# Patient Record
Sex: Female | Born: 1960
Health system: Southern US, Community
[De-identification: ages and names within clinical notes are randomized; demographics above are authoritative.]

## PROBLEM LIST (undated history)

## (undated) DIAGNOSIS — E785 Hyperlipidemia, unspecified: Secondary | ICD-10-CM

## (undated) DIAGNOSIS — J80 Acute respiratory distress syndrome: Secondary | ICD-10-CM

## (undated) HISTORY — PX: ABDOMINAL HYSTERECTOMY: SUR658

## (undated) HISTORY — DX: Hyperlipidemia, unspecified: E78.5

## (undated) HISTORY — DX: Acute respiratory distress syndrome: J80

## (undated) HISTORY — PX: CHOLECYSTECTOMY: SHX55

## (undated) HISTORY — PX: TONSILLECTOMY: SUR1361

---

## 2010-01-07 ENCOUNTER — Ambulatory Visit (HOSPITAL_COMMUNITY): Admission: RE | Admit: 2010-01-07 | Discharge: 2010-01-07 | Payer: Self-pay | Admitting: Internal Medicine

## 2015-09-29 DIAGNOSIS — M545 Low back pain: Secondary | ICD-10-CM | POA: Diagnosis not present

## 2015-09-29 DIAGNOSIS — J069 Acute upper respiratory infection, unspecified: Secondary | ICD-10-CM | POA: Diagnosis not present

## 2015-10-29 DIAGNOSIS — Z720 Tobacco use: Secondary | ICD-10-CM | POA: Diagnosis not present

## 2015-10-29 DIAGNOSIS — M545 Low back pain: Secondary | ICD-10-CM | POA: Diagnosis not present

## 2015-10-29 DIAGNOSIS — R350 Frequency of micturition: Secondary | ICD-10-CM | POA: Diagnosis not present

## 2015-10-29 DIAGNOSIS — N390 Urinary tract infection, site not specified: Secondary | ICD-10-CM | POA: Diagnosis not present

## 2015-10-29 DIAGNOSIS — F329 Major depressive disorder, single episode, unspecified: Secondary | ICD-10-CM | POA: Diagnosis not present

## 2015-11-05 DIAGNOSIS — F329 Major depressive disorder, single episode, unspecified: Secondary | ICD-10-CM | POA: Diagnosis not present

## 2015-11-07 DIAGNOSIS — Z713 Dietary counseling and surveillance: Secondary | ICD-10-CM | POA: Diagnosis not present

## 2015-11-07 DIAGNOSIS — R319 Hematuria, unspecified: Secondary | ICD-10-CM | POA: Diagnosis not present

## 2015-11-07 DIAGNOSIS — F419 Anxiety disorder, unspecified: Secondary | ICD-10-CM | POA: Diagnosis not present

## 2015-11-07 DIAGNOSIS — R350 Frequency of micturition: Secondary | ICD-10-CM | POA: Diagnosis not present

## 2015-11-07 DIAGNOSIS — Z6821 Body mass index (BMI) 21.0-21.9, adult: Secondary | ICD-10-CM | POA: Diagnosis not present

## 2015-11-07 DIAGNOSIS — R311 Benign essential microscopic hematuria: Secondary | ICD-10-CM | POA: Diagnosis not present

## 2015-11-24 DIAGNOSIS — R319 Hematuria, unspecified: Secondary | ICD-10-CM | POA: Diagnosis not present

## 2015-11-24 DIAGNOSIS — N200 Calculus of kidney: Secondary | ICD-10-CM | POA: Diagnosis not present

## 2015-11-28 DIAGNOSIS — M545 Low back pain: Secondary | ICD-10-CM | POA: Diagnosis not present

## 2015-11-28 DIAGNOSIS — R350 Frequency of micturition: Secondary | ICD-10-CM | POA: Diagnosis not present

## 2015-12-26 DIAGNOSIS — F17210 Nicotine dependence, cigarettes, uncomplicated: Secondary | ICD-10-CM | POA: Diagnosis not present

## 2015-12-26 DIAGNOSIS — M545 Low back pain: Secondary | ICD-10-CM | POA: Diagnosis not present

## 2015-12-26 DIAGNOSIS — Z720 Tobacco use: Secondary | ICD-10-CM | POA: Diagnosis not present

## 2016-01-23 DIAGNOSIS — M545 Low back pain: Secondary | ICD-10-CM | POA: Diagnosis not present

## 2016-01-23 DIAGNOSIS — F419 Anxiety disorder, unspecified: Secondary | ICD-10-CM | POA: Diagnosis not present

## 2016-01-28 DIAGNOSIS — E7800 Pure hypercholesterolemia, unspecified: Secondary | ICD-10-CM | POA: Diagnosis not present

## 2016-01-28 DIAGNOSIS — F329 Major depressive disorder, single episode, unspecified: Secondary | ICD-10-CM | POA: Diagnosis not present

## 2016-02-23 DIAGNOSIS — M545 Low back pain: Secondary | ICD-10-CM | POA: Diagnosis not present

## 2016-03-24 DIAGNOSIS — F419 Anxiety disorder, unspecified: Secondary | ICD-10-CM | POA: Diagnosis not present

## 2016-03-24 DIAGNOSIS — N390 Urinary tract infection, site not specified: Secondary | ICD-10-CM | POA: Diagnosis not present

## 2016-03-24 DIAGNOSIS — Z299 Encounter for prophylactic measures, unspecified: Secondary | ICD-10-CM | POA: Diagnosis not present

## 2016-03-24 DIAGNOSIS — M545 Low back pain: Secondary | ICD-10-CM | POA: Diagnosis not present

## 2016-03-29 DIAGNOSIS — Z1231 Encounter for screening mammogram for malignant neoplasm of breast: Secondary | ICD-10-CM | POA: Diagnosis not present

## 2016-03-31 DIAGNOSIS — Z299 Encounter for prophylactic measures, unspecified: Secondary | ICD-10-CM | POA: Diagnosis not present

## 2016-03-31 DIAGNOSIS — L0291 Cutaneous abscess, unspecified: Secondary | ICD-10-CM | POA: Diagnosis not present

## 2016-04-01 DIAGNOSIS — Z79899 Other long term (current) drug therapy: Secondary | ICD-10-CM | POA: Diagnosis not present

## 2016-04-01 DIAGNOSIS — F329 Major depressive disorder, single episode, unspecified: Secondary | ICD-10-CM | POA: Diagnosis not present

## 2016-04-01 DIAGNOSIS — F17200 Nicotine dependence, unspecified, uncomplicated: Secondary | ICD-10-CM | POA: Diagnosis not present

## 2016-04-01 DIAGNOSIS — L02511 Cutaneous abscess of right hand: Secondary | ICD-10-CM | POA: Diagnosis not present

## 2016-04-01 DIAGNOSIS — F419 Anxiety disorder, unspecified: Secondary | ICD-10-CM | POA: Diagnosis not present

## 2016-04-01 DIAGNOSIS — M7989 Other specified soft tissue disorders: Secondary | ICD-10-CM | POA: Diagnosis not present

## 2016-04-05 DIAGNOSIS — Z299 Encounter for prophylactic measures, unspecified: Secondary | ICD-10-CM | POA: Diagnosis not present

## 2016-04-05 DIAGNOSIS — L0291 Cutaneous abscess, unspecified: Secondary | ICD-10-CM | POA: Diagnosis not present

## 2016-04-23 DIAGNOSIS — M545 Low back pain: Secondary | ICD-10-CM | POA: Diagnosis not present

## 2016-04-23 DIAGNOSIS — H6980 Other specified disorders of Eustachian tube, unspecified ear: Secondary | ICD-10-CM | POA: Diagnosis not present

## 2016-04-23 DIAGNOSIS — F329 Major depressive disorder, single episode, unspecified: Secondary | ICD-10-CM | POA: Diagnosis not present

## 2016-05-24 DIAGNOSIS — H6591 Unspecified nonsuppurative otitis media, right ear: Secondary | ICD-10-CM | POA: Diagnosis not present

## 2016-05-24 DIAGNOSIS — M545 Low back pain: Secondary | ICD-10-CM | POA: Diagnosis not present

## 2016-05-24 DIAGNOSIS — Z299 Encounter for prophylactic measures, unspecified: Secondary | ICD-10-CM | POA: Diagnosis not present

## 2016-06-09 DIAGNOSIS — M545 Low back pain: Secondary | ICD-10-CM | POA: Diagnosis not present

## 2016-06-09 DIAGNOSIS — Z79899 Other long term (current) drug therapy: Secondary | ICD-10-CM | POA: Diagnosis not present

## 2016-06-09 DIAGNOSIS — M461 Sacroiliitis, not elsewhere classified: Secondary | ICD-10-CM | POA: Diagnosis not present

## 2016-06-09 DIAGNOSIS — F419 Anxiety disorder, unspecified: Secondary | ICD-10-CM | POA: Diagnosis not present

## 2016-06-09 DIAGNOSIS — M818 Other osteoporosis without current pathological fracture: Secondary | ICD-10-CM | POA: Diagnosis not present

## 2016-06-09 DIAGNOSIS — F17218 Nicotine dependence, cigarettes, with other nicotine-induced disorders: Secondary | ICD-10-CM | POA: Diagnosis not present

## 2016-06-09 DIAGNOSIS — G3184 Mild cognitive impairment, so stated: Secondary | ICD-10-CM | POA: Diagnosis not present

## 2016-06-09 DIAGNOSIS — Z79891 Long term (current) use of opiate analgesic: Secondary | ICD-10-CM | POA: Diagnosis not present

## 2016-06-09 DIAGNOSIS — F329 Major depressive disorder, single episode, unspecified: Secondary | ICD-10-CM | POA: Diagnosis not present

## 2016-06-09 DIAGNOSIS — F332 Major depressive disorder, recurrent severe without psychotic features: Secondary | ICD-10-CM | POA: Diagnosis not present

## 2016-06-09 DIAGNOSIS — M79621 Pain in right upper arm: Secondary | ICD-10-CM | POA: Diagnosis not present

## 2016-06-23 DIAGNOSIS — M461 Sacroiliitis, not elsewhere classified: Secondary | ICD-10-CM | POA: Diagnosis not present

## 2016-06-23 DIAGNOSIS — F332 Major depressive disorder, recurrent severe without psychotic features: Secondary | ICD-10-CM | POA: Diagnosis not present

## 2016-06-23 DIAGNOSIS — M545 Low back pain: Secondary | ICD-10-CM | POA: Diagnosis not present

## 2016-06-23 DIAGNOSIS — F419 Anxiety disorder, unspecified: Secondary | ICD-10-CM | POA: Diagnosis not present

## 2016-06-23 DIAGNOSIS — F17218 Nicotine dependence, cigarettes, with other nicotine-induced disorders: Secondary | ICD-10-CM | POA: Diagnosis not present

## 2016-06-23 DIAGNOSIS — Z79891 Long term (current) use of opiate analgesic: Secondary | ICD-10-CM | POA: Diagnosis not present

## 2016-06-23 DIAGNOSIS — M818 Other osteoporosis without current pathological fracture: Secondary | ICD-10-CM | POA: Diagnosis not present

## 2016-06-23 DIAGNOSIS — G3184 Mild cognitive impairment, so stated: Secondary | ICD-10-CM | POA: Diagnosis not present

## 2016-06-30 DIAGNOSIS — E7800 Pure hypercholesterolemia, unspecified: Secondary | ICD-10-CM | POA: Diagnosis not present

## 2016-06-30 DIAGNOSIS — Z1389 Encounter for screening for other disorder: Secondary | ICD-10-CM | POA: Diagnosis not present

## 2016-06-30 DIAGNOSIS — H6980 Other specified disorders of Eustachian tube, unspecified ear: Secondary | ICD-10-CM | POA: Diagnosis not present

## 2016-06-30 DIAGNOSIS — Z79899 Other long term (current) drug therapy: Secondary | ICD-10-CM | POA: Diagnosis not present

## 2016-06-30 DIAGNOSIS — Z299 Encounter for prophylactic measures, unspecified: Secondary | ICD-10-CM | POA: Diagnosis not present

## 2016-06-30 DIAGNOSIS — Z1211 Encounter for screening for malignant neoplasm of colon: Secondary | ICD-10-CM | POA: Diagnosis not present

## 2016-06-30 DIAGNOSIS — Z7189 Other specified counseling: Secondary | ICD-10-CM | POA: Diagnosis not present

## 2016-06-30 DIAGNOSIS — Z0000 Encounter for general adult medical examination without abnormal findings: Secondary | ICD-10-CM | POA: Diagnosis not present

## 2016-07-01 DIAGNOSIS — M545 Low back pain: Secondary | ICD-10-CM | POA: Diagnosis not present

## 2016-07-01 DIAGNOSIS — Z79899 Other long term (current) drug therapy: Secondary | ICD-10-CM | POA: Diagnosis not present

## 2016-07-01 DIAGNOSIS — F17218 Nicotine dependence, cigarettes, with other nicotine-induced disorders: Secondary | ICD-10-CM | POA: Diagnosis not present

## 2016-07-01 DIAGNOSIS — M461 Sacroiliitis, not elsewhere classified: Secondary | ICD-10-CM | POA: Diagnosis not present

## 2016-07-01 DIAGNOSIS — F419 Anxiety disorder, unspecified: Secondary | ICD-10-CM | POA: Diagnosis not present

## 2016-07-01 DIAGNOSIS — Z79891 Long term (current) use of opiate analgesic: Secondary | ICD-10-CM | POA: Diagnosis not present

## 2016-07-01 DIAGNOSIS — G3184 Mild cognitive impairment, so stated: Secondary | ICD-10-CM | POA: Diagnosis not present

## 2016-07-01 DIAGNOSIS — R5383 Other fatigue: Secondary | ICD-10-CM | POA: Diagnosis not present

## 2016-07-01 DIAGNOSIS — M818 Other osteoporosis without current pathological fracture: Secondary | ICD-10-CM | POA: Diagnosis not present

## 2016-07-01 DIAGNOSIS — F332 Major depressive disorder, recurrent severe without psychotic features: Secondary | ICD-10-CM | POA: Diagnosis not present

## 2016-07-01 DIAGNOSIS — Z23 Encounter for immunization: Secondary | ICD-10-CM | POA: Diagnosis not present

## 2016-07-01 DIAGNOSIS — E7800 Pure hypercholesterolemia, unspecified: Secondary | ICD-10-CM | POA: Diagnosis not present

## 2016-07-19 DIAGNOSIS — F419 Anxiety disorder, unspecified: Secondary | ICD-10-CM | POA: Diagnosis not present

## 2016-07-19 DIAGNOSIS — G3184 Mild cognitive impairment, so stated: Secondary | ICD-10-CM | POA: Diagnosis not present

## 2016-07-19 DIAGNOSIS — F17218 Nicotine dependence, cigarettes, with other nicotine-induced disorders: Secondary | ICD-10-CM | POA: Diagnosis not present

## 2016-07-19 DIAGNOSIS — Z79891 Long term (current) use of opiate analgesic: Secondary | ICD-10-CM | POA: Diagnosis not present

## 2016-07-19 DIAGNOSIS — M545 Low back pain: Secondary | ICD-10-CM | POA: Diagnosis not present

## 2016-07-19 DIAGNOSIS — F332 Major depressive disorder, recurrent severe without psychotic features: Secondary | ICD-10-CM | POA: Diagnosis not present

## 2016-07-19 DIAGNOSIS — M461 Sacroiliitis, not elsewhere classified: Secondary | ICD-10-CM | POA: Diagnosis not present

## 2016-07-19 DIAGNOSIS — M818 Other osteoporosis without current pathological fracture: Secondary | ICD-10-CM | POA: Diagnosis not present

## 2016-07-26 DIAGNOSIS — K7689 Other specified diseases of liver: Secondary | ICD-10-CM | POA: Diagnosis not present

## 2016-08-17 DIAGNOSIS — M461 Sacroiliitis, not elsewhere classified: Secondary | ICD-10-CM | POA: Diagnosis not present

## 2016-08-17 DIAGNOSIS — Z79891 Long term (current) use of opiate analgesic: Secondary | ICD-10-CM | POA: Diagnosis not present

## 2016-08-17 DIAGNOSIS — G3184 Mild cognitive impairment, so stated: Secondary | ICD-10-CM | POA: Diagnosis not present

## 2016-08-17 DIAGNOSIS — F419 Anxiety disorder, unspecified: Secondary | ICD-10-CM | POA: Diagnosis not present

## 2016-08-17 DIAGNOSIS — F17218 Nicotine dependence, cigarettes, with other nicotine-induced disorders: Secondary | ICD-10-CM | POA: Diagnosis not present

## 2016-08-17 DIAGNOSIS — F332 Major depressive disorder, recurrent severe without psychotic features: Secondary | ICD-10-CM | POA: Diagnosis not present

## 2016-08-17 DIAGNOSIS — M545 Low back pain: Secondary | ICD-10-CM | POA: Diagnosis not present

## 2016-08-17 DIAGNOSIS — M818 Other osteoporosis without current pathological fracture: Secondary | ICD-10-CM | POA: Diagnosis not present

## 2016-08-26 DIAGNOSIS — F329 Major depressive disorder, single episode, unspecified: Secondary | ICD-10-CM | POA: Diagnosis not present

## 2016-08-26 DIAGNOSIS — E7800 Pure hypercholesterolemia, unspecified: Secondary | ICD-10-CM | POA: Diagnosis not present

## 2016-09-16 DIAGNOSIS — M818 Other osteoporosis without current pathological fracture: Secondary | ICD-10-CM | POA: Diagnosis not present

## 2016-09-16 DIAGNOSIS — M545 Low back pain: Secondary | ICD-10-CM | POA: Diagnosis not present

## 2016-09-16 DIAGNOSIS — M461 Sacroiliitis, not elsewhere classified: Secondary | ICD-10-CM | POA: Diagnosis not present

## 2016-09-16 DIAGNOSIS — F419 Anxiety disorder, unspecified: Secondary | ICD-10-CM | POA: Diagnosis not present

## 2016-09-16 DIAGNOSIS — F17218 Nicotine dependence, cigarettes, with other nicotine-induced disorders: Secondary | ICD-10-CM | POA: Diagnosis not present

## 2016-09-16 DIAGNOSIS — G3184 Mild cognitive impairment, so stated: Secondary | ICD-10-CM | POA: Diagnosis not present

## 2016-09-16 DIAGNOSIS — F332 Major depressive disorder, recurrent severe without psychotic features: Secondary | ICD-10-CM | POA: Diagnosis not present

## 2016-09-16 DIAGNOSIS — Z79891 Long term (current) use of opiate analgesic: Secondary | ICD-10-CM | POA: Diagnosis not present

## 2016-09-27 DIAGNOSIS — Z299 Encounter for prophylactic measures, unspecified: Secondary | ICD-10-CM | POA: Diagnosis not present

## 2016-09-27 DIAGNOSIS — J069 Acute upper respiratory infection, unspecified: Secondary | ICD-10-CM | POA: Diagnosis not present

## 2016-09-27 DIAGNOSIS — F17210 Nicotine dependence, cigarettes, uncomplicated: Secondary | ICD-10-CM | POA: Diagnosis not present

## 2016-09-27 DIAGNOSIS — R509 Fever, unspecified: Secondary | ICD-10-CM | POA: Diagnosis not present

## 2016-10-13 DIAGNOSIS — M461 Sacroiliitis, not elsewhere classified: Secondary | ICD-10-CM | POA: Diagnosis not present

## 2016-10-13 DIAGNOSIS — M818 Other osteoporosis without current pathological fracture: Secondary | ICD-10-CM | POA: Diagnosis not present

## 2016-10-13 DIAGNOSIS — F419 Anxiety disorder, unspecified: Secondary | ICD-10-CM | POA: Diagnosis not present

## 2016-10-13 DIAGNOSIS — M545 Low back pain: Secondary | ICD-10-CM | POA: Diagnosis not present

## 2016-10-13 DIAGNOSIS — F17218 Nicotine dependence, cigarettes, with other nicotine-induced disorders: Secondary | ICD-10-CM | POA: Diagnosis not present

## 2016-10-13 DIAGNOSIS — Z79891 Long term (current) use of opiate analgesic: Secondary | ICD-10-CM | POA: Diagnosis not present

## 2016-10-13 DIAGNOSIS — F332 Major depressive disorder, recurrent severe without psychotic features: Secondary | ICD-10-CM | POA: Diagnosis not present

## 2016-10-13 DIAGNOSIS — G3184 Mild cognitive impairment, so stated: Secondary | ICD-10-CM | POA: Diagnosis not present

## 2016-10-15 DIAGNOSIS — F329 Major depressive disorder, single episode, unspecified: Secondary | ICD-10-CM | POA: Diagnosis not present

## 2016-10-15 DIAGNOSIS — E7800 Pure hypercholesterolemia, unspecified: Secondary | ICD-10-CM | POA: Diagnosis not present

## 2016-11-01 DIAGNOSIS — Z713 Dietary counseling and surveillance: Secondary | ICD-10-CM | POA: Diagnosis not present

## 2016-11-01 DIAGNOSIS — F17210 Nicotine dependence, cigarettes, uncomplicated: Secondary | ICD-10-CM | POA: Diagnosis not present

## 2016-11-01 DIAGNOSIS — Z299 Encounter for prophylactic measures, unspecified: Secondary | ICD-10-CM | POA: Diagnosis not present

## 2016-11-01 DIAGNOSIS — J40 Bronchitis, not specified as acute or chronic: Secondary | ICD-10-CM | POA: Diagnosis not present

## 2016-11-01 DIAGNOSIS — R05 Cough: Secondary | ICD-10-CM | POA: Diagnosis not present

## 2016-11-01 DIAGNOSIS — J209 Acute bronchitis, unspecified: Secondary | ICD-10-CM | POA: Diagnosis not present

## 2016-11-01 DIAGNOSIS — Z6822 Body mass index (BMI) 22.0-22.9, adult: Secondary | ICD-10-CM | POA: Diagnosis not present

## 2016-11-01 DIAGNOSIS — E7800 Pure hypercholesterolemia, unspecified: Secondary | ICD-10-CM | POA: Diagnosis not present

## 2016-11-01 DIAGNOSIS — M810 Age-related osteoporosis without current pathological fracture: Secondary | ICD-10-CM | POA: Diagnosis not present

## 2016-12-15 DIAGNOSIS — G3184 Mild cognitive impairment, so stated: Secondary | ICD-10-CM | POA: Diagnosis not present

## 2016-12-15 DIAGNOSIS — F419 Anxiety disorder, unspecified: Secondary | ICD-10-CM | POA: Diagnosis not present

## 2016-12-15 DIAGNOSIS — M545 Low back pain: Secondary | ICD-10-CM | POA: Diagnosis not present

## 2016-12-15 DIAGNOSIS — M818 Other osteoporosis without current pathological fracture: Secondary | ICD-10-CM | POA: Diagnosis not present

## 2016-12-15 DIAGNOSIS — F332 Major depressive disorder, recurrent severe without psychotic features: Secondary | ICD-10-CM | POA: Diagnosis not present

## 2016-12-15 DIAGNOSIS — Z79891 Long term (current) use of opiate analgesic: Secondary | ICD-10-CM | POA: Diagnosis not present

## 2016-12-15 DIAGNOSIS — M461 Sacroiliitis, not elsewhere classified: Secondary | ICD-10-CM | POA: Diagnosis not present

## 2016-12-15 DIAGNOSIS — F17218 Nicotine dependence, cigarettes, with other nicotine-induced disorders: Secondary | ICD-10-CM | POA: Diagnosis not present

## 2017-01-12 DIAGNOSIS — M545 Low back pain: Secondary | ICD-10-CM | POA: Diagnosis not present

## 2017-01-12 DIAGNOSIS — Z79891 Long term (current) use of opiate analgesic: Secondary | ICD-10-CM | POA: Diagnosis not present

## 2017-01-12 DIAGNOSIS — M818 Other osteoporosis without current pathological fracture: Secondary | ICD-10-CM | POA: Diagnosis not present

## 2017-01-12 DIAGNOSIS — F419 Anxiety disorder, unspecified: Secondary | ICD-10-CM | POA: Diagnosis not present

## 2017-01-12 DIAGNOSIS — M461 Sacroiliitis, not elsewhere classified: Secondary | ICD-10-CM | POA: Diagnosis not present

## 2017-01-12 DIAGNOSIS — F17218 Nicotine dependence, cigarettes, with other nicotine-induced disorders: Secondary | ICD-10-CM | POA: Diagnosis not present

## 2017-01-12 DIAGNOSIS — G3184 Mild cognitive impairment, so stated: Secondary | ICD-10-CM | POA: Diagnosis not present

## 2017-01-12 DIAGNOSIS — F332 Major depressive disorder, recurrent severe without psychotic features: Secondary | ICD-10-CM | POA: Diagnosis not present

## 2017-01-14 DIAGNOSIS — Z299 Encounter for prophylactic measures, unspecified: Secondary | ICD-10-CM | POA: Diagnosis not present

## 2017-01-14 DIAGNOSIS — F329 Major depressive disorder, single episode, unspecified: Secondary | ICD-10-CM | POA: Diagnosis not present

## 2017-01-14 DIAGNOSIS — Z6822 Body mass index (BMI) 22.0-22.9, adult: Secondary | ICD-10-CM | POA: Diagnosis not present

## 2017-01-14 DIAGNOSIS — J069 Acute upper respiratory infection, unspecified: Secondary | ICD-10-CM | POA: Diagnosis not present

## 2017-01-14 DIAGNOSIS — F17210 Nicotine dependence, cigarettes, uncomplicated: Secondary | ICD-10-CM | POA: Diagnosis not present

## 2017-01-14 DIAGNOSIS — F419 Anxiety disorder, unspecified: Secondary | ICD-10-CM | POA: Diagnosis not present

## 2017-01-21 DIAGNOSIS — F17218 Nicotine dependence, cigarettes, with other nicotine-induced disorders: Secondary | ICD-10-CM | POA: Diagnosis not present

## 2017-01-21 DIAGNOSIS — M545 Low back pain: Secondary | ICD-10-CM | POA: Diagnosis not present

## 2017-01-21 DIAGNOSIS — M461 Sacroiliitis, not elsewhere classified: Secondary | ICD-10-CM | POA: Diagnosis not present

## 2017-01-21 DIAGNOSIS — Z79891 Long term (current) use of opiate analgesic: Secondary | ICD-10-CM | POA: Diagnosis not present

## 2017-01-21 DIAGNOSIS — G3184 Mild cognitive impairment, so stated: Secondary | ICD-10-CM | POA: Diagnosis not present

## 2017-01-21 DIAGNOSIS — F419 Anxiety disorder, unspecified: Secondary | ICD-10-CM | POA: Diagnosis not present

## 2017-01-21 DIAGNOSIS — M818 Other osteoporosis without current pathological fracture: Secondary | ICD-10-CM | POA: Diagnosis not present

## 2017-01-21 DIAGNOSIS — F332 Major depressive disorder, recurrent severe without psychotic features: Secondary | ICD-10-CM | POA: Diagnosis not present

## 2017-01-26 ENCOUNTER — Other Ambulatory Visit (HOSPITAL_COMMUNITY): Payer: Self-pay | Admitting: Neurology

## 2017-01-26 ENCOUNTER — Ambulatory Visit (HOSPITAL_COMMUNITY)
Admission: RE | Admit: 2017-01-26 | Discharge: 2017-01-26 | Disposition: A | Discharge status: Home / Self Care | Payer: Medicare Other | Source: Ambulatory Visit | Attending: Neurology | Admitting: Neurology

## 2017-01-26 DIAGNOSIS — W19XXXA Unspecified fall, initial encounter: Secondary | ICD-10-CM

## 2017-01-26 DIAGNOSIS — M47897 Other spondylosis, lumbosacral region: Secondary | ICD-10-CM | POA: Diagnosis not present

## 2017-01-26 DIAGNOSIS — M545 Low back pain: Secondary | ICD-10-CM | POA: Diagnosis not present

## 2017-02-11 DIAGNOSIS — F329 Major depressive disorder, single episode, unspecified: Secondary | ICD-10-CM | POA: Diagnosis not present

## 2017-02-11 DIAGNOSIS — E7800 Pure hypercholesterolemia, unspecified: Secondary | ICD-10-CM | POA: Diagnosis not present

## 2017-03-10 DIAGNOSIS — F419 Anxiety disorder, unspecified: Secondary | ICD-10-CM | POA: Diagnosis not present

## 2017-03-10 DIAGNOSIS — F332 Major depressive disorder, recurrent severe without psychotic features: Secondary | ICD-10-CM | POA: Diagnosis not present

## 2017-03-10 DIAGNOSIS — F17218 Nicotine dependence, cigarettes, with other nicotine-induced disorders: Secondary | ICD-10-CM | POA: Diagnosis not present

## 2017-03-10 DIAGNOSIS — M545 Low back pain: Secondary | ICD-10-CM | POA: Diagnosis not present

## 2017-03-10 DIAGNOSIS — G3184 Mild cognitive impairment, so stated: Secondary | ICD-10-CM | POA: Diagnosis not present

## 2017-03-10 DIAGNOSIS — M461 Sacroiliitis, not elsewhere classified: Secondary | ICD-10-CM | POA: Diagnosis not present

## 2017-03-10 DIAGNOSIS — Z79891 Long term (current) use of opiate analgesic: Secondary | ICD-10-CM | POA: Diagnosis not present

## 2017-03-10 DIAGNOSIS — M818 Other osteoporosis without current pathological fracture: Secondary | ICD-10-CM | POA: Diagnosis not present

## 2017-03-30 DIAGNOSIS — F329 Major depressive disorder, single episode, unspecified: Secondary | ICD-10-CM | POA: Diagnosis not present

## 2017-03-30 DIAGNOSIS — E7800 Pure hypercholesterolemia, unspecified: Secondary | ICD-10-CM | POA: Diagnosis not present

## 2017-05-09 DIAGNOSIS — Z713 Dietary counseling and surveillance: Secondary | ICD-10-CM | POA: Diagnosis not present

## 2017-05-09 DIAGNOSIS — Z299 Encounter for prophylactic measures, unspecified: Secondary | ICD-10-CM | POA: Diagnosis not present

## 2017-05-09 DIAGNOSIS — H6691 Otitis media, unspecified, right ear: Secondary | ICD-10-CM | POA: Diagnosis not present

## 2017-05-09 DIAGNOSIS — Z6823 Body mass index (BMI) 23.0-23.9, adult: Secondary | ICD-10-CM | POA: Diagnosis not present

## 2017-05-10 DIAGNOSIS — M461 Sacroiliitis, not elsewhere classified: Secondary | ICD-10-CM | POA: Diagnosis not present

## 2017-05-10 DIAGNOSIS — Z79891 Long term (current) use of opiate analgesic: Secondary | ICD-10-CM | POA: Diagnosis not present

## 2017-05-10 DIAGNOSIS — G3184 Mild cognitive impairment, so stated: Secondary | ICD-10-CM | POA: Diagnosis not present

## 2017-05-10 DIAGNOSIS — M545 Low back pain: Secondary | ICD-10-CM | POA: Diagnosis not present

## 2017-06-16 DIAGNOSIS — M461 Sacroiliitis, not elsewhere classified: Secondary | ICD-10-CM | POA: Diagnosis not present

## 2017-06-16 DIAGNOSIS — M545 Low back pain: Secondary | ICD-10-CM | POA: Diagnosis not present

## 2017-07-13 DIAGNOSIS — Z299 Encounter for prophylactic measures, unspecified: Secondary | ICD-10-CM | POA: Diagnosis not present

## 2017-07-13 DIAGNOSIS — Z7189 Other specified counseling: Secondary | ICD-10-CM | POA: Diagnosis not present

## 2017-07-13 DIAGNOSIS — Z1331 Encounter for screening for depression: Secondary | ICD-10-CM | POA: Diagnosis not present

## 2017-07-13 DIAGNOSIS — Z0000 Encounter for general adult medical examination without abnormal findings: Secondary | ICD-10-CM | POA: Diagnosis not present

## 2017-07-13 DIAGNOSIS — Z1339 Encounter for screening examination for other mental health and behavioral disorders: Secondary | ICD-10-CM | POA: Diagnosis not present

## 2017-07-13 DIAGNOSIS — Z1211 Encounter for screening for malignant neoplasm of colon: Secondary | ICD-10-CM | POA: Diagnosis not present

## 2017-07-13 DIAGNOSIS — Z6824 Body mass index (BMI) 24.0-24.9, adult: Secondary | ICD-10-CM | POA: Diagnosis not present

## 2017-07-19 DIAGNOSIS — F329 Major depressive disorder, single episode, unspecified: Secondary | ICD-10-CM | POA: Diagnosis not present

## 2017-07-19 DIAGNOSIS — E7800 Pure hypercholesterolemia, unspecified: Secondary | ICD-10-CM | POA: Diagnosis not present

## 2017-07-25 DIAGNOSIS — Z79899 Other long term (current) drug therapy: Secondary | ICD-10-CM | POA: Diagnosis not present

## 2017-07-25 DIAGNOSIS — R5383 Other fatigue: Secondary | ICD-10-CM | POA: Diagnosis not present

## 2017-07-25 DIAGNOSIS — E7800 Pure hypercholesterolemia, unspecified: Secondary | ICD-10-CM | POA: Diagnosis not present

## 2017-07-25 DIAGNOSIS — F419 Anxiety disorder, unspecified: Secondary | ICD-10-CM | POA: Diagnosis not present

## 2017-08-09 DIAGNOSIS — G894 Chronic pain syndrome: Secondary | ICD-10-CM | POA: Diagnosis not present

## 2017-08-09 DIAGNOSIS — M545 Low back pain: Secondary | ICD-10-CM | POA: Diagnosis not present

## 2017-08-09 DIAGNOSIS — M461 Sacroiliitis, not elsewhere classified: Secondary | ICD-10-CM | POA: Diagnosis not present

## 2017-08-09 DIAGNOSIS — Z79891 Long term (current) use of opiate analgesic: Secondary | ICD-10-CM | POA: Diagnosis not present
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
## 2017-09-27 ENCOUNTER — Ambulatory Visit (INDEPENDENT_AMBULATORY_CARE_PROVIDER_SITE_OTHER): Payer: Medicare Other | Admitting: Psychiatry

## 2017-09-27 ENCOUNTER — Encounter (HOSPITAL_COMMUNITY): Payer: Self-pay | Admitting: Psychiatry

## 2017-09-27 ENCOUNTER — Encounter (INDEPENDENT_AMBULATORY_CARE_PROVIDER_SITE_OTHER): Payer: Self-pay

## 2017-09-27 VITALS — BP 106/69 | HR 95 | Ht 63.0 in | Wt 139.0 lb

## 2017-09-27 DIAGNOSIS — Z79899 Other long term (current) drug therapy: Secondary | ICD-10-CM

## 2017-09-27 DIAGNOSIS — F17200 Nicotine dependence, unspecified, uncomplicated: Secondary | ICD-10-CM

## 2017-09-27 DIAGNOSIS — E785 Hyperlipidemia, unspecified: Secondary | ICD-10-CM

## 2017-09-27 DIAGNOSIS — F419 Anxiety disorder, unspecified: Secondary | ICD-10-CM

## 2017-09-27 DIAGNOSIS — F321 Major depressive disorder, single episode, moderate: Secondary | ICD-10-CM | POA: Insufficient documentation

## 2017-09-27 DIAGNOSIS — M549 Dorsalgia, unspecified: Secondary | ICD-10-CM

## 2017-09-27 MED ORDER — SERTRALINE HCL 50 MG PO TABS: ORAL_TABLET | ORAL | 0 refills | Status: DC

## 2017-09-27 NOTE — Patient Instructions (Signed)
1. Decrease paxil 20 mg daily for one week, then discontinue 2. Start sertraline 25 mg daily for one week, then 50 mg daily  3. Decrease Xanax 0.25 mg daily as needed for anxiety  4. Return to clinic in one month for 30 mins 5. Referral to therapy

## 2017-10-05 DIAGNOSIS — M545 Low back pain: Secondary | ICD-10-CM | POA: Diagnosis not present

## 2017-10-05 DIAGNOSIS — F329 Major depressive disorder, single episode, unspecified: Secondary | ICD-10-CM | POA: Diagnosis not present

## 2017-10-05 DIAGNOSIS — M461 Sacroiliitis, not elsewhere classified: Secondary | ICD-10-CM | POA: Diagnosis not present

## 2017-10-05 DIAGNOSIS — E7800 Pure hypercholesterolemia, unspecified: Secondary | ICD-10-CM | POA: Diagnosis not present

## 2017-10-05 DIAGNOSIS — G894 Chronic pain syndrome: Secondary | ICD-10-CM | POA: Diagnosis not present

## 2017-10-05 DIAGNOSIS — Z79891 Long term (current) use of opiate analgesic: Secondary | ICD-10-CM | POA: Diagnosis not present

## 2017-10-25 NOTE — Progress Notes (Signed)
BH MD/PA/NP OP Progress Note  10/31/2017 4:17 PM Paula AldermanMelissa Bailey  MRN:  [id number]  Chief Complaint:  Chief Complaint    Depression; Follow-up     HPI:  Patient presents for follow up appointment for depression. She states that she notices she does not have crying spells anymore. She has started to walk outside more than before. She started to do cleaning. She still not able to breathe herself due to low motivation. She denies any side effects from sertraline. She has occasional insomnia. She has more energy and motivation. She eats well. She feels less depressed. She denies SI. She feels anxious and tense at times. She denies panic attacks. She takes Xanax 0.25 mg twice a day for anxiety. Although she states that her daughter told her that she is yelling at her daughter, she attribute it to her hearing loss and denies irritability.    Her sister presents to the interview.  She agrees to what Paula KaufmannMelissa says. Patient is doing better and is not as somnolent as before. She has started to do cleaning the house and appears to be doing better.   Wt Readings from Last 3 Encounters:  10/31/17 142 lb (64.4 kg)  09/27/17 139 lb (63 kg)    Per PMP,  Xanax filled on 10/30/2017   Visit Diagnosis:    ICD-10-CM   1. Current moderate episode of major depressive disorder without prior episode (HCC) F32.1     Past Psychiatric History:  I have reviewed the patient's psychiatry history in detail and updated the patient record. Outpatient: denies Psychiatry admission: denies Previous suicide attempt:  Past trials of medication: Paxil, duloxetine (drowsiness), Wellbutrin, Xanax History of violence:   Past Medical History:  Past Medical History:  Diagnosis Date  . Dyslipidemia    No past surgical history on file.  Family Psychiatric History: I have reviewed the patient's family history in detail and updated the patient record.  Family History: No family history on file.  Social History:  Social  History   Socioeconomic History  . Marital status: Divorced    Spouse name: None  . Number of children: None  . Years of education: None  . Highest education level: None  Social Needs  . Financial resource strain: None  . Food insecurity - worry: None  . Food insecurity - inability: None  . Transportation needs - medical: None  . Transportation needs - non-medical: None  Occupational History  . None  Tobacco Use  . Smoking status: Current Every Day Smoker  . Smokeless tobacco: Never Used  Substance and Sexual Activity  . Alcohol use: No    Frequency: Never  . Drug use: None  . Sexual activity: None  Other Topics Concern  . None  Social History Narrative  . None    Allergies: No Known Allergies  Metabolic Disorder Labs: No results found for: HGBA1C, MPG No results found for: PROLACTIN No results found for: CHOL, TRIG, HDL, CHOLHDL, VLDL, LDLCALC No results found for: TSH  Therapeutic Level Labs: No results found for: LITHIUM No results found for: VALPROATE No components found for:  CBMZ  Current Medications: Current Outpatient Medications  Medication Sig Dispense Refill  . alendronate (FOSAMAX) 70 MG tablet Take 70 mg by mouth once a week.  6  . ALPRAZolam (XANAX) 0.25 MG tablet Take 0.25 mg by mouth every 12 (twelve) hours.  0  . fluticasone (FLONASE) 50 MCG/ACT nasal spray Place 2 sprays into both nostrils daily.  4  . gabapentin (NEURONTIN)  300 MG capsule TAKE ONE CAPSULE THREE TIMES DAILY  2  . oxyCODONE-acetaminophen (PERCOCET) 7.5-325 MG tablet Take 1 tablet by mouth every 8 (eight) hours as needed.  0  . sertraline (ZOLOFT) 100 MG tablet Take 1 tablet (100 mg total) by mouth daily. 30 tablet 0  . VENTOLIN HFA 108 (90 Base) MCG/ACT inhaler INHALE 1-2 PUFFS FOUR TIMES DAILY AS NEEDED  12   No current facility-administered medications for this visit.      Musculoskeletal: Strength & Muscle Tone: within normal limits Gait & Station: normal Patient leans:  N/A  Psychiatric Specialty Exam: Review of Systems  Psychiatric/Behavioral: Positive for depression. Negative for hallucinations, memory loss, substance abuse and suicidal ideas. The patient is nervous/anxious and has insomnia.   All other systems reviewed and are negative.   Blood pressure 113/74, pulse 78, height 5\' 3"  (1.6 m), weight 142 lb (64.4 kg), SpO2 97 %.Body mass index is 25.15 kg/m.  General Appearance: Fairly Groomed  Eye Contact:  Good  Speech:  Clear and Coherent  Volume:  Normal  Mood:  "better"  Affect:  slightly down but brighter  Thought Process:  Coherent and Goal Directed  Orientation:  Full (Time, Place, and Person)  Thought Content: Logical   Suicidal Thoughts:  No  Homicidal Thoughts:  No  Memory:  Immediate;   Good Recent;   Good Remote;   Good  Judgement:  Good  Insight:  Fair  Psychomotor Activity:  Normal  Concentration:  Concentration: Good and Attention Span: Good  Recall:  Good  Fund of Knowledge: Good  Language: Good  Akathisia:  No  Handed:  Right  AIMS (if indicated): N/A  Assets:  Communication Skills Desire for Improvement  ADL's:  Intact  Cognition: WNL  Sleep:  Fair   Screenings:   Assessment and Plan:  Rosiland Sen is a 57 y.o. year old female with a history of depression, r/o intellectual disability, dyslipidemia , who presents for follow up appointment for Current moderate episode of major depressive disorder without prior episode (HCC)   # MDD, moderate, single episode without psychotic features There has been significant improvement in neurovegetative symptoms and rumination since switching from Paxil to sertraline. Will up titrating sertraline to target residual symptoms of depression. Discussed potential side effect of nausea and vomiting. Discussed behavioral activation. She is encouraged to continue to see a therapist.   # r/o intellectual disability Patient had been in special classes since child. ADL independent  except that she has no motivation to bath. Will obtain record from disability evaluation.   Plan 1. Increase sertraline 100 mg at night  2  Return to clinic in one month for 15 mins 3. Bring disability evaluation at the next appointment  (She has been on Xanax 0.25 mg BID as needed for anxiety) - TSH pending  The patient demonstrates the following risk factors for suicide: Chronic risk factors for suicide include: psychiatric disorder of depression. Acute risk factors for suicide include: unemployment. Protective factors for this patient include: positive social support, coping skills and hope for the future. Considering these factors, the overall suicide risk at this point appears to be low. Patient is appropriate for outpatient follow up.  The duration of this appointment visit was 30 minutes of face-to-face time with the patient.  Greater than 50% of this time was spent in counseling, explanation of  diagnosis, planning of further management, and coordination of care.  [name], MD 10/31/2017, 4:17 PM

## 2017-10-26 ENCOUNTER — Ambulatory Visit (INDEPENDENT_AMBULATORY_CARE_PROVIDER_SITE_OTHER): Payer: Medicare Other | Admitting: Licensed Clinical Social Worker

## 2017-10-26 DIAGNOSIS — F321 Major depressive disorder, single episode, moderate: Secondary | ICD-10-CM

## 2017-10-27 ENCOUNTER — Encounter (HOSPITAL_COMMUNITY): Payer: Self-pay | Admitting: Licensed Clinical Social Worker

## 2017-10-27 NOTE — Progress Notes (Signed)
Comprehensive Clinical Assessment (CCA) Note  10/27/2017 Paula Bailey [id number]  Visit Diagnosis:      ICD-10-CM   1. Current moderate episode of major depressive disorder without prior episode (HCC) F32.1       CCA Part One  Part One has been completed on paper by the patient.  (See scanned document in Chart Review)  CCA Part Two A  Intake/Chief Complaint:  CCA Intake With Chief Complaint CCA Part Two Date: 10/26/17 CCA Part Two Time: 1516 Chief Complaint/Presenting Problem: Depression and anxiety(Patient is a 57 year old Caucasian female that presents oriented x5 (person, place, situation, time and object), alert, casually dressed, appropriately groomed, average height, average weight, depressed and cooperative) Patients Currently Reported Symptoms/Problems: Mood: doesn't want to do things, crying, isolates, lack of hygiene, low energy, sometimes gtting up during the night, weight gain 10-12 lbs, occasional episodes of tearfulness, some irritability,  Anxiety:  feels like people are looking at her, feels nervous and worried,, used to be a hoardeer  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 2018,  symptoms occur daily, symptoms are moderate   Mental Health Symptoms Depression:  Depression: Change in energy/activity, Irritability, Sleep (too much or little), Tearfulness, Weight gain/loss  Mania:  Mania: N/A  Anxiety:   Anxiety: Worrying, Tension, Restlessness, Fatigue  Psychosis:  Psychosis: N/A  Trauma:  Trauma: N/A  Obsessions:  Obsessions: N/A  Compulsions:   Compulsions: N/A  Inattention:  Inattention: N/A  Hyperactivity/Impulsivity:  Hyperactivity/Impulsivity: N/A  Oppositional/Defiant Behaviors:  Oppositional/Defiant Behaviors: N/A  Borderline Personality:  Emotional Irregularity: N/A  Other Mood/Personality Symptoms:  Other Mood/Personality Symtpoms: None    Mental Status Exam Appearance and self-care  Stature:  Stature: Average  Weight:  Weight: Average weight  Clothing:  Clothing: Casual  Grooming:  Grooming: Normal  Cosmetic use:  Cosmetic Use: Age appropriate  Posture/gait:  Posture/Gait: Normal  Motor activity:  Motor Activity: Not Remarkable  Sensorium  Attention:  Attention: Normal  Concentration:  Concentration: Normal  Orientation:  Orientation: X5  Recall/memory:  Recall/Memory: Normal  Affect and Mood  Affect:  Affect: Depressed  Mood:  Mood: Depressed  Relating  Eye contact:  Eye Contact: Normal  Facial expression:  Facial Expression: Depressed  Attitude toward examiner:  Attitude Toward Examiner: Cooperative  Thought and Language  Speech flow: Speech Flow: Normal  Thought content:  Thought Content: Appropriate to mood and circumstances  Preoccupation:  Preoccupations: (None)  Hallucinations:  Hallucinations: (None)  Organization:   Logical   Company secretary of Knowledge:  Fund of Knowledge: Average  Intelligence:  Intelligence: Average  Abstraction:  Abstraction: Normal  Judgement:  Judgement: Normal  Reality Testing:  Reality Testing: Adequate  Insight:  Insight: Good  Decision Making:  Decision Making: Normal  Social Functioning  Social Maturity:  Social Maturity: Isolates  Social Judgement:  Social Judgement: Normal  Stress  Stressors:  Stressors: Transitions  Coping Ability:  Coping Ability: Building surveyor Deficits:   Presenter, broadcasting, energy, movtivation  Supports:   Family    Family and Psychosocial History: Family history Marital status: Divorced Divorced, when?: 2015 What types of issues  is patient dealing with in the relationship?:  None Additional relationship information: 2 marriages  Are you sexually active?: No What is your sexual orientation?: Heterosexual Has your sexual activity been affected by drugs, alcohol, medication, or emotional stress?: Emotional stress  Does patient have children?: Yes How many children?: 2 How is patient's relationship with their children?: Ok relationship   Childhood History:  Childhood History By whom was/is the patient raised?: Mother/father and step-parent Additional childhood history information: Parents passed away when she was a young adult. Good child hood Description of patient's relationship with caregiver when they were a child: Mother: Good relationship, Father: good relationship Patient's description of current relationship with people who raised him/her: Mother: Deceased, Father: decased  How were you disciplined when you got in trouble as a child/adolescent?: Grounded  Does patient have siblings?: Yes Number of Siblings: 2 Description of patient's current relationship with siblings: Good relationship with siblings  Did patient suffer any verbal/emotional/physical/sexual abuse as a child?: No Did patient suffer from severe childhood neglect?: No Has patient ever been sexually abused/assaulted/raped as an adolescent or adult?: No Was the patient ever a victim of a crime or a disaster?: No Witnessed domestic violence?: No Has patient been effected by domestic violence as an adult?: No  CCA Part Two B  Employment/Work Situation: Employment / Work Psychologist, occupationalituation Employment situation: On disability Why is patient on disability: Back, intellectual disability How long has patient been on disability: 1925 yeas  Patient's job has been impacted by current illness: No What is the longest time patient has a held a job?: 5 years Where was the patient employed at that time?: [employer]  Has patient ever been in the [employer] and Companymilitary?: No Has  patient ever served in combat?: No Did You Receive Any Psychiatric Treatment/Services While in Equities traderthe Military?: No Are There Guns or Other Weapons in Your Home?: No  Education: Engineer, civil (consulting)ducation School Currently Attending: N/A: Adult  Last Grade Completed: 12 Name of High School: [employer]  Did Garment/textile technologistYou Graduate From [employer]?: Yes Did Theme park managerYou Attend College?: No Did Designer, television/film setYou Attend Graduate School?: No Did You Have Any Special Interests In School?: Home [employer]  Did You Have An Individualized Education Program (IIEP): Yes(Reading, Math, ETC) Did You Have Any Difficulty At School?: No  Religion: Religion/Spirituality Are You A Religious Person?: No How Might This Affect Treatment?: No impact  Leisure/Recreation: Leisure / Recreation Leisure and Hobbies: Read books and magazines   Exercise/Diet: Exercise/Diet Do You Exercise?: No Have You Gained or Lost A Significant Amount of Weight in the Past Six Months?: Yes-Gained Number of Pounds Gained: 15 Do You Follow a Special Diet?: No Do You Have Any Trouble Sleeping?: No(Takes naps during the day)  CCA Part Two C  Alcohol/Drug Use: Alcohol / Drug Use Pain Medications: See patient record Prescriptions: See patient record Over the Counter: See patient record  History of alcohol / drug use?: No history of alcohol / drug abuse                      CCA Part Three  ASAM's:  Six Dimensions of Multidimensional Assessment  Dimension 1:  Acute Intoxication and/or Withdrawal Potential:  Dimension 1:  Comments: None  Dimension 2:  Biomedical Conditions and Complications:  Dimension 2:  Comments: None  Dimension 3:  Emotional, Behavioral, or Cognitive Conditions and Complications:  Dimension 3:  Comments: None  Dimension 4:  Readiness to Change:  Dimension 4:  Comments: None  Dimension 5:  Relapse, Continued use, or Continued Problem  Potential:  Dimension 5:  Comments: Noen  Dimension 6:  Recovery/Living Environment:  Dimension 6:   Recovery/Living Environment Comments: None   Substance use Disorder (SUD)    Social Function:  Social Functioning Social Maturity: Isolates Social Judgement: Normal  Stress:  Stress Stressors: Transitions Coping Ability: Overwhelmed Patient Takes Medications The Way The Doctor Instructed?: Yes Priority Risk: Low Acuity  Risk Assessment- Self-Harm Potential: Risk Assessment For Self-Harm Potential Thoughts of Self-Harm: No current thoughts Method: No plan Availability of Means: No access/NA  Risk Assessment -Dangerous to Others Potential: Risk Assessment For Dangerous to Others Potential Method: No Plan Availability of Means: No access or NA Intent: Vague intent or NA Notification Required: No need or identified person  DSM5 Diagnoses: Patient Active Problem List   Diagnosis Date Noted  . Current moderate episode of major depressive disorder without prior episode (HCC) 09/27/2017    Patient Centered Plan: Patient is on the following Treatment Plan(s):  Depression  Recommendations for Services/Supports/Treatments: Recommendations for Services/Supports/Treatments Recommendations For Services/Supports/Treatments: Individual Therapy, Medication Management  Treatment Plan Summary: OP Treatment Plan Summary: [place] will reduce symptoms of depression as evidenced by "taking baths, get happy, get back to normal" for 5 out of 7 days for 60 days.    Patient is a 57 year old Caucasian female that presents oriented x5 (person, place, situation, time and object), alert, casually dressed, appropriately groomed, average height, average weight, depressed and cooperative for an assessment on a referral from Dr. Vanetta Shawl to address mood. Patient has minimal history of medical history. Patient has a history of mental health treatment including outpatient therapy and medication management. Patient denies symptoms of mania. She denies suicidal and homicidal ideations. Patient denies psychosis  including auditory and visual hallucinations. Patient denies substance abuse. Patient denies a history of elopement. Patient is at low risk for lethality at this time. Patient would benefit from outpatient therapy with a CBT approach 1-4 times a month to address mood. Patient would benefit from medication management to manage mood.   Referrals to Alternative Service(s): Referred to Alternative Service(s):   Place:   Date:   Time:    Referred to Alternative Service(s):   Place:   Date:   Time:    Referred to Alternative Service(s):   Place:   Date:   Time:    Referred to Alternative Service(s):   Place:   Date:   Time:     Bynum Bellows, LCSW

## 2017-10-31 ENCOUNTER — Ambulatory Visit (INDEPENDENT_AMBULATORY_CARE_PROVIDER_SITE_OTHER): Payer: Medicare Other | Admitting: Psychiatry

## 2017-10-31 ENCOUNTER — Encounter (HOSPITAL_COMMUNITY): Payer: Self-pay | Admitting: Psychiatry

## 2017-10-31 VITALS — BP 113/74 | HR 78 | Ht 63.0 in | Wt 142.0 lb

## 2017-10-31 DIAGNOSIS — F419 Anxiety disorder, unspecified: Secondary | ICD-10-CM

## 2017-10-31 DIAGNOSIS — F17210 Nicotine dependence, cigarettes, uncomplicated: Secondary | ICD-10-CM

## 2017-10-31 DIAGNOSIS — G4700 Insomnia, unspecified: Secondary | ICD-10-CM

## 2017-10-31 DIAGNOSIS — R450 Nervousness: Secondary | ICD-10-CM

## 2017-10-31 DIAGNOSIS — F321 Major depressive disorder, single episode, moderate: Secondary | ICD-10-CM

## 2017-10-31 MED ORDER — SERTRALINE HCL 100 MG PO TABS: 100.0000 mg | ORAL_TABLET | Freq: Every day | ORAL | 0 refills | Status: DC

## 2017-10-31 NOTE — Patient Instructions (Signed)
1. Increase sertraline 100 mg at night  2  Return to clinic in one month for 15 mins 3. Bring disability evaluation at the next appointment

## 2017-11-25 ENCOUNTER — Ambulatory Visit (HOSPITAL_COMMUNITY): Payer: Self-pay | Admitting: Licensed Clinical Social Worker

## 2017-11-29 ENCOUNTER — Other Ambulatory Visit (HOSPITAL_COMMUNITY): Payer: Self-pay | Admitting: Psychiatry

## 2017-11-29 MED ORDER — SERTRALINE HCL 100 MG PO TABS: 100.0000 mg | ORAL_TABLET | Freq: Every day | ORAL | 0 refills | Status: DC

## 2017-11-29 NOTE — Progress Notes (Unsigned)
BH MD/PA/NP OP Progress Note  11/29/2017 11:54 AM Paula Bailey  MRN:  [id number]  Chief Complaint:  HPI: *** Visit Diagnosis: No diagnosis found.  Past Psychiatric History:  I have reviewed the patient's psychiatry history in detail and updated the patient record. Outpatient:denies Psychiatry admission:denies Previous suicide attempt:  Past trials of medication:Paxil, duloxetine (drowsiness), Wellbutrin, Xanax History of violence:   Past Medical History:  Past Medical History:  Diagnosis Date  . Dyslipidemia    No past surgical history on file.  Family Psychiatric History: I have reviewed the patient's family history in detail and updated the patient record.  Family History: No family history on file.  Social History:  Social History   Socioeconomic History  . Marital status: Divorced    Spouse name: Not on file  . Number of children: Not on file  . Years of education: Not on file  . Highest education level: Not on file  Occupational History  . Not on file  Social Needs  . Financial resource strain: Not on file  . Food insecurity:    Worry: Not on file    Inability: Not on file  . Transportation needs:    Medical: Not on file    Non-medical: Not on file  Tobacco Use  . Smoking status: Current Every Day Smoker  . Smokeless tobacco: Never Used  Substance and Sexual Activity  . Alcohol use: No    Frequency: Never  . Drug use: Not on file  . Sexual activity: Not on file  Lifestyle  . Physical activity:    Days per week: Not on file    Minutes per session: Not on file  . Stress: Not on file  Relationships  . Social connections:    Talks on phone: Not on file    Gets together: Not on file    Attends religious service: Not on file    Active member of club or organization: Not on file    Attends meetings of clubs or organizations: Not on file    Relationship status: Not on file  Other Topics Concern  . Not on file  Social History Narrative  . Not on  file    Allergies: No Known Allergies  Metabolic Disorder Labs: No results found for: HGBA1C, MPG No results found for: PROLACTIN No results found for: CHOL, TRIG, HDL, CHOLHDL, VLDL, LDLCALC No results found for: TSH  Therapeutic Level Labs: No results found for: LITHIUM No results found for: VALPROATE No components found for:  CBMZ  Current Medications: Current Outpatient Medications  Medication Sig Dispense Refill  . alendronate (FOSAMAX) 70 MG tablet Take 70 mg by mouth once a week.  6  . ALPRAZolam (XANAX) 0.25 MG tablet Take 0.25 mg by mouth every 12 (twelve) hours.  0  . fluticasone (FLONASE) 50 MCG/ACT nasal spray Place 2 sprays into both nostrils daily.  4  . gabapentin (NEURONTIN) 300 MG capsule TAKE ONE CAPSULE THREE TIMES DAILY  2  . oxyCODONE-acetaminophen (PERCOCET) 7.5-325 MG tablet Take 1 tablet by mouth every 8 (eight) hours as needed.  0  . sertraline (ZOLOFT) 100 MG tablet Take 1 tablet (100 mg total) by mouth daily. 30 tablet 0  . VENTOLIN HFA 108 (90 Base) MCG/ACT inhaler INHALE 1-2 PUFFS FOUR TIMES DAILY AS NEEDED  12   No current facility-administered medications for this visit.      Musculoskeletal: Strength & Muscle Tone: within normal limits Gait & Station: normal Patient leans: N/A  Psychiatric Specialty Exam:  ROS  There were no vitals taken for this visit.There is no height or weight on file to calculate BMI.  General Appearance: Fairly Groomed  Eye Contact:  Good  Speech:  Clear and Coherent  Volume:  Normal  Mood:  {BHH MOOD:22306}  Affect:  {Affect (PAA):22687}  Thought Process:  Coherent and Goal Directed  Orientation:  Full (Time, Place, and Person)  Thought Content: Logical   Suicidal Thoughts:  {ST/HT (PAA):22692}  Homicidal Thoughts:  {ST/HT (PAA):22692}  Memory:  Immediate;   Good Recent;   Good Remote;   Good  Judgement:  {Judgement (PAA):22694}  Insight:  {Insight (PAA):22695}  Psychomotor Activity:  Normal  Concentration:   Concentration: Good and Attention Span: Good  Recall:  Good  Fund of Knowledge: Good  Language: Good  Akathisia:  No  Handed:  Right  AIMS (if indicated): not done  Assets:  Communication Skills Desire for Improvement  ADL's:  Intact  Cognition: WNL  Sleep:  {BHH GOOD/FAIR/POOR:22877}   Screenings:   Assessment and Plan:  Paula Bailey is a 57 y.o. year old female with a history of depression, r/o intellectual disability, dylipidemia , who presents for follow up appointment for No diagnosis found.  # MDD, moderate, single episode without psychotic features  There has been significant improvement in neurovegetative symptoms and rumination since switching from Paxil to sertraline. Will up titrating sertraline to target residual symptoms of depression. Discussed potential side effect of nausea and vomiting. Discussed behavioral activation. She is encouraged to continue to see a therapist.   # r/o intellectual disability Patient had been in special classes since child. ADL independent except that she has no motivation to bath. Will obtain record from disability evaluation.   Plan 1. Increase sertraline 100 mg at night  2  Return to clinic in one month for 15 mins 3. Bring disability evaluation at the next appointment  (She has been on Xanax 0.25 mg BID as needed for anxiety) - TSH pending  The patient demonstrates the following risk factors for suicide: Chronic risk factors for suicide include:psychiatric disorder ofdepression. Acute risk factorsfor suicide include: unemployment. Protective factorsfor this patient include: positive social support, coping skills and hope for the future. Considering these factors, the overall suicide risk at this point appears to below. Patientisappropriate for outpatient follow up.    Neysa Hottereina Shai Mckenzie, MD 11/29/2017, 11:54 AM

## 2017-11-30 DIAGNOSIS — G894 Chronic pain syndrome: Secondary | ICD-10-CM | POA: Diagnosis not present

## 2017-11-30 DIAGNOSIS — M461 Sacroiliitis, not elsewhere classified: Secondary | ICD-10-CM | POA: Diagnosis not present

## 2017-11-30 DIAGNOSIS — Z79891 Long term (current) use of opiate analgesic: Secondary | ICD-10-CM | POA: Diagnosis not present

## 2017-11-30 DIAGNOSIS — F411 Generalized anxiety disorder: Secondary | ICD-10-CM | POA: Diagnosis not present

## 2017-11-30 DIAGNOSIS — M545 Low back pain: Secondary | ICD-10-CM | POA: Diagnosis not present

## 2017-11-30 DIAGNOSIS — F419 Anxiety disorder, unspecified: Secondary | ICD-10-CM | POA: Diagnosis not present

## 2017-12-01 ENCOUNTER — Ambulatory Visit (HOSPITAL_COMMUNITY): Payer: Medicare Other | Admitting: Psychiatry

## 2017-12-01 NOTE — Progress Notes (Signed)
BH MD/PA/NP OP Progress Note  12/02/2017 12:22 PM Paula Bailey  MRN:  [id number]  Chief Complaint:  Chief Complaint    Depression; Follow-up     HPI:  Patient presents for follow-up appointment for depression.  She states that she has been less depressed and anxious.  She wonders if she can feel back to herself.  She still feels tired at times and has difficulty in getting dressed and taking a bath.  She has been trying to take a walk.  She states that she tore up the disability evaluation in the past as it said she has mental retardation. She does not think she has mental retardation, stating that she can drive by herself. She wonders if she will be back to be like a child as she gets old. She was in special class, and graduated from high school. She has not worked for many years.   She denies insomnia. She has fair concentration. She denies SI. She denies panic attacks. She has good appetite. She denies SI. She feels less anxious and tense. She denies irritability or panic attacks.    Her family presents to the interview.  Paula Bailey has still not been able to maintain hygiene for herself, although she appears to be doing better.    Visit Diagnosis:    ICD-10-CM   1. Current moderate episode of major depressive disorder without prior episode (HCC) F32.1     Past Psychiatric History:  I have reviewed the patient's psychiatry history in detail and updated the patient record. Outpatient:denies Psychiatry admission:denies Previous suicide attempt:  Past trials of medication:Paxil, duloxetine (drowsiness), Wellbutrin, Xanax History of violence:   Past Medical History:  Past Medical History:  Diagnosis Date  . Dyslipidemia    No past surgical history on file.  Family Psychiatric History: I have reviewed the patient's family history in detail and updated the patient record.  Family History: No family history on file.  Social History:  Social History   Socioeconomic History   . Marital status: Divorced    Spouse name: Not on file  . Number of children: Not on file  . Years of education: Not on file  . Highest education level: Not on file  Occupational History  . Not on file  Social Needs  . Financial resource strain: Not on file  . Food insecurity:    Worry: Not on file    Inability: Not on file  . Transportation needs:    Medical: Not on file    Non-medical: Not on file  Tobacco Use  . Smoking status: Current Every Day Smoker  . Smokeless tobacco: Never Used  Substance and Sexual Activity  . Alcohol use: No    Frequency: Never  . Drug use: Not on file  . Sexual activity: Not on file  Lifestyle  . Physical activity:    Days per week: Not on file    Minutes per session: Not on file  . Stress: Not on file  Relationships  . Social connections:    Talks on phone: Not on file    Gets together: Not on file    Attends religious service: Not on file    Active member of club or organization: Not on file    Attends meetings of clubs or organizations: Not on file    Relationship status: Not on file  Other Topics Concern  . Not on file  Social History Narrative  . Not on file    Allergies: No Known Allergies  Metabolic Disorder Labs: No results found for: HGBA1C, MPG No results found for: PROLACTIN No results found for: CHOL, TRIG, HDL, CHOLHDL, VLDL, LDLCALC No results found for: TSH  Therapeutic Level Labs: No results found for: LITHIUM No results found for: VALPROATE No components found for:  CBMZ  Current Medications: Current Outpatient Medications  Medication Sig Dispense Refill  . alendronate (FOSAMAX) 70 MG tablet Take 70 mg by mouth once a week.  6  . ALPRAZolam (XANAX) 0.25 MG tablet Take 0.25 mg by mouth every 12 (twelve) hours.  0  . fluticasone (FLONASE) 50 MCG/ACT nasal spray Place 2 sprays into both nostrils daily.  4  . gabapentin (NEURONTIN) 300 MG capsule TAKE ONE CAPSULE THREE TIMES DAILY  2  . oxyCODONE-acetaminophen  (PERCOCET) 7.5-325 MG tablet Take 1 tablet by mouth every 8 (eight) hours as needed.  0  . sertraline (ZOLOFT) 100 MG tablet Take 1 tablet (100 mg total) by mouth daily. 30 tablet 0  . VENTOLIN HFA 108 (90 Base) MCG/ACT inhaler INHALE 1-2 PUFFS FOUR TIMES DAILY AS NEEDED  12   No current facility-administered medications for this visit.      Musculoskeletal: Strength & Muscle Tone: within normal limits Gait & Station: normal Patient leans: N/A  Psychiatric Specialty Exam: Review of Systems  Psychiatric/Behavioral: Positive for depression. Negative for hallucinations, memory loss, substance abuse and suicidal ideas. The patient is nervous/anxious. The patient does not have insomnia.   All other systems reviewed and are negative.   Blood pressure 100/64, pulse 84, height 5\' 3"  (1.6 m), weight 138 lb (62.6 kg), SpO2 96 %.Body mass index is 24.45 kg/m.  General Appearance: Fairly Groomed  Eye Contact:  Good  Speech:  Clear and Coherent  Volume:  Normal  Mood:  "better  Affect:  Appropriate, Congruent and less down  Thought Process:  Coherent and Goal Directed  Orientation:  Full (Time, Place, and Person)  Thought Content: Logical   Suicidal Thoughts:  No  Homicidal Thoughts:  No  Memory:  Immediate;   Good  Judgement:  Good  Insight:  Present  Psychomotor Activity:  Normal  Concentration:  Concentration: Good and Attention Span: Good  Recall:  Good  Fund of Knowledge: Good  Language: Good  Akathisia:  No  Handed:  Right  AIMS (if indicated): not done  Assets:  Communication Skills Desire for Improvement  ADL's:  Intact  Cognition: WNL  Sleep:  Good   Screenings:   Assessment and Plan:  Paula Bailey is a 57 y.o. year old female with a history of depression,   r/o intellectual disability, dyslipidemia, who presents for follow up appointment for Current moderate episode of major depressive disorder without prior episode (HCC)  # MDD, moderate,single episode without  psychotic features There has been overall improvement in neurovegetative symptoms since up titration of sertraline.  Although she may benefit from father up titration, she prefers to stay on the current dose.  We will continue sertraline to target depression.  Discussed behavioral activation.   # r/o intellectual disability Patient has been in special classes since child. ADL independent and she drives for short distance. Her family to bring record of disability evaluation.    Plan I have reviewed and updated plans as below 1. Continue sertraline 100 mg at night  2  Return to clinic in one month for 15 mins 3. Bring disability evaluation at the next appointment  (She has been on Xanax 0.25 mg BID as needed for anxiety) - TSH  pending  The patient demonstrates the following risk factors for suicide: Chronic risk factors for suicide include:psychiatric disorder ofdepression. Acute risk factorsfor suicide include: unemployment. Protective factorsfor this patient include: positive social support, coping skills and hope for the future. Considering these factors, the overall suicide risk at this point appears to below. Patientisappropriate for outpatient follow up.    [name], MD 12/02/2017, 12:22 PM

## 2017-12-02 ENCOUNTER — Encounter (HOSPITAL_COMMUNITY): Payer: Self-pay | Admitting: Psychiatry

## 2017-12-02 ENCOUNTER — Ambulatory Visit (INDEPENDENT_AMBULATORY_CARE_PROVIDER_SITE_OTHER): Payer: Medicare Other | Admitting: Psychiatry

## 2017-12-02 VITALS — BP 100/64 | HR 84 | Ht 63.0 in | Wt 138.0 lb

## 2017-12-02 DIAGNOSIS — F17210 Nicotine dependence, cigarettes, uncomplicated: Secondary | ICD-10-CM

## 2017-12-02 DIAGNOSIS — F419 Anxiety disorder, unspecified: Secondary | ICD-10-CM

## 2017-12-02 DIAGNOSIS — R450 Nervousness: Secondary | ICD-10-CM

## 2017-12-02 DIAGNOSIS — F321 Major depressive disorder, single episode, moderate: Secondary | ICD-10-CM

## 2017-12-02 NOTE — Patient Instructions (Signed)
1. Continue sertraline 100 mg at night  2  Return to clinic in one month for 15 mins 3. Bring disability evaluation at the next appointment

## 2017-12-19 ENCOUNTER — Other Ambulatory Visit (HOSPITAL_COMMUNITY): Payer: Self-pay | Admitting: Psychiatry

## 2017-12-19 MED ORDER — SERTRALINE HCL 100 MG PO TABS: 100.0000 mg | ORAL_TABLET | Freq: Every day | ORAL | 0 refills | Status: DC

## 2017-12-21 ENCOUNTER — Ambulatory Visit (INDEPENDENT_AMBULATORY_CARE_PROVIDER_SITE_OTHER): Payer: Medicare Other | Admitting: Licensed Clinical Social Worker

## 2017-12-21 ENCOUNTER — Encounter (HOSPITAL_COMMUNITY): Payer: Self-pay | Admitting: Licensed Clinical Social Worker

## 2017-12-21 DIAGNOSIS — F321 Major depressive disorder, single episode, moderate: Secondary | ICD-10-CM

## 2017-12-21 NOTE — Progress Notes (Signed)
   THERAPIST PROGRESS NOTE  Session Time: 1:00 pm-1:50 pm  Participation Level: Active  Behavioral Response: CasualAlertDepressed  Type of Therapy: Family Therapy  Treatment Goals addressed: Coping  Interventions: CBT and Solution Focused  Summary: Barnie AldermanMelissa Plumb is a 57 y.o. female who presents oriented x5 (person, place, situation, time and object), alert, casually dressed, appropriately groomed, average height, average weight, depressed and cooperative to address mood. Patient has minimal history of medical history. Patient has a history of mental health treatment including outpatient therapy and medication management. Patient denies symptoms of mania. She denies suicidal and homicidal ideations. Patient denies psychosis including auditory and visual hallucinations. Patient denies substance abuse. Patient denies a history of elopement. Patient is at low risk for lethality at this time.   Physically: Patient reports low energy and low strength. She has possibly kidney stones. Patient notes that she is waking up in the middle of the night for water.  Spiritually/values: No issues identified.  Relationship: Patient reported that her relationships are going well.  Emotional/Mental/Behavior: Patient feels overwhelmed because she is not where she used to be. Her sister sat in on the session as support for her. She notice that the patient is getting angry more often and becoming tearful due to frustration with herself. After discussion, patient identified that talking a walk would be the first step she could do to make a change. It is something she enjoyed in the past. Patient also noted that if she walks then she will need a shower which is something that she wants to work on. Patient agreed to work on these steps and be patient with herself.   Patient engaged in session She responded well to interventions. Patient continues to meet criteria for Current moderate episode or major depressive disorder  without prior episode. Patient will continue in outpatient therapy due to being the least restrictive service to meet her needs. Patient made no progress on her goals at this time.   Suicidal/Homicidal: Negativewithout intent/plan  Therapist Response: Therapist reviewed patient's recent thoughts and behaviors. Therapist utilized CBT to address mood. Therapist processed patient's feelings to identify triggers for mood. Therapist assisted patient to identify small steps to take to get back where she used to be including walking three times a week, and showering after her walk.   Plan: Return again in 4 weeks.  Diagnosis: Axis I: Current moderate episode of major depressive disorder without prior episode    Axis II: No diagnosis    Bynum BellowsJoshua Keeghan Bialy, LCSW 12/21/2017

## 2018-01-05 NOTE — Progress Notes (Signed)
BH MD/PA/NP OP Progress Note  01/09/2018 3:35 PM Kayleen Alig  MRN:  [id number]  Chief Complaint:  Chief Complaint    Depression; Follow-up     HPI:  Patient presents for follow-up appointment for depression, accompanied by her sister.  She states that she has been feeling down and has crying spells today.  She feels struggled that she feels exhausted and cannot take a bath. She agrees to try little step as possible; first trying to wash her legs. She has been trying to take a walk. She has fair sleep. She has good appetite. She has fair concentration. She denies SI. She feels anxious, tense at times. She denies panic attacks.   Visit Diagnosis:    ICD-10-CM   1. Current moderate episode of major depressive disorder without prior episode (HCC) F32.1     Past Psychiatric History:  I have reviewed the patient's psychiatry history in detail and updated the patient record. Outpatient:denies Psychiatry admission:denies Previous suicide attempt:  Past trials of medication:Paxil, duloxetine (drowsiness), Wellbutrin, Xanax History of violence:   Past Medical History:  Past Medical History:  Diagnosis Date  . Dyslipidemia    No past surgical history on file.  Family Psychiatric History: I have reviewed the patient's family history in detail and updated the patient record.  Family History: No family history on file.  Social History:  Social History   Socioeconomic History  . Marital status: Divorced    Spouse name: Not on file  . Number of children: Not on file  . Years of education: Not on file  . Highest education level: Not on file  Occupational History  . Not on file  Social Needs  . Financial resource strain: Not on file  . Food insecurity:    Worry: Not on file    Inability: Not on file  . Transportation needs:    Medical: Not on file    Non-medical: Not on file  Tobacco Use  . Smoking status: Current Every Day Smoker  . Smokeless tobacco: Never Used   Substance and Sexual Activity  . Alcohol use: No    Frequency: Never  . Drug use: Not on file  . Sexual activity: Not on file  Lifestyle  . Physical activity:    Days per week: Not on file    Minutes per session: Not on file  . Stress: Not on file  Relationships  . Social connections:    Talks on phone: Not on file    Gets together: Not on file    Attends religious service: Not on file    Active member of club or organization: Not on file    Attends meetings of clubs or organizations: Not on file    Relationship status: Not on file  Other Topics Concern  . Not on file  Social History Narrative  . Not on file    Allergies: No Known Allergies  Metabolic Disorder Labs: No results found for: HGBA1C, MPG No results found for: PROLACTIN No results found for: CHOL, TRIG, HDL, CHOLHDL, VLDL, LDLCALC No results found for: TSH  Therapeutic Level Labs: No results found for: LITHIUM No results found for: VALPROATE No components found for:  CBMZ  Current Medications: Current Outpatient Medications  Medication Sig Dispense Refill  . alendronate (FOSAMAX) 70 MG tablet Take 70 mg by mouth once a week.  6  . ALPRAZolam (XANAX) 0.25 MG tablet Take 0.25 mg by mouth every 12 (twelve) hours.  0  . fluticasone (FLONASE) 50 MCG/ACT  nasal spray Place 2 sprays into both nostrils daily.  4  . gabapentin (NEURONTIN) 300 MG capsule TAKE ONE CAPSULE THREE TIMES DAILY  2  . oxyCODONE-acetaminophen (PERCOCET) 7.5-325 MG tablet Take 1 tablet by mouth every 8 (eight) hours as needed.  0  . sertraline (ZOLOFT) 100 MG tablet Take 1.5 tablets (150 mg total) by mouth daily. 45 tablet 0  . VENTOLIN HFA 108 (90 Base) MCG/ACT inhaler INHALE 1-2 PUFFS FOUR TIMES DAILY AS NEEDED  12   No current facility-administered medications for this visit.      Musculoskeletal: Strength & Muscle Tone: within normal limits Gait & Station: normal Patient leans: N/A  Psychiatric Specialty Exam: Review of Systems   Psychiatric/Behavioral: Positive for depression. Negative for hallucinations, memory loss, substance abuse and suicidal ideas. The patient is nervous/anxious. The patient does not have insomnia.   All other systems reviewed and are negative.   Blood pressure 111/72, pulse 74, height  (1.6 m), weight 142 lb (64.4 kg), SpO2 97 %.Body mass index is 25.15 kg/m.  General Appearance: Fairly Groomed  Eye Contact:  Good  Speech:  Clear and Coherent  Volume:  Normal  Mood:  Depressed  Affect:  Appropriate, Congruent, Tearful and down  Thought Process:  Coherent  Orientation:  Full (Time, Place, and Person)  Thought Content: Logical   Suicidal Thoughts:  No  Homicidal Thoughts:  No  Memory:  Immediate;   Good  Judgement:  Good  Insight:  Fair  Psychomotor Activity:  Normal  Concentration:  Concentration: Good and Attention Span: Good  Recall:  Good  Fund of Knowledge: Good  Language: Good  Akathisia:  No  Handed:  Right  AIMS (if indicated): not done  Assets:  Communication Skills Desire for Improvement  ADL's:  Intact  Cognition: WNL  Sleep:  Fair   Screenings:   Assessment and Plan:  Kelicia Youtz is a 57 y.o. year old female with a history of depression, r/o intellectual disability,  dyslipidemia , who presents for follow up appointment for Current moderate episode of major depressive disorder without prior episode (HCC)  # MDD, moderate, single episode without psychotic features Patient endorses fatigue since the last appointment.  Will uptitrate sertraline to target residual neurovegetative symptoms.  She is advised to contact the clinic if any worsening in fatigue secondary to medication.  Discussed behavioral activation.   # r/o intellectual disability Patient has been in special class since child. ADL independent. Her sister will bring record of disability evaluation.   Plan 1. Increase sertraline 150 mg at night  2. Return to clinic in one month for 15 mins 3.  Bring disability evaluation at the next appointment  (She has been onXanax 0.25 mgBIDas needed for anxiety) - TSH pending  The patient demonstrates the following risk factors for suicide: Chronic risk factors for suicide include:psychiatric disorder ofdepression. Acute risk factorsfor suicide include: unemployment. Protective factorsfor this patient include: positive social support, coping skills and hope for the future. Considering these factors, the overall suicide risk at this point appears to below. Patientisappropriate for outpatient follow up.    [name], MD 01/09/2018, 3:35 PM

## 2018-01-09 ENCOUNTER — Ambulatory Visit (INDEPENDENT_AMBULATORY_CARE_PROVIDER_SITE_OTHER): Payer: Medicare Other | Admitting: Psychiatry

## 2018-01-09 ENCOUNTER — Encounter (HOSPITAL_COMMUNITY): Payer: Self-pay | Admitting: Psychiatry

## 2018-01-09 VITALS — BP 111/72 | HR 74 | Ht 63.0 in | Wt 142.0 lb

## 2018-01-09 DIAGNOSIS — Z560 Unemployment, unspecified: Secondary | ICD-10-CM | POA: Diagnosis not present

## 2018-01-09 DIAGNOSIS — F321 Major depressive disorder, single episode, moderate: Secondary | ICD-10-CM

## 2018-01-09 DIAGNOSIS — R5383 Other fatigue: Secondary | ICD-10-CM | POA: Diagnosis not present

## 2018-01-09 DIAGNOSIS — F419 Anxiety disorder, unspecified: Secondary | ICD-10-CM

## 2018-01-09 DIAGNOSIS — F17200 Nicotine dependence, unspecified, uncomplicated: Secondary | ICD-10-CM

## 2018-01-09 DIAGNOSIS — R450 Nervousness: Secondary | ICD-10-CM | POA: Diagnosis not present

## 2018-01-09 MED ORDER — SERTRALINE HCL 100 MG PO TABS: 150.0000 mg | ORAL_TABLET | Freq: Every day | ORAL | 0 refills | Status: DC

## 2018-01-09 NOTE — Patient Instructions (Signed)
1. Increase sertraline 150 mg at night  2. Return to clinic in one month for 15 mins

## 2018-01-20 DIAGNOSIS — R1031 Right lower quadrant pain: Secondary | ICD-10-CM | POA: Diagnosis not present

## 2018-01-20 DIAGNOSIS — R109 Unspecified abdominal pain: Secondary | ICD-10-CM | POA: Diagnosis not present

## 2018-01-20 DIAGNOSIS — Z6824 Body mass index (BMI) 24.0-24.9, adult: Secondary | ICD-10-CM | POA: Diagnosis not present

## 2018-01-20 DIAGNOSIS — F17210 Nicotine dependence, cigarettes, uncomplicated: Secondary | ICD-10-CM | POA: Diagnosis not present

## 2018-01-20 DIAGNOSIS — Z299 Encounter for prophylactic measures, unspecified: Secondary | ICD-10-CM | POA: Diagnosis not present

## 2018-01-20 DIAGNOSIS — N200 Calculus of kidney: Secondary | ICD-10-CM | POA: Diagnosis not present

## 2018-01-24 DIAGNOSIS — J069 Acute upper respiratory infection, unspecified: Secondary | ICD-10-CM | POA: Diagnosis not present

## 2018-01-24 DIAGNOSIS — Z6824 Body mass index (BMI) 24.0-24.9, adult: Secondary | ICD-10-CM | POA: Diagnosis not present

## 2018-01-24 DIAGNOSIS — Z713 Dietary counseling and surveillance: Secondary | ICD-10-CM | POA: Diagnosis not present

## 2018-01-24 DIAGNOSIS — Z299 Encounter for prophylactic measures, unspecified: Secondary | ICD-10-CM | POA: Diagnosis not present

## 2018-01-24 DIAGNOSIS — F17210 Nicotine dependence, cigarettes, uncomplicated: Secondary | ICD-10-CM | POA: Diagnosis not present

## 2018-01-25 DIAGNOSIS — F419 Anxiety disorder, unspecified: Secondary | ICD-10-CM | POA: Diagnosis not present

## 2018-01-25 DIAGNOSIS — M461 Sacroiliitis, not elsewhere classified: Secondary | ICD-10-CM | POA: Diagnosis not present

## 2018-01-25 DIAGNOSIS — Z79891 Long term (current) use of opiate analgesic: Secondary | ICD-10-CM | POA: Diagnosis not present

## 2018-01-25 DIAGNOSIS — M545 Low back pain: Secondary | ICD-10-CM | POA: Diagnosis not present

## 2018-01-27 DIAGNOSIS — E7800 Pure hypercholesterolemia, unspecified: Secondary | ICD-10-CM | POA: Diagnosis not present

## 2018-01-27 DIAGNOSIS — F329 Major depressive disorder, single episode, unspecified: Secondary | ICD-10-CM | POA: Diagnosis not present

## 2018-02-02 NOTE — Progress Notes (Signed)
BH MD/PA/NP OP Progress Note  02/07/2018 4:50 PM Paula AldermanMelissa Bailey  MRN:  [id number]  Chief Complaint:  Chief Complaint    Depression; Follow-up     HPI:  Patient presents for follow-up appointment for depression.  Patient state that she wonders if it is better to lower sertraline dose. She wonders if "medication is going a bit faster," then states that "I'll continue the same as it may not be in the system." Although she initially reports mild fatigue, she states that it has been getting better. She bathed twice and has started to take a walk. She went to family gathering the other day and she had a good time. She feels anxious at times. She takes xanax occasionally for anxiety; once a week or less. She almost had a panic attack on the way here. She denies insomnia. She has fair concentration. She denies SI.   Wt Readings from Last 3 Encounters:  02/07/18 139 lb (63 kg)  01/09/18 142 lb (64.4 kg)  12/02/17 138 lb (62.6 kg)    Visit Diagnosis:    ICD-10-CM   1. Current moderate episode of major depressive disorder without prior episode (HCC) F32.1     Past Psychiatric History: Please see initial evaluation for full details. I have reviewed the history. No updates at this time.     Past Medical History:  Past Medical History:  Diagnosis Date  . Dyslipidemia    No past surgical history on file.  Family Psychiatric History: Please see initial evaluation for full details. I have reviewed the history. No updates at this time.     Family History: No family history on file.  Social History:  Social History   Socioeconomic History  . Marital status: Divorced    Spouse name: Not on file  . Number of children: Not on file  . Years of education: Not on file  . Highest education level: Not on file  Occupational History  . Not on file  Social Needs  . Financial resource strain: Not on file  . Food insecurity:    Worry: Not on file    Inability: Not on file  . Transportation  needs:    Medical: Not on file    Non-medical: Not on file  Tobacco Use  . Smoking status: Current Every Day Smoker  . Smokeless tobacco: Never Used  Substance and Sexual Activity  . Alcohol use: No    Frequency: Never  . Drug use: Not on file  . Sexual activity: Not on file  Lifestyle  . Physical activity:    Days per week: Not on file    Minutes per session: Not on file  . Stress: Not on file  Relationships  . Social connections:    Talks on phone: Not on file    Gets together: Not on file    Attends religious service: Not on file    Active member of club or organization: Not on file    Attends meetings of clubs or organizations: Not on file    Relationship status: Not on file  Other Topics Concern  . Not on file  Social History Narrative  . Not on file    Allergies: No Known Allergies  Metabolic Disorder Labs: No results found for: HGBA1C, MPG No results found for: PROLACTIN No results found for: CHOL, TRIG, HDL, CHOLHDL, VLDL, LDLCALC No results found for: TSH  Therapeutic Level Labs: No results found for: LITHIUM No results found for: VALPROATE No components found for:  CBMZ  Current Medications: Current Outpatient Medications  Medication Sig Dispense Refill  . alendronate (FOSAMAX) 70 MG tablet Take 70 mg by mouth once a week.  6  . ALPRAZolam (XANAX) 0.25 MG tablet Take 0.25 mg by mouth every 12 (twelve) hours.  0  . fluticasone (FLONASE) 50 MCG/ACT nasal spray Place 2 sprays into both nostrils daily.  4  . gabapentin (NEURONTIN) 300 MG capsule TAKE ONE CAPSULE THREE TIMES DAILY  2  . oxyCODONE-acetaminophen (PERCOCET) 7.5-325 MG tablet Take 1 tablet by mouth every 8 (eight) hours as needed.  0  . sertraline (ZOLOFT) 100 MG tablet Take 1.5 tablets (150 mg total) by mouth daily. 135 tablet 0  . VENTOLIN HFA 108 (90 Base) MCG/ACT inhaler INHALE 1-2 PUFFS FOUR TIMES DAILY AS NEEDED  12   No current facility-administered medications for this visit.       Musculoskeletal: Strength & Muscle Tone: within normal limits Gait & Station: normal Patient leans: N/A  Psychiatric Specialty Exam: Review of Systems  Psychiatric/Behavioral: Positive for depression. Negative for hallucinations, memory loss, substance abuse and suicidal ideas. The patient is nervous/anxious. The patient does not have insomnia.   All other systems reviewed and are negative.   Blood pressure 112/68, pulse 80, height 5\' 3"  (1.6 m), weight 139 lb (63 kg), SpO2 97 %.Body mass index is 24.62 kg/m.  General Appearance: Fairly Groomed  Eye Contact:  Good  Speech:  Clear and Coherent  Volume:  Normal  Mood:  "better"  Affect:  Appropriate, Congruent and more ractive  Thought Process:  Coherent  Orientation:  Full (Time, Place, and Person)  Thought Content: Logical   Suicidal Thoughts:  No  Homicidal Thoughts:  No  Memory:  Immediate;   Good  Judgement:  Fair  Insight:  Present  Psychomotor Activity:  Normal  Concentration:  Concentration: Good and Attention Span: Good  Recall:  Good  Fund of Knowledge: Good  Language: Good  Akathisia:  No  Handed:  Right  AIMS (if indicated): not done  Assets:  Communication Skills Desire for Improvement  ADL's:  Intact  Cognition: WNL  Sleep:  Good    Screenings:   Assessment and Plan:  Paula Bailey is a 57 y.o. year old female with a history of depression, r/o intellectual disability, dyslipidemia , who presents for follow up appointment for Current moderate episode of major depressive disorder without prior episode (HCC)  # MDD, moderate, single episode without psychotic features Patient reports overall improvement in neurovegetative symptoms after up titration of sertraline.  Will continue current dose to target depression.  Discussed behavioral activation.   # r/o intellectual disability Patient was in special classas a child. ADL independent. Her sister will bring the record of disability evaluation.    Plan 1. Continue sertraline 150 mg at night  2. Return to clinic in two months for 15 mins  3. Bring disability evaluation at the next appointment  4. Ask your primary care doctor about thyroid test (She has been onXanax 0.25 mgBIDas needed for anxiety)  The patient demonstrates the following risk factors for suicide: Chronic risk factors for suicide include:psychiatric disorder ofdepression. Acute risk factorsfor suicide include: unemployment. Protective factorsfor this patient include: positive social support, coping skills and hope for the future. Considering these factors, the overall suicide risk at this point appears to below. Patientisappropriate for outpatient follow up.  [name], MD 02/07/2018, 4:50 PM

## 2018-02-03 ENCOUNTER — Ambulatory Visit (INDEPENDENT_AMBULATORY_CARE_PROVIDER_SITE_OTHER): Payer: Medicare Other | Admitting: Licensed Clinical Social Worker

## 2018-02-03 ENCOUNTER — Encounter (HOSPITAL_COMMUNITY): Payer: Self-pay | Admitting: Licensed Clinical Social Worker

## 2018-02-03 DIAGNOSIS — F321 Major depressive disorder, single episode, moderate: Secondary | ICD-10-CM | POA: Diagnosis not present
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
## 2018-02-07 ENCOUNTER — Encounter (HOSPITAL_COMMUNITY): Payer: Self-pay | Admitting: Psychiatry

## 2018-02-07 ENCOUNTER — Ambulatory Visit (INDEPENDENT_AMBULATORY_CARE_PROVIDER_SITE_OTHER): Payer: Medicare Other | Admitting: Psychiatry

## 2018-02-07 VITALS — BP 112/68 | HR 80 | Ht 63.0 in | Wt 139.0 lb

## 2018-02-07 DIAGNOSIS — Z79899 Other long term (current) drug therapy: Secondary | ICD-10-CM | POA: Diagnosis not present

## 2018-02-07 DIAGNOSIS — F321 Major depressive disorder, single episode, moderate: Secondary | ICD-10-CM | POA: Diagnosis not present

## 2018-02-07 DIAGNOSIS — F17210 Nicotine dependence, cigarettes, uncomplicated: Secondary | ICD-10-CM

## 2018-02-07 DIAGNOSIS — R450 Nervousness: Secondary | ICD-10-CM

## 2018-02-07 MED ORDER — SERTRALINE HCL 100 MG PO TABS: 150.0000 mg | ORAL_TABLET | Freq: Every day | ORAL | 0 refills | Status: DC

## 2018-02-07 NOTE — Patient Instructions (Signed)
1. Continue sertraline 150 mg at night  2. Return to clinic in two months for 15 mins  3. Bring disability evaluation at the next appointment  4. Ask your primary care doctor about thyroid test

## 2018-02-23 DIAGNOSIS — E7800 Pure hypercholesterolemia, unspecified: Secondary | ICD-10-CM | POA: Diagnosis not present

## 2018-02-23 DIAGNOSIS — F329 Major depressive disorder, single episode, unspecified: Secondary | ICD-10-CM | POA: Diagnosis not present

## 2018-03-14 ENCOUNTER — Ambulatory Visit (HOSPITAL_COMMUNITY): Payer: Medicare Other | Admitting: Licensed Clinical Social Worker

## 2018-03-15 DIAGNOSIS — Z6824 Body mass index (BMI) 24.0-24.9, adult: Secondary | ICD-10-CM | POA: Diagnosis not present

## 2018-03-15 DIAGNOSIS — Z299 Encounter for prophylactic measures, unspecified: Secondary | ICD-10-CM | POA: Diagnosis not present

## 2018-03-15 DIAGNOSIS — J069 Acute upper respiratory infection, unspecified: Secondary | ICD-10-CM | POA: Diagnosis not present

## 2018-03-15 DIAGNOSIS — Z713 Dietary counseling and surveillance: Secondary | ICD-10-CM | POA: Diagnosis not present

## 2018-03-15 DIAGNOSIS — F17210 Nicotine dependence, cigarettes, uncomplicated: Secondary | ICD-10-CM | POA: Diagnosis not present

## 2018-03-22 DIAGNOSIS — M545 Low back pain: Secondary | ICD-10-CM | POA: Diagnosis not present

## 2018-03-22 DIAGNOSIS — Z79891 Long term (current) use of opiate analgesic: Secondary | ICD-10-CM | POA: Diagnosis not present

## 2018-03-22 DIAGNOSIS — M461 Sacroiliitis, not elsewhere classified: Secondary | ICD-10-CM | POA: Diagnosis not present

## 2018-03-22 DIAGNOSIS — F419 Anxiety disorder, unspecified: Secondary | ICD-10-CM | POA: Diagnosis not present

## 2018-04-03 NOTE — Progress Notes (Deleted)
BH MD/PA/NP OP Progress Note  04/03/2018 1:45 PM Paula Bailey  MRN:  [id number]  Chief Complaint:  HPI: *** Visit Diagnosis: No diagnosis found.  Past Psychiatric History: Please see initial evaluation for full details. I have reviewed the history. No updates at this time.     Past Medical History:  Past Medical History:  Diagnosis Date  . Dyslipidemia    No past surgical history on file.  Family Psychiatric History: Please see initial evaluation for full details. I have reviewed the history. No updates at this time.     Family History: No family history on file.  Social History:  Social History   Socioeconomic History  . Marital status: Divorced    Spouse name: Not on file  . Number of children: Not on file  . Years of education: Not on file  . Highest education level: Not on file  Occupational History  . Not on file  Social Needs  . Financial resource strain: Not on file  . Food insecurity:    Worry: Not on file    Inability: Not on file  . Transportation needs:    Medical: Not on file    Non-medical: Not on file  Tobacco Use  . Smoking status: Current Every Day Smoker  . Smokeless tobacco: Never Used  Substance and Sexual Activity  . Alcohol use: No    Frequency: Never  . Drug use: Not on file  . Sexual activity: Not on file  Lifestyle  . Physical activity:    Days per week: Not on file    Minutes per session: Not on file  . Stress: Not on file  Relationships  . Social connections:    Talks on phone: Not on file    Gets together: Not on file    Attends religious service: Not on file    Active member of club or organization: Not on file    Attends meetings of clubs or organizations: Not on file    Relationship status: Not on file  Other Topics Concern  . Not on file  Social History Narrative  . Not on file    Allergies: No Known Allergies  Metabolic Disorder Labs: No results found for: HGBA1C, MPG No results found for: PROLACTIN No results  found for: CHOL, TRIG, HDL, CHOLHDL, VLDL, LDLCALC No results found for: TSH  Therapeutic Level Labs: No results found for: LITHIUM No results found for: VALPROATE No components found for:  CBMZ  Current Medications: Current Outpatient Medications  Medication Sig Dispense Refill  . alendronate (FOSAMAX) 70 MG tablet Take 70 mg by mouth once a week.  6  . ALPRAZolam (XANAX) 0.25 MG tablet Take 0.25 mg by mouth every 12 (twelve) hours.  0  . fluticasone (FLONASE) 50 MCG/ACT nasal spray Place 2 sprays into both nostrils daily.  4  . gabapentin (NEURONTIN) 300 MG capsule TAKE ONE CAPSULE THREE TIMES DAILY  2  . oxyCODONE-acetaminophen (PERCOCET) 7.5-325 MG tablet Take 1 tablet by mouth every 8 (eight) hours as needed.  0  . sertraline (ZOLOFT) 100 MG tablet Take 1.5 tablets (150 mg total) by mouth daily. 135 tablet 0  . VENTOLIN HFA 108 (90 Base) MCG/ACT inhaler INHALE 1-2 PUFFS FOUR TIMES DAILY AS NEEDED  12   No current facility-administered medications for this visit.      Musculoskeletal: Strength & Muscle Tone: within normal limits Gait & Station: normal Patient leans: N/A  Psychiatric Specialty Exam: ROS  There were no vitals taken for  this visit.There is no height or weight on file to calculate BMI.  General Appearance: Fairly Groomed  Eye Contact:  Good  Speech:  Clear and Coherent  Volume:  Normal  Mood:  {BHH MOOD:22306}  Affect:  {Affect (PAA):22687}  Thought Process:  Coherent  Orientation:  Full (Time, Place, and Person)  Thought Content: Logical   Suicidal Thoughts:  {ST/HT (PAA):22692}  Homicidal Thoughts:  {ST/HT (PAA):22692}  Memory:  Immediate;   Good  Judgement:  {Judgement (PAA):22694}  Insight:  {Insight (PAA):22695}  Psychomotor Activity:  Normal  Concentration:  Concentration: Good and Attention Span: Good  Recall:  Good  Fund of Knowledge: Good  Language: Good  Akathisia:  No  Handed:  Right  AIMS (if indicated): not done  Assets:   Communication Skills Desire for Improvement  ADL's:  Intact  Cognition: WNL  Sleep:  {BHH GOOD/FAIR/POOR:22877}   Screenings:   Assessment and Plan:  Paula Bailey is a 57 y.o. year old female with a history of depression,  r/o intellectual disability, dyslipidemia , who presents for follow up appointment for No diagnosis found.  # MDD, moderate, single episode without psychotic features  Patient reports overall improvement in neurovegetative symptoms after up titration of sertraline.  Will continue current dose to target depression.  Discussed behavioral activation.   # r/o intellectual disability Patient was in special classas a child. ADL independent. Her sister will bring the record of disability evaluation.   Plan 1. Continue sertraline 150 mg at night  2. Return to clinic in two months for 15 mins  3. Bring disability evaluation at the next appointment  4. Ask your primary care doctor about thyroid test (She has been onXanax 0.25 mgBIDas needed for anxiety)  The patient demonstrates the following risk factors for suicide: Chronic risk factors for suicide include:psychiatric disorder ofdepression. Acute risk factorsfor suicide include: unemployment. Protective factorsfor this patient include: positive social support, coping skills and hope for the future. Considering these factors, the overall suicide risk at this point appears to below. Patientisappropriate for outpatient follow up.    [name], MD 04/03/2018, 1:45 PM

## 2018-04-17 ENCOUNTER — Ambulatory Visit (HOSPITAL_COMMUNITY): Payer: Medicare Other | Admitting: Psychiatry

## 2018-05-03 DIAGNOSIS — Z23 Encounter for immunization: Secondary | ICD-10-CM | POA: Diagnosis not present

## 2018-05-08 ENCOUNTER — Telehealth (HOSPITAL_COMMUNITY): Payer: Self-pay | Admitting: Psychiatry

## 2018-05-08 MED ORDER — SERTRALINE HCL 100 MG PO TABS: 150.0000 mg | ORAL_TABLET | Freq: Every day | ORAL | 0 refills | Status: DC

## 2018-05-08 NOTE — Telephone Encounter (Signed)
SPoke with patient & informed per provider : Ordered sertraline refill per request. Please contact the patient to make follow up appointment. I will not be able to do any more refill without evaluation.    Patient stated she had been sick with pneumonia & needed to make appointments with the provider & therapist transferred to [facility] to schedule

## 2018-05-08 NOTE — Telephone Encounter (Signed)
Ordered sertraline refill per request. Please contact the patient to make follow up appointment. I will not be able to do any more refill without evaluation. 

## 2018-05-09 NOTE — Progress Notes (Signed)
BH MD/PA/NP OP Progress Note  05/18/2018 9:35 AM Paula Bailey  MRN:  [id number]  Chief Complaint:  Chief Complaint    Follow-up; Depression     HPI:  Patient presents for follow-up appointment for depression.  She presents to the appointment by herself.  She states that she believes her medications are working well, stating that she does not feel depressed as she used to in the morning.  She has been having pneumonia, and apologized for no-shows.  She agrees to contact the clinic for cancellation.  She feels less anxious.  She had only one panic attack.  She has been more motivated and bathing every other day.  She reports improvement in the insomnia.  She denies anhedonia. She reports good appetite.  She denies SI.    Wt Readings from Last 3 Encounters:  05/18/18 142 lb (64.4 kg)  02/07/18 139 lb (63 kg)  01/09/18 142 lb (64.4 kg)    Visit Diagnosis:    ICD-10-CM   1. Current moderate episode of major depressive disorder without prior episode (HCC) F32.1     Past Psychiatric History: Please see initial evaluation for full details. I have reviewed the history. No updates at this time.     Past Medical History:  Past Medical History:  Diagnosis Date  . Dyslipidemia    No past surgical history on file.  Family Psychiatric History: Please see initial evaluation for full details. I have reviewed the history. No updates at this time.     Family History: No family history on file.  Social History:  Social History   Socioeconomic History  . Marital status: Divorced    Spouse name: Not on file  . Number of children: Not on file  . Years of education: Not on file  . Highest education level: Not on file  Occupational History  . Not on file  Social Needs  . Financial resource strain: Not on file  . Food insecurity:    Worry: Not on file    Inability: Not on file  . Transportation needs:    Medical: Not on file    Non-medical: Not on file  Tobacco Use  . Smoking  status: Current Every Day Smoker    Packs/day: 0.50    Years: 10.00    Pack years: 5.00  . Smokeless tobacco: Never Used  Substance and Sexual Activity  . Alcohol use: No    Frequency: Never  . Drug use: Not Currently  . Sexual activity: Not Currently  Lifestyle  . Physical activity:    Days per week: Not on file    Minutes per session: Not on file  . Stress: Not on file  Relationships  . Social connections:    Talks on phone: Not on file    Gets together: Not on file    Attends religious service: Not on file    Active member of club or organization: Not on file    Attends meetings of clubs or organizations: Not on file    Relationship status: Not on file  Other Topics Concern  . Not on file  Social History Narrative  . Not on file    Allergies: No Known Allergies  Metabolic Disorder Labs: No results found for: HGBA1C, MPG No results found for: PROLACTIN No results found for: CHOL, TRIG, HDL, CHOLHDL, VLDL, LDLCALC No results found for: TSH  Therapeutic Level Labs: No results found for: LITHIUM No results found for: VALPROATE No components found for:  CBMZ  Current  Medications: Current Outpatient Medications  Medication Sig Dispense Refill  . alendronate (FOSAMAX) 70 MG tablet Take 70 mg by mouth once a week.  6  . ALPRAZolam (XANAX) 0.25 MG tablet Take 0.25 mg by mouth every 12 (twelve) hours.  0  . fluticasone (FLONASE) 50 MCG/ACT nasal spray Place 2 sprays into both nostrils daily.  4  . gabapentin (NEURONTIN) 300 MG capsule TAKE ONE CAPSULE THREE TIMES DAILY  2  . oxyCODONE-acetaminophen (PERCOCET) 7.5-325 MG tablet Take 1 tablet by mouth every 8 (eight) hours as needed.  0  . sertraline (ZOLOFT) 100 MG tablet Take 1.5 tablets (150 mg total) by mouth daily. 135 tablet 0  . VENTOLIN HFA 108 (90 Base) MCG/ACT inhaler INHALE 1-2 PUFFS FOUR TIMES DAILY AS NEEDED  12   No current facility-administered medications for this visit.      Musculoskeletal: Strength  & Muscle Tone: within normal limits Gait & Station: normal Patient leans: N/A  Psychiatric Specialty Exam: Review of Systems  Psychiatric/Behavioral: Positive for depression. Negative for hallucinations, memory loss, substance abuse and suicidal ideas. The patient is nervous/anxious. The patient does not have insomnia.   All other systems reviewed and are negative.   Blood pressure 111/73, pulse 76, height 5' 2.25" (1.581 m), weight 142 lb (64.4 kg).Body mass index is 25.76 kg/m.  General Appearance: Fairly Groomed  Eye Contact:  Good  Speech:  Clear and Coherent  Volume:  Normal  Mood:  "good"  Affect:  Appropriate, Congruent and euthymic smiles, more relaxed  Thought Process:  Coherent  Orientation:  Full (Time, Place, and Person)  Thought Content: Logical   Suicidal Thoughts:  No  Homicidal Thoughts:  No  Memory:  Immediate;   Good  Judgement:  Fair  Insight:  Present  Psychomotor Activity:  Normal  Concentration:  Concentration: Good and Attention Span: Good  Recall:  Good  Fund of Knowledge: Good  Language: Good  Akathisia:  No  Handed:  Right  AIMS (if indicated): not done  Assets:  Communication Skills Desire for Improvement  ADL's:  Intact  Cognition: WNL  Sleep:  Good   Screenings:   Assessment and Plan:  Paula Bailey is a 57 y.o. year old female with a history of depression, r/o intellectual disability, dyslipidemia , who presents for follow up appointment for Current moderate episode of major depressive disorder without prior episode (HCC)  # MDD, moderate, single episode without psychotic features Patient reports improvement in neurovegetative symptoms and anxiety since the last appointment.  Will continue current dose to target depression .  Discussed behavioral activation .   #  r/o intellectual disability Patient was in special classes a child.  ADL independent.  Her sister will bring the record of disability evaluation.   Plan 1. Continue  sertraline 150 mg at night  2. Return to clinic in three months for 15 mins  3. Bring disability evaluation at the next appointment  4. Ask your primary care doctor about thyroid test (She has been onXanax 0.25 mgBIDas needed for anxiety)  The patient demonstrates the following risk factors for suicide: Chronic risk factors for suicide include:psychiatric disorder ofdepression. Acute risk factorsfor suicide include: unemployment. Protective factorsfor this patient include: positive social support, coping skills and hope for the future. Considering these factors, the overall suicide risk at this point appears to below. Patientisappropriate for outpatient follow up.  [name], MD 05/18/2018, 9:35 AM

## 2018-05-18 ENCOUNTER — Ambulatory Visit (INDEPENDENT_AMBULATORY_CARE_PROVIDER_SITE_OTHER): Payer: Medicare Other | Admitting: Psychiatry

## 2018-05-18 ENCOUNTER — Encounter (HOSPITAL_COMMUNITY): Payer: Self-pay | Admitting: Psychiatry

## 2018-05-18 VITALS — BP 111/73 | HR 76 | Ht 62.25 in | Wt 142.0 lb

## 2018-05-18 DIAGNOSIS — F419 Anxiety disorder, unspecified: Secondary | ICD-10-CM

## 2018-05-18 DIAGNOSIS — F321 Major depressive disorder, single episode, moderate: Secondary | ICD-10-CM | POA: Diagnosis not present

## 2018-05-18 DIAGNOSIS — F17210 Nicotine dependence, cigarettes, uncomplicated: Secondary | ICD-10-CM | POA: Diagnosis not present

## 2018-05-18 MED ORDER — SERTRALINE HCL 100 MG PO TABS: 150.0000 mg | ORAL_TABLET | Freq: Every day | ORAL | 0 refills | Status: DC

## 2018-05-18 NOTE — Patient Instructions (Addendum)
1. Continue sertraline 150 mg at night  2. Return to clinic in three months for 15 mins  3. Bring disability evaluation at the next appointment  4. Ask your primary care doctor about thyroid test

## 2018-05-31 DIAGNOSIS — Z79891 Long term (current) use of opiate analgesic: Secondary | ICD-10-CM | POA: Diagnosis not present

## 2018-05-31 DIAGNOSIS — M545 Low back pain: Secondary | ICD-10-CM | POA: Diagnosis not present

## 2018-05-31 DIAGNOSIS — M461 Sacroiliitis, not elsewhere classified: Secondary | ICD-10-CM | POA: Diagnosis not present

## 2018-05-31 DIAGNOSIS — F419 Anxiety disorder, unspecified: Secondary | ICD-10-CM | POA: Diagnosis not present

## 2018-05-31 DIAGNOSIS — G894 Chronic pain syndrome: Secondary | ICD-10-CM | POA: Diagnosis not present

## 2018-06-09 DIAGNOSIS — E7800 Pure hypercholesterolemia, unspecified: Secondary | ICD-10-CM | POA: Diagnosis not present

## 2018-06-09 DIAGNOSIS — Z6825 Body mass index (BMI) 25.0-25.9, adult: Secondary | ICD-10-CM | POA: Diagnosis not present

## 2018-06-09 DIAGNOSIS — J45909 Unspecified asthma, uncomplicated: Secondary | ICD-10-CM | POA: Diagnosis not present

## 2018-06-09 DIAGNOSIS — Z299 Encounter for prophylactic measures, unspecified: Secondary | ICD-10-CM | POA: Diagnosis not present

## 2018-06-09 DIAGNOSIS — J069 Acute upper respiratory infection, unspecified: Secondary | ICD-10-CM | POA: Diagnosis not present

## 2018-06-09 DIAGNOSIS — F17210 Nicotine dependence, cigarettes, uncomplicated: Secondary | ICD-10-CM | POA: Diagnosis not present

## 2018-06-20 DIAGNOSIS — Z299 Encounter for prophylactic measures, unspecified: Secondary | ICD-10-CM | POA: Diagnosis not present

## 2018-06-20 DIAGNOSIS — Z6824 Body mass index (BMI) 24.0-24.9, adult: Secondary | ICD-10-CM | POA: Diagnosis not present

## 2018-06-20 DIAGNOSIS — R252 Cramp and spasm: Secondary | ICD-10-CM | POA: Diagnosis not present

## 2018-06-20 DIAGNOSIS — Z713 Dietary counseling and surveillance: Secondary | ICD-10-CM | POA: Diagnosis not present

## 2018-06-23 ENCOUNTER — Ambulatory Visit (INDEPENDENT_AMBULATORY_CARE_PROVIDER_SITE_OTHER): Payer: Medicare Other | Admitting: Licensed Clinical Social Worker

## 2018-06-23 ENCOUNTER — Encounter (HOSPITAL_COMMUNITY): Payer: Self-pay | Admitting: Licensed Clinical Social Worker

## 2018-06-23 DIAGNOSIS — F321 Major depressive disorder, single episode, moderate: Secondary | ICD-10-CM | POA: Diagnosis not present

## 2018-06-23 NOTE — Progress Notes (Signed)
   THERAPIST PROGRESS NOTE  Session Time: 9:00 am-9:45 am  Participation Level: Active  Behavioral Response: CasualAlertDepressed  Type of Therapy: Individual Therapy  Treatment Goals addressed: Coping  Interventions: CBT and Solution Focused  Summary: Paula Bailey is a 57 y.o. female who presents oriented x5 (person, place, situation, time and object), alert, casually dressed, appropriately groomed, average height, average weight, depressed and cooperative to address mood. Patient has minimal history of medical history. Patient has a history of mental health treatment including outpatient therapy and medication management. Patient denies symptoms of mania. She denies suicidal and homicidal ideations. Patient denies psychosis including auditory and visual hallucinations. Patient denies substance abuse. Patient denies a history of elopement. Patient is at low risk for lethality at this time.   Physically: Patient is doing much better physically. She was sick with pneumonia for 3 weeks and is still tired from that. She is bathing every other day and getting up and staying active.   Spiritually/values: No issues identified.  Relationship: Patient is getting along with her daughter and sister. She noted that sometimes her sister gets on her nerves because she questions how patient does things.  Emotional/Mental/Behavior: Patient reported feeling so much better. She came to the session by herself instead of being accompanied by her sister. She is happier, more active and cleaning up. After discussion, patient identified that her next step is taking care of herself such as putting on make up and going to get her nails done.   Patient engaged in session She responded well to interventions. Patient continues to meet criteria for Current moderate episode or major depressive disorder without prior episode. Patient will continue in outpatient therapy due to being the least restrictive service to meet her  needs. Patient made minimal progress on her goals at this time.   Suicidal/Homicidal: Negativewithout intent/plan  Therapist Response: Therapist reviewed patient's recent thoughts and behaviors. Therapist utilized CBT to address mood. Therapist processed patient's feelings to identify triggers for mood. Therapist had patient identify what has gone well for her. Therapist assisted patient in identifying her next step for managing her depression.   Plan: Return again in 4 weeks.  Diagnosis: Axis I: Current moderate episode of major depressive disorder without prior episode    Axis II: No diagnosis    Bynum Bellows, LCSW 06/23/2018

## 2018-06-26 DIAGNOSIS — R252 Cramp and spasm: Secondary | ICD-10-CM | POA: Diagnosis not present

## 2018-06-26 DIAGNOSIS — M79662 Pain in left lower leg: Secondary | ICD-10-CM | POA: Diagnosis not present

## 2018-07-06 DIAGNOSIS — M545 Low back pain: Secondary | ICD-10-CM | POA: Diagnosis not present

## 2018-07-06 DIAGNOSIS — M461 Sacroiliitis, not elsewhere classified: Secondary | ICD-10-CM | POA: Diagnosis not present

## 2018-07-24 DIAGNOSIS — F419 Anxiety disorder, unspecified: Secondary | ICD-10-CM | POA: Diagnosis not present

## 2018-07-24 DIAGNOSIS — M545 Low back pain: Secondary | ICD-10-CM | POA: Diagnosis not present

## 2018-07-24 DIAGNOSIS — M461 Sacroiliitis, not elsewhere classified: Secondary | ICD-10-CM | POA: Diagnosis not present

## 2018-07-24 DIAGNOSIS — Z79891 Long term (current) use of opiate analgesic: Secondary | ICD-10-CM | POA: Diagnosis not present

## 2018-08-04 DIAGNOSIS — Z6824 Body mass index (BMI) 24.0-24.9, adult: Secondary | ICD-10-CM | POA: Diagnosis not present

## 2018-08-04 DIAGNOSIS — L0291 Cutaneous abscess, unspecified: Secondary | ICD-10-CM | POA: Diagnosis not present

## 2018-08-04 DIAGNOSIS — J45909 Unspecified asthma, uncomplicated: Secondary | ICD-10-CM | POA: Diagnosis not present

## 2018-08-04 DIAGNOSIS — Z299 Encounter for prophylactic measures, unspecified: Secondary | ICD-10-CM | POA: Diagnosis not present

## 2018-08-04 DIAGNOSIS — J329 Chronic sinusitis, unspecified: Secondary | ICD-10-CM | POA: Diagnosis not present

## 2018-08-04 DIAGNOSIS — E7800 Pure hypercholesterolemia, unspecified: Secondary | ICD-10-CM | POA: Diagnosis not present

## 2018-08-10 DIAGNOSIS — F329 Major depressive disorder, single episode, unspecified: Secondary | ICD-10-CM | POA: Diagnosis not present

## 2018-08-10 DIAGNOSIS — E7800 Pure hypercholesterolemia, unspecified: Secondary | ICD-10-CM | POA: Diagnosis not present

## 2018-08-11 NOTE — Progress Notes (Deleted)
BH MD/PA/NP OP Progress Note  08/11/2018 9:47 AM Tessy Pawelski  MRN:  [id number]  Chief Complaint:  HPI: *** Visit Diagnosis: No diagnosis found.  Past Psychiatric History: Please see initial evaluation for full details. I have reviewed the history. No updates at this time.     Past Medical History:  Past Medical History:  Diagnosis Date  . Dyslipidemia    No past surgical history on file.  Family Psychiatric History: Please see initial evaluation for full details. I have reviewed the history. No updates at this time.     Family History: No family history on file.  Social History:  Social History   Socioeconomic History  . Marital status: Divorced    Spouse name: Not on file  . Number of children: Not on file  . Years of education: Not on file  . Highest education level: Not on file  Occupational History  . Not on file  Social Needs  . Financial resource strain: Not on file  . Food insecurity:    Worry: Not on file    Inability: Not on file  . Transportation needs:    Medical: Not on file    Non-medical: Not on file  Tobacco Use  . Smoking status: Current Every Day Smoker    Packs/day: 0.50    Years: 10.00    Pack years: 5.00  . Smokeless tobacco: Never Used  Substance and Sexual Activity  . Alcohol use: No    Frequency: Never  . Drug use: Not Currently  . Sexual activity: Not Currently  Lifestyle  . Physical activity:    Days per week: Not on file    Minutes per session: Not on file  . Stress: Not on file  Relationships  . Social connections:    Talks on phone: Not on file    Gets together: Not on file    Attends religious service: Not on file    Active member of club or organization: Not on file    Attends meetings of clubs or organizations: Not on file    Relationship status: Not on file  Other Topics Concern  . Not on file  Social History Narrative  . Not on file    Allergies: No Known Allergies  Metabolic Disorder Labs: No results  found for: HGBA1C, MPG No results found for: PROLACTIN No results found for: CHOL, TRIG, HDL, CHOLHDL, VLDL, LDLCALC No results found for: TSH  Therapeutic Level Labs: No results found for: LITHIUM No results found for: VALPROATE No components found for:  CBMZ  Current Medications: Current Outpatient Medications  Medication Sig Dispense Refill  . alendronate (FOSAMAX) 70 MG tablet Take 70 mg by mouth once a week.  6  . ALPRAZolam (XANAX) 0.25 MG tablet Take 0.25 mg by mouth every 12 (twelve) hours.  0  . fluticasone (FLONASE) 50 MCG/ACT nasal spray Place 2 sprays into both nostrils daily.  4  . gabapentin (NEURONTIN) 300 MG capsule TAKE ONE CAPSULE THREE TIMES DAILY  2  . oxyCODONE-acetaminophen (PERCOCET) 7.5-325 MG tablet Take 1 tablet by mouth every 8 (eight) hours as needed.  0  . sertraline (ZOLOFT) 100 MG tablet Take 1.5 tablets (150 mg total) by mouth daily. 135 tablet 0  . VENTOLIN HFA 108 (90 Base) MCG/ACT inhaler INHALE 1-2 PUFFS FOUR TIMES DAILY AS NEEDED  12   No current facility-administered medications for this visit.      Musculoskeletal: Strength & Muscle Tone: within normal limits Gait & Station: normal Patient  leans: N/A  Psychiatric Specialty Exam: ROS  There were no vitals taken for this visit.There is no height or weight on file to calculate BMI.  General Appearance: Fairly Groomed  Eye Contact:  Good  Speech:  Clear and Coherent  Volume:  Normal  Mood:  {BHH MOOD:22306}  Affect:  {Affect (PAA):22687}  Thought Process:  Coherent  Orientation:  Full (Time, Place, and Person)  Thought Content: Logical   Suicidal Thoughts:  {ST/HT (PAA):22692}  Homicidal Thoughts:  {ST/HT (PAA):22692}  Memory:  Immediate;   Good  Judgement:  {Judgement (PAA):22694}  Insight:  {Insight (PAA):22695}  Psychomotor Activity:  Normal  Concentration:  Concentration: Good and Attention Span: Good  Recall:  Good  Fund of Knowledge: Good  Language: Good  Akathisia:  No   Handed:  Right  AIMS (if indicated): not done  Assets:  Communication Skills Desire for Improvement  ADL's:  Intact  Cognition: WNL  Sleep:  {BHH GOOD/FAIR/POOR:22877}   Screenings:   Assessment and Plan:  Barnie AldermanMelissa Achille is a 57 y.o. year old female with a history of depression, r/o intellectual disability, dyslipidemia , who presents for follow up appointment for No diagnosis found.  # MDD, moderate, single episode without psychotic features  Patient reports improvement in neurovegetative symptoms and anxiety since the last appointment.  Will continue current dose to target depression .  Discussed behavioral activation .   #  r/o intellectual disability Patient was in special classes a child.  ADL independent.  Her sister will bring the record of disability evaluation.   Plan 1. Continue sertraline 150 mg at night  2. Return to clinic in three months for 15 mins  3.Bring disability evaluation at the next appointment  4. Ask your primary care doctor about thyroid test (She has been onXanax 0.25 mgBIDas needed for anxiety)  The patient demonstrates the following risk factors for suicide: Chronic risk factors for suicide include:psychiatric disorder ofdepression. Acute risk factorsfor suicide include: unemployment. Protective factorsfor this patient include: positive social support, coping skills and hope for the future. Considering these factors, the overall suicide risk at this point appears to below. Patientisappropriate for outpatient follow up.  Neysa Hottereina Shiron Whetsel, MD 08/11/2018, 9:47 AM

## 2018-08-17 ENCOUNTER — Ambulatory Visit (HOSPITAL_COMMUNITY): Payer: Medicare Other | Admitting: Psychiatry

## 2018-09-05 ENCOUNTER — Ambulatory Visit (HOSPITAL_COMMUNITY): Payer: Medicare Other | Admitting: Licensed Clinical Social Worker

## 2018-09-20 DIAGNOSIS — M545 Low back pain: Secondary | ICD-10-CM | POA: Diagnosis not present

## 2018-09-20 DIAGNOSIS — F419 Anxiety disorder, unspecified: Secondary | ICD-10-CM | POA: Diagnosis not present

## 2018-09-20 DIAGNOSIS — Z79891 Long term (current) use of opiate analgesic: Secondary | ICD-10-CM | POA: Diagnosis not present

## 2018-09-20 DIAGNOSIS — G894 Chronic pain syndrome: Secondary | ICD-10-CM | POA: Diagnosis not present

## 2018-09-20 DIAGNOSIS — M461 Sacroiliitis, not elsewhere classified: Secondary | ICD-10-CM | POA: Diagnosis not present

## 2018-10-30 DIAGNOSIS — F17210 Nicotine dependence, cigarettes, uncomplicated: Secondary | ICD-10-CM | POA: Diagnosis not present

## 2018-10-30 DIAGNOSIS — Z6824 Body mass index (BMI) 24.0-24.9, adult: Secondary | ICD-10-CM | POA: Diagnosis not present

## 2018-10-30 DIAGNOSIS — J45909 Unspecified asthma, uncomplicated: Secondary | ICD-10-CM | POA: Diagnosis not present

## 2018-10-30 DIAGNOSIS — L0291 Cutaneous abscess, unspecified: Secondary | ICD-10-CM | POA: Diagnosis not present

## 2018-10-30 DIAGNOSIS — J069 Acute upper respiratory infection, unspecified: Secondary | ICD-10-CM | POA: Diagnosis not present

## 2018-10-30 DIAGNOSIS — E7800 Pure hypercholesterolemia, unspecified: Secondary | ICD-10-CM | POA: Diagnosis not present

## 2018-10-30 DIAGNOSIS — Z299 Encounter for prophylactic measures, unspecified: Secondary | ICD-10-CM | POA: Diagnosis not present

## 2018-11-09 DIAGNOSIS — M5416 Radiculopathy, lumbar region: Secondary | ICD-10-CM | POA: Diagnosis not present

## 2018-11-09 DIAGNOSIS — G603 Idiopathic progressive neuropathy: Secondary | ICD-10-CM | POA: Diagnosis not present

## 2018-11-13 DIAGNOSIS — J40 Bronchitis, not specified as acute or chronic: Secondary | ICD-10-CM | POA: Diagnosis not present

## 2018-11-13 DIAGNOSIS — F329 Major depressive disorder, single episode, unspecified: Secondary | ICD-10-CM | POA: Diagnosis not present

## 2018-11-13 DIAGNOSIS — Z720 Tobacco use: Secondary | ICD-10-CM | POA: Diagnosis not present

## 2018-11-13 DIAGNOSIS — J441 Chronic obstructive pulmonary disease with (acute) exacerbation: Secondary | ICD-10-CM | POA: Diagnosis not present

## 2018-11-13 DIAGNOSIS — F17200 Nicotine dependence, unspecified, uncomplicated: Secondary | ICD-10-CM | POA: Diagnosis not present

## 2018-11-13 DIAGNOSIS — Z79899 Other long term (current) drug therapy: Secondary | ICD-10-CM | POA: Diagnosis not present

## 2018-11-13 DIAGNOSIS — R531 Weakness: Secondary | ICD-10-CM | POA: Diagnosis not present

## 2018-11-13 DIAGNOSIS — F419 Anxiety disorder, unspecified: Secondary | ICD-10-CM | POA: Diagnosis not present

## 2018-11-20 DIAGNOSIS — F419 Anxiety disorder, unspecified: Secondary | ICD-10-CM | POA: Diagnosis not present

## 2018-11-20 DIAGNOSIS — M461 Sacroiliitis, not elsewhere classified: Secondary | ICD-10-CM | POA: Diagnosis not present

## 2018-11-20 DIAGNOSIS — Z79891 Long term (current) use of opiate analgesic: Secondary | ICD-10-CM | POA: Diagnosis not present

## 2018-11-20 DIAGNOSIS — M545 Low back pain: Secondary | ICD-10-CM | POA: Diagnosis not present

## 2018-11-20 DIAGNOSIS — G894 Chronic pain syndrome: Secondary | ICD-10-CM | POA: Diagnosis not present

## 2018-11-23 ENCOUNTER — Telehealth (HOSPITAL_COMMUNITY): Payer: Self-pay | Admitting: Psychiatry

## 2018-11-23 NOTE — Telephone Encounter (Signed)
Received sertraline refill request from pharmacy (dispensed 3/6 for 90 days.) Please contact the patient to make follow up with phone/virtual visit.

## 2018-11-23 NOTE — Telephone Encounter (Signed)
Dr Vanetta Shawl Phone # is not in service. Per recording

## 2018-12-04 ENCOUNTER — Other Ambulatory Visit (HOSPITAL_COMMUNITY): Payer: Self-pay | Admitting: Psychiatry

## 2018-12-04 MED ORDER — SERTRALINE HCL 100 MG PO TABS: 150.0000 mg | ORAL_TABLET | Freq: Every day | ORAL | 0 refills | Status: DC

## 2018-12-06 DIAGNOSIS — Z299 Encounter for prophylactic measures, unspecified: Secondary | ICD-10-CM | POA: Diagnosis not present

## 2018-12-06 DIAGNOSIS — F17210 Nicotine dependence, cigarettes, uncomplicated: Secondary | ICD-10-CM | POA: Diagnosis not present

## 2018-12-06 DIAGNOSIS — J069 Acute upper respiratory infection, unspecified: Secondary | ICD-10-CM | POA: Diagnosis not present

## 2018-12-06 DIAGNOSIS — Z6824 Body mass index (BMI) 24.0-24.9, adult: Secondary | ICD-10-CM | POA: Diagnosis not present

## 2018-12-06 DIAGNOSIS — Z713 Dietary counseling and surveillance: Secondary | ICD-10-CM | POA: Diagnosis not present

## 2018-12-26 NOTE — Progress Notes (Signed)
Virtual Visit via Video Note  I connected with Paula Bailey on 12/28/18 at  4:00 PM EDT by a video enabled telemedicine application and verified that I am speaking with the correct person using two identifiers.   I discussed the limitations of evaluation and management by telemedicine and the availability of in person appointments. The patient expressed understanding and agreed to proceed.   I discussed the assessment and treatment plan with the patient. The patient was provided an opportunity to ask questions and all were answered. The patient agreed with the plan and demonstrated an understanding of the instructions.   The patient was advised to call back or seek an in-person evaluation if the symptoms worsen or if the condition fails to improve as anticipated.  I provided 15 minutes of non-face-to-face time during this encounter.   Paula Hottereina Terrion Gencarelli, MD    [facility] Progress Note  12/28/2018 4:14 PM Paula Bailey  MRN:  [id number]  Chief Complaint:  Chief Complaint    Depression; Follow-up     HPI:  This is a follow-up visit for depression.  She states that she has been feeling depressed again.  She has less motivation to go outside and does not want to do anything.  She states that the part of the reason she missed her appointment was due to depression.  She wonders why medication stopped working while she used to be doing well.  She had mild panic attacks the other day.  She agrees that she did comment about passive SI to her mother, although she adamantly denies any active plan or intent. She wishes to feel better. She agrees to take a walk regularly and gets sunlight during the day.  She has insomnia and takes naps during the day.  Feels fatigue.  She feels anxious at times.   Her sister is at the interview.  Paula Bailey appears to be depressed more than a few weeks. Although she did take a bath once a week, which is improving over the past few weeks, it has been less compared to  before. Paula Bailey stated to her sister that "I would not be here so long." The sister denies any safety concern.   Visit Diagnosis:    ICD-10-CM   1. Moderate episode of recurrent major depressive disorder (HCC) F33.1     Past Psychiatric History: Please see initial evaluation for full details. I have reviewed the history. No updates at this time.     Past Medical History:  Past Medical History:  Diagnosis Date  . Dyslipidemia    No past surgical history on file.  Family Psychiatric History: Please see initial evaluation for full details. I have reviewed the history. No updates at this time.     Family History: No family history on file.  Social History:  Social History   Socioeconomic History  . Marital status: Divorced    Spouse name: Not on file  . Number of children: Not on file  . Years of education: Not on file  . Highest education level: Not on file  Occupational History  . Not on file  Social Needs  . Financial resource strain: Not on file  . Food insecurity:    Worry: Not on file    Inability: Not on file  . Transportation needs:    Medical: Not on file    Non-medical: Not on file  Tobacco Use  . Smoking status: Current Every Day Smoker    Packs/day: 0.50    Years: 10.00  Pack years: 5.00  . Smokeless tobacco: Never Used  Substance and Sexual Activity  . Alcohol use: No    Frequency: Never  . Drug use: Not Currently  . Sexual activity: Not Currently  Lifestyle  . Physical activity:    Days per week: Not on file    Minutes per session: Not on file  . Stress: Not on file  Relationships  . Social connections:    Talks on phone: Not on file    Gets together: Not on file    Attends religious service: Not on file    Active member of club or organization: Not on file    Attends meetings of clubs or organizations: Not on file    Relationship status: Not on file  Other Topics Concern  . Not on file  Social History Narrative  . Not on file     Allergies: No Known Allergies  Metabolic Disorder Labs: No results found for: HGBA1C, MPG No results found for: PROLACTIN No results found for: CHOL, TRIG, HDL, CHOLHDL, VLDL, LDLCALC No results found for: TSH  Therapeutic Level Labs: No results found for: LITHIUM No results found for: VALPROATE No components found for:  CBMZ  Current Medications: Current Outpatient Medications  Medication Sig Dispense Refill  . alendronate (FOSAMAX) 70 MG tablet Take 70 mg by mouth once a week.  6  . ALPRAZolam (XANAX) 0.25 MG tablet Take 0.25 mg by mouth every 12 (twelve) hours.  0  . fluticasone (FLONASE) 50 MCG/ACT nasal spray Place 2 sprays into both nostrils daily.  4  . gabapentin (NEURONTIN) 300 MG capsule TAKE ONE CAPSULE THREE TIMES DAILY  2  . oxyCODONE-acetaminophen (PERCOCET) 7.5-325 MG tablet Take 1 tablet by mouth every 8 (eight) hours as needed.  0  . sertraline (ZOLOFT) 100 MG tablet Take 1.5 tablets (150 mg total) by mouth daily. 135 tablet 0  . VENTOLIN HFA 108 (90 Base) MCG/ACT inhaler INHALE 1-2 PUFFS FOUR TIMES DAILY AS NEEDED  12   No current facility-administered medications for this visit.      Musculoskeletal: Strength & Muscle Tone: N/A Gait & Station: N/A Patient leans: N/A  Psychiatric Specialty Exam: Review of Systems  Psychiatric/Behavioral: Positive for depression. Negative for hallucinations, memory loss, substance abuse and suicidal ideas. The patient is nervous/anxious and has insomnia.   All other systems reviewed and are negative.   There were no vitals taken for this visit.There is no height or weight on file to calculate BMI.  General Appearance: Fairly Groomed  Eye Contact:  Good  Speech:  Clear and Coherent  Volume:  Normal  Mood:  Depressed  Affect:  Appropriate, Congruent and Restricted  Thought Process:  Coherent  Orientation:  Full (Time, Place, and Person)  Thought Content: Logical   Suicidal Thoughts:  No  Homicidal Thoughts:  No   Memory:  Immediate;   Good  Judgement:  Good  Insight:  Present  Psychomotor Activity:  Normal  Concentration:  Concentration: Good and Attention Span: Good  Recall:  Good  Fund of Knowledge: Good  Language: Good  Akathisia:  No  Handed:  Right  AIMS (if indicated): not done  Assets:  Communication Skills Desire for Improvement  ADL's:  Intact  Cognition: WNL  Sleep:  Poor   Screenings:   Assessment and Plan:  Paula Bailey is a 58 y.o. year old female with a history of depression,  r/o intellectual disability, dyslipidemia, who presents for follow up appointment for Moderate episode of recurrent  major depressive disorder (HCC)  # MDD, moderate, recurrent episode without psychotic features Patient has relapse in her depressive symptoms despite being adherent on medication.  Will uptitrate sertraline to target depression.  Will consider adjunctive treatment for depression if she has limited benefit from up titration.  Discussed behavioral activation and sleep hygiene.   #  r/o intellectual disability Patient was in a special class as a child.  ADL independent.  Her sister will bring the records of disability evaluation.  Plan 1. Increase sertraline 200 mg at night  2. Next appointment 6/1 at 2 PM for 20 mins, video 3.Bring disability evaluation at the next appointment  4. Ask your primary care doctor about thyroid test (She has been onXanax 0.25 mgBIDas needed for anxiety)  Past trials of medication:Paxil, duloxetine (drowsiness), bupropion, Xanax  The patient demonstrates the following risk factors for suicide: Chronic risk factors for suicide include:psychiatric disorder ofdepression. Acute risk factorsfor suicide include: unemployment. Protective factorsfor this patient include: positive social support, coping skills and hope for the future. Considering these factors, the overall suicide risk at this point appears to below. Patientisappropriate for outpatient  follow up.   Paula Hotter, MD 12/28/2018, 4:14 PM

## 2018-12-28 ENCOUNTER — Ambulatory Visit (INDEPENDENT_AMBULATORY_CARE_PROVIDER_SITE_OTHER): Payer: Medicaid Other | Admitting: Psychiatry

## 2018-12-28 ENCOUNTER — Encounter (HOSPITAL_COMMUNITY): Payer: Self-pay | Admitting: Psychiatry

## 2018-12-28 ENCOUNTER — Other Ambulatory Visit: Payer: Self-pay

## 2018-12-28 DIAGNOSIS — F331 Major depressive disorder, recurrent, moderate: Secondary | ICD-10-CM

## 2018-12-28 MED ORDER — SERTRALINE HCL 100 MG PO TABS: 200.0000 mg | ORAL_TABLET | Freq: Every day | ORAL | 0 refills | Status: DC

## 2018-12-28 NOTE — Patient Instructions (Addendum)
1. Increase sertraline 200 mg at night  2. Next appointment 6/1 at 2 PM for 20 mins, video 3. Ask your primary care doctor about thyroid test

## 2019-01-12 DIAGNOSIS — Z713 Dietary counseling and surveillance: Secondary | ICD-10-CM | POA: Diagnosis not present

## 2019-01-12 DIAGNOSIS — W57XXXA Bitten or stung by nonvenomous insect and other nonvenomous arthropods, initial encounter: Secondary | ICD-10-CM | POA: Diagnosis not present

## 2019-01-12 DIAGNOSIS — Z299 Encounter for prophylactic measures, unspecified: Secondary | ICD-10-CM | POA: Diagnosis not present

## 2019-01-12 DIAGNOSIS — Z6825 Body mass index (BMI) 25.0-25.9, adult: Secondary | ICD-10-CM | POA: Diagnosis not present

## 2019-01-12 DIAGNOSIS — F419 Anxiety disorder, unspecified: Secondary | ICD-10-CM | POA: Diagnosis not present

## 2019-01-23 NOTE — Progress Notes (Signed)
Virtual Visit via Telephone Note  I connected with Paula AldermanMelissa Yaney on 01/29/19 at  2:00 PM EDT by telephone and verified that I am speaking with the correct person using two identifiers.   I discussed the limitations, risks, security and privacy concerns of performing an evaluation and management service by telephone and the availability of in person appointments. I also discussed with the patient that there may be a patient responsible charge related to this service. The patient expressed understanding and agreed to proceed.      I discussed the assessment and treatment plan with the patient. The patient was provided an opportunity to ask questions and all were answered. The patient agreed with the plan and demonstrated an understanding of the instructions.   The patient was advised to call back or seek an in-person evaluation if the symptoms worsen or if the condition fails to improve as anticipated.  I provided 15 minutes of non-face-to-face time during this encounter.   Neysa Hottereina Sabrinia Prien, MD    [facility]  01/29/2019 2:25 PM Paula AldermanMelissa Alers  MRN:  [id number]  Chief Complaint:  Chief Complaint    Depression; Follow-up     HPI:  This is a follow-up visit for depression.  She states that she continues to have anhedonia and very low energy. She cannot think of any reason which makes her feel this way.  She has been going outside, walking to the past every day so that she can be active.  She has started to take a bath every day.  She may not be wearing nice clothes wishes. She hopes to change her medication as she is not doing well. She sleeps better. She has fair appetite. She has fair concentration. She denies SI. She feels anxious, tense at times. She denies panic attacks.    Visit Diagnosis:    ICD-10-CM   1. Moderate episode of recurrent major depressive disorder (HCC) F33.1     Past Psychiatric History: Please see initial evaluation for full details. I have reviewed  the history. No updates at this time.     Past Medical History:  Past Medical History:  Diagnosis Date  . Dyslipidemia    No past surgical history on file.  Family Psychiatric History: Please see initial evaluation for full details. I have reviewed the history. No updates at this time.     Family History: No family history on file.  Social History:  Social History   Socioeconomic History  . Marital status: Divorced    Spouse name: Not on file  . Number of children: Not on file  . Years of education: Not on file  . Highest education level: Not on file  Occupational History  . Not on file  Social Needs  . Financial resource strain: Not on file  . Food insecurity:    Worry: Not on file    Inability: Not on file  . Transportation needs:    Medical: Not on file    Non-medical: Not on file  Tobacco Use  . Smoking status: Current Every Day Smoker    Packs/day: 0.50    Years: 10.00    Pack years: 5.00  . Smokeless tobacco: Never Used  Substance and Sexual Activity  . Alcohol use: No    Frequency: Never  . Drug use: Not Currently  . Sexual activity: Not Currently  Lifestyle  . Physical activity:    Days per week: Not on file    Minutes per session: Not on file  .  Stress: Not on file  Relationships  . Social connections:    Talks on phone: Not on file    Gets together: Not on file    Attends religious service: Not on file    Active member of club or organization: Not on file    Attends meetings of clubs or organizations: Not on file    Relationship status: Not on file  Other Topics Concern  . Not on file  Social History Narrative  . Not on file    Allergies: No Known Allergies  Metabolic Disorder Labs: No results found for: HGBA1C, MPG No results found for: PROLACTIN No results found for: CHOL, TRIG, HDL, CHOLHDL, VLDL, LDLCALC No results found for: TSH  Therapeutic Level Labs: No results found for: LITHIUM No results found for: VALPROATE No  components found for:  CBMZ  Current Medications: Current Outpatient Medications  Medication Sig Dispense Refill  . alendronate (FOSAMAX) 70 MG tablet Take 70 mg by mouth once a week.  6  . ALPRAZolam (XANAX) 0.25 MG tablet Take 0.25 mg by mouth every 12 (twelve) hours.  0  . fluticasone (FLONASE) 50 MCG/ACT nasal spray Place 2 sprays into both nostrils daily.  4  . gabapentin (NEURONTIN) 300 MG capsule TAKE ONE CAPSULE THREE TIMES DAILY  2  . oxyCODONE-acetaminophen (PERCOCET) 7.5-325 MG tablet Take 1 tablet by mouth every 8 (eight) hours as needed.  0  . sertraline (ZOLOFT) 100 MG tablet Take 2 tablets (200 mg total) by mouth daily. 180 tablet 0  . VENTOLIN HFA 108 (90 Base) MCG/ACT inhaler INHALE 1-2 PUFFS FOUR TIMES DAILY AS NEEDED  12   No current facility-administered medications for this visit.      Musculoskeletal: Strength & Muscle Tone: N/A Gait & Station: N/A Patient leans: N/A  Psychiatric Specialty Exam: Review of Systems  Psychiatric/Behavioral: Positive for depression. Negative for hallucinations, memory loss, substance abuse and suicidal ideas. The patient is nervous/anxious. The patient does not have insomnia.   All other systems reviewed and are negative.   There were no vitals taken for this visit.There is no height or weight on file to calculate BMI.  General Appearance: NA  Eye Contact:  NA  Speech:  Clear and Coherent  Volume:  Normal  Mood:  Depressed  Affect:  NA  Thought Process:  Coherent  Orientation:  Full (Time, Place, and Person)  Thought Content: Logical   Suicidal Thoughts:  No  Homicidal Thoughts:  No  Memory:  Immediate;   Good  Judgement:  Good  Insight:  Fair  Psychomotor Activity:  Normal  Concentration:  Concentration: Good and Attention Span: Good  Recall:  Good  Fund of Knowledge: Good  Language: Good  Akathisia:  No  Handed:  Right  AIMS (if indicated): not done  Assets:  Communication Skills Desire for Improvement  ADL's:   Intact  Cognition: WNL  Sleep:  Fair   Screenings:   Assessment and Plan:  Paula Bailey is a 58 y.o. year old female with a history of depression,  r/o intellectual disability, dyslipidemia,, who presents for follow up appointment for Moderate episode of recurrent major depressive disorder (HCC)  # MDD, moderate, recurrent without psychotic features Patient continues to report depressive symptoms, although she is more engaged in daily activity since up titration of sertraline.  Will continue current dose of sertraline to see if it exerts its full effect for depression.  Will consider adjunctive treatment for depression the future if she has limited benefit.  Discussed behavioral activation.  She agrees to follow-up with her therapist.   # r/o intellectual disability Patient was in a special class as a child.  ADL independent.  Her sister will bring the records of disability evaluation.  Plan 1. Continue sertraline 200 mg at night  2. Next appointment 7/13 at 11: 20 for 20 mins, video 3.Bring disability evaluation at the next appointment  4. Ask your primary care doctor about thyroid test (She has been onXanax 0.25 mgBIDas needed for anxiety)  Past trials of medication:Paxil, duloxetine (drowsiness), bupropion, Xanax  The patient demonstrates the following risk factors for suicide: Chronic risk factors for suicide include:psychiatric disorder ofdepression. Acute risk factorsfor suicide include: unemployment. Protective factorsfor this patient include: positive social support, coping skills and hope for the future. Considering these factors, the overall suicide risk at this point appears to below  [name], MD 01/29/2019, 2:25 PM

## 2019-01-29 ENCOUNTER — Encounter (HOSPITAL_COMMUNITY): Payer: Self-pay | Admitting: Psychiatry

## 2019-01-29 ENCOUNTER — Other Ambulatory Visit: Payer: Self-pay

## 2019-01-29 ENCOUNTER — Ambulatory Visit (INDEPENDENT_AMBULATORY_CARE_PROVIDER_SITE_OTHER): Payer: Medicare Other | Admitting: Psychiatry

## 2019-01-29 DIAGNOSIS — F331 Major depressive disorder, recurrent, moderate: Secondary | ICD-10-CM | POA: Diagnosis not present

## 2019-01-29 NOTE — Patient Instructions (Addendum)
1. Continue sertraline 200 mg at night  2. Next appointment 7/13 at 11: 20  3.Bring disability evaluation at the next appointment  4. Ask your primary care doctor about thyroid test

## 2019-01-30 ENCOUNTER — Telehealth (HOSPITAL_COMMUNITY): Payer: Self-pay | Admitting: *Deleted

## 2019-01-30 NOTE — Telephone Encounter (Signed)
Noted, thanks!

## 2019-01-30 NOTE — Telephone Encounter (Signed)
Dr Vanetta Shawl Unable to make appt for this patient due to Sharia Reeve  Dismissed her on 09/18/2018

## 2019-02-20 DIAGNOSIS — M545 Low back pain: Secondary | ICD-10-CM | POA: Diagnosis not present

## 2019-02-20 DIAGNOSIS — F419 Anxiety disorder, unspecified: Secondary | ICD-10-CM | POA: Diagnosis not present

## 2019-02-20 DIAGNOSIS — G894 Chronic pain syndrome: Secondary | ICD-10-CM | POA: Diagnosis not present

## 2019-02-20 DIAGNOSIS — Z79891 Long term (current) use of opiate analgesic: Secondary | ICD-10-CM | POA: Diagnosis not present

## 2019-02-20 DIAGNOSIS — M461 Sacroiliitis, not elsewhere classified: Secondary | ICD-10-CM | POA: Diagnosis not present

## 2019-03-05 NOTE — Progress Notes (Signed)
Virtual Visit via Telephone Note  I connected with Paula AldermanMelissa Bailey on 03/12/19 at 11:20 AM EDT by telephone and verified that I am speaking with the correct person using two identifiers.   I discussed the limitations, risks, security and privacy concerns of performing an evaluation and management service by telephone and the availability of in person appointments. I also discussed with the patient that there may be a patient responsible charge related to this service. The patient expressed understanding and agreed to proceed.    I discussed the assessment and treatment plan with the patient. The patient was provided an opportunity to ask questions and all were answered. The patient agreed with the plan and demonstrated an understanding of the instructions.   The patient was advised to call back or seek an in-person evaluation if the symptoms worsen or if the condition fails to improve as anticipated.  I provided 15 minutes of non-face-to-face time during this encounter.   Paula Bailey Paula Whitelaw, MD    [facility]  03/12/2019 11:40 AM Paula AldermanMelissa Sinagra  MRN:  [id number]  Chief Complaint:  Chief Complaint    Depression; Follow-up     HPI:  This is a follow-up appointment for depression.  She states that she believes she has been doing better.  She has started to take a bath every other day.  She tries to go outside, and she makes sure to take a walk with her sister routinely. She may watch TV during the day. She has not taken a nap as she used to.  She agrees that she needs to continue to work on her part (being active) while taking medication for depression.  Although she still does not feel she is back to herself, she believes things has been getting better.  She denies insomnia.  She feels less depressed.  She has more motivation and energy.  Fair concentration.  She denies SI.  She feels anxious and tense at times.  She denies panic attacks.    Visit Diagnosis:    ICD-10-CM   1.  Mild episode of recurrent major depressive disorder (HCC)  F33.0     Past Psychiatric History: Please see initial evaluation for full details. I have reviewed the history. No updates at this time.     Past Medical History:  Past Medical History:  Diagnosis Date  . Dyslipidemia    No past surgical history on file.  Family Psychiatric History: Please see initial evaluation for full details. I have reviewed the history. No updates at this time.     Family History: No family history on file.  Social History:  Social History   Socioeconomic History  . Marital status: Divorced    Spouse name: Not on file  . Number of children: Not on file  . Years of education: Not on file  . Highest education level: Not on file  Occupational History  . Not on file  Social Needs  . Financial resource strain: Not on file  . Food insecurity    Worry: Not on file    Inability: Not on file  . Transportation needs    Medical: Not on file    Non-medical: Not on file  Tobacco Use  . Smoking status: Current Every Day Smoker    Packs/day: 0.50    Years: 10.00    Pack years: 5.00  . Smokeless tobacco: Never Used  Substance and Sexual Activity  . Alcohol use: No    Frequency: Never  . Drug use:  Not Currently  . Sexual activity: Not Currently  Lifestyle  . Physical activity    Days per week: Not on file    Minutes per session: Not on file  . Stress: Not on file  Relationships  . Social Herbalist on phone: Not on file    Gets together: Not on file    Attends religious service: Not on file    Active member of club or organization: Not on file    Attends meetings of clubs or organizations: Not on file    Relationship status: Not on file  Other Topics Concern  . Not on file  Social History Narrative  . Not on file    Allergies: No Known Allergies  Metabolic Disorder Labs: No results found for: HGBA1C, MPG No results found for: PROLACTIN No results found for: CHOL, TRIG,  HDL, CHOLHDL, VLDL, LDLCALC No results found for: TSH  Therapeutic Level Labs: No results found for: LITHIUM No results found for: VALPROATE No components found for:  CBMZ  Current Medications: Current Outpatient Medications  Medication Sig Dispense Refill  . alendronate (FOSAMAX) 70 MG tablet Take 70 mg by mouth once a week.  6  . ALPRAZolam (XANAX) 0.25 MG tablet Take 0.25 mg by mouth every 12 (twelve) hours.  0  . fluticasone (FLONASE) 50 MCG/ACT nasal spray Place 2 sprays into both nostrils daily.  4  . gabapentin (NEURONTIN) 300 MG capsule TAKE ONE CAPSULE THREE TIMES DAILY  2  . oxyCODONE-acetaminophen (PERCOCET) 7.5-325 MG tablet Take 1 tablet by mouth every 8 (eight) hours as needed.  0  . sertraline (ZOLOFT) 100 MG tablet Take 2 tablets (200 mg total) by mouth daily. 180 tablet 0  . VENTOLIN HFA 108 (90 Base) MCG/ACT inhaler INHALE 1-2 PUFFS FOUR TIMES DAILY AS NEEDED  12   No current facility-administered medications for this visit.      Musculoskeletal: Strength & Muscle Tone: N/A Gait & Station: N/A Patient leans: N/A  Psychiatric Specialty Exam: Review of Systems  Psychiatric/Behavioral: Positive for depression. Negative for hallucinations, memory loss, substance abuse and suicidal ideas. The patient is not nervous/anxious and does not have insomnia.   All other systems reviewed and are negative.   There were no vitals taken for this visit.There is no height or weight on file to calculate BMI.  General Appearance: NA  Eye Contact:  NA  Speech:  Clear and Coherent  Volume:  Normal  Mood:  "better"  Affect:  NA  Thought Process:  Coherent  Orientation:  Full (Time, Place, and Person)  Thought Content: Logical   Suicidal Thoughts:  No  Homicidal Thoughts:  No  Memory:  Immediate;   Good  Judgement:  Good  Insight:  Fair  Psychomotor Activity:  Normal  Concentration:  Concentration: Good and Attention Span: Good  Recall:  Good  Fund of Knowledge: Good   Language: Good  Akathisia:  No  Handed:  Right  AIMS (if indicated): not done  Assets:  Communication Skills Desire for Improvement  ADL's:  Intact  Cognition: WNL  Sleep:  Good   Screenings:   Assessment and Plan:  Taleeya Blondin is a 58 y.o. year old female with a history of depression, r/o intellectual disability, dyslipidemia,, who presents for follow up appointment for depression.   # MDD, mild, recurrent without psychotic features There has been steady improvement in depressive symptoms since up titration of sertraline.  Will continue current dose of sertraline to target depression.  Discussed at length regarding behavioral activation.  Noted that although she will greatly benefit from CBT, she is dismissed from the practice due to no shows.  # r/o intellectual disability Patient was in a special class as a child.ADL independent.Her sister will bring the records of disability evaluation. No safety concern.   Plan I have reviewed and updated plans as below 1.Continuesertraline 200 mg at night  2.Next appointment 10/5 at 9 AM for 20 mins, phone 3.Bring disability evaluation at the next appointment  4. Ask your primary care doctor about thyroid test (She has been onXanax 0.5 mgBIDas needed for anxiety)  Past trials of medication:Paxil, duloxetine (drowsiness),bupropion, Xanax  The patient demonstrates the following risk factors for suicide: Chronic risk factors for suicide include:psychiatric disorder ofdepression. Acute risk factorsfor suicide include: unemployment. Protective factorsfor this patient include: positive social support, coping skills and hope for the future. Considering these factors, the overall suicide risk at this point appears to below  Paula Bailey Toini Failla, MD 03/12/2019, 11:40 AM

## 2019-03-12 ENCOUNTER — Other Ambulatory Visit: Payer: Self-pay

## 2019-03-12 ENCOUNTER — Encounter (HOSPITAL_COMMUNITY): Payer: Self-pay | Admitting: Psychiatry

## 2019-03-12 ENCOUNTER — Ambulatory Visit (INDEPENDENT_AMBULATORY_CARE_PROVIDER_SITE_OTHER): Payer: Medicare Other | Admitting: Psychiatry

## 2019-03-12 DIAGNOSIS — F330 Major depressive disorder, recurrent, mild: Secondary | ICD-10-CM

## 2019-03-12 MED ORDER — SERTRALINE HCL 100 MG PO TABS: 200.0000 mg | ORAL_TABLET | Freq: Every day | ORAL | 0 refills | Status: DC

## 2019-03-12 NOTE — Patient Instructions (Signed)
1.Continuesertraline 200 mg at night  2.Next appointment 10/5 at 9 AM

## 2019-03-20 DIAGNOSIS — M461 Sacroiliitis, not elsewhere classified: Secondary | ICD-10-CM | POA: Diagnosis not present

## 2019-03-20 DIAGNOSIS — Z79891 Long term (current) use of opiate analgesic: Secondary | ICD-10-CM | POA: Diagnosis not present

## 2019-03-20 DIAGNOSIS — F419 Anxiety disorder, unspecified: Secondary | ICD-10-CM | POA: Diagnosis not present

## 2019-03-20 DIAGNOSIS — M545 Low back pain: Secondary | ICD-10-CM | POA: Diagnosis not present

## 2019-03-20 DIAGNOSIS — G894 Chronic pain syndrome: Secondary | ICD-10-CM | POA: Diagnosis not present

## 2019-04-06 DIAGNOSIS — E7800 Pure hypercholesterolemia, unspecified: Secondary | ICD-10-CM | POA: Diagnosis not present

## 2019-04-06 DIAGNOSIS — F329 Major depressive disorder, single episode, unspecified: Secondary | ICD-10-CM | POA: Diagnosis not present

## 2019-04-27 DIAGNOSIS — M545 Low back pain: Secondary | ICD-10-CM | POA: Diagnosis not present

## 2019-04-27 DIAGNOSIS — M461 Sacroiliitis, not elsewhere classified: Secondary | ICD-10-CM | POA: Diagnosis not present

## 2019-05-16 DIAGNOSIS — Z79891 Long term (current) use of opiate analgesic: Secondary | ICD-10-CM | POA: Diagnosis not present

## 2019-05-16 DIAGNOSIS — F419 Anxiety disorder, unspecified: Secondary | ICD-10-CM | POA: Diagnosis not present

## 2019-05-16 DIAGNOSIS — M461 Sacroiliitis, not elsewhere classified: Secondary | ICD-10-CM | POA: Diagnosis not present

## 2019-05-16 DIAGNOSIS — M545 Low back pain: Secondary | ICD-10-CM | POA: Diagnosis not present

## 2019-05-24 NOTE — Progress Notes (Signed)
Virtual Visit via Telephone Note  I connected with Paula Bailey on 06/04/19 at  9:00 AM EDT by telephone and verified that I am speaking with the correct person using two identifiers.   I discussed the limitations, risks, security and privacy concerns of performing an evaluation and management service by telephone and the availability of in person appointments. I also discussed with the patient that there may be a patient responsible charge related to this service. The patient expressed understanding and agreed to proceed.      I discussed the assessment and treatment plan with the patient. The patient was provided an opportunity to ask questions and all were answered. The patient agreed with the plan and demonstrated an understanding of the instructions.   The patient was advised to call back or seek an in-person evaluation if the symptoms worsen or if the condition fails to improve as anticipated.  I provided 15 minutes of non-face-to-face time during this encounter.   [name], MD    [facility] MD/PA/NP OP Progress Note  06/04/2019 9:19 AM Paula Bailey  MRN:  [id number]  Chief Complaint:  Chief Complaint    Depression; Follow-up     HPI:  This is follow-up appointment for depression.  She states that she feels good since her last appointment.  She feels that her medication is in her system.  She tries to take a walk regularly.  She does not have any crying spells, which she believes is a good sign.  She states that her sister has been busy at work as she is covering for coworkers.  She occasionally feels anxious when she thinks that she needs to take a bath.  She is unsure why she feels anxious. She usually goes outside and does something else to ease her anxiety. She takes a bath twice a day. She hopes to do it four times a day.  She has occasional insomnia.  She feels mildly fatigued.  She has fair concentration.  She has good appetite.  She feels less depressed.  She denies  irritability.  She denies panic attacks.  She takes Xanax a few times per week when she gets anxious.   Routine- takes care of her cat, takes a walk, watches TV, talks with her sister, occasionally takes a nap ( )  Visit Diagnosis:    ICD-10-CM   1. Mild episode of recurrent major depressive disorder (HCC)  F33.0     Past Psychiatric History: Please see initial evaluation for full details. I have reviewed the history. No updates at this time.     Past Medical History:  Past Medical History:  Diagnosis Date  . Dyslipidemia    No past surgical history on file.  Family Psychiatric History: Please see initial evaluation for full details. I have reviewed the history. No updates at this time.     Family History: No family history on file.  Social History:  Social History   Socioeconomic History  . Marital status: Divorced    Spouse name: Not on file  . Number of children: Not on file  . Years of education: Not on file  . Highest education level: Not on file  Occupational History  . Not on file  Social Needs  . Financial resource strain: Not on file  . Food insecurity    Worry: Not on file    Inability: Not on file  . Transportation needs    Medical: Not on file    Non-medical: Not on file  Tobacco Use  .  Smoking status: Current Every Day Smoker    Packs/day: 0.50    Years: 10.00    Pack years: 5.00  . Smokeless tobacco: Never Used  Substance and Sexual Activity  . Alcohol use: No    Frequency: Never  . Drug use: Not Currently  . Sexual activity: Not Currently  Lifestyle  . Physical activity    Days per week: Not on file    Minutes per session: Not on file  . Stress: Not on file  Relationships  . Social Herbalist on phone: Not on file    Gets together: Not on file    Attends religious service: Not on file    Active member of club or organization: Not on file    Attends meetings of clubs or organizations: Not on file    Relationship status:  Not on file  Other Topics Concern  . Not on file  Social History Narrative  . Not on file    Allergies: No Known Allergies  Metabolic Disorder Labs: No results found for: HGBA1C, MPG No results found for: PROLACTIN No results found for: CHOL, TRIG, HDL, CHOLHDL, VLDL, LDLCALC No results found for: TSH  Therapeutic Level Labs: No results found for: LITHIUM No results found for: VALPROATE No components found for:  CBMZ  Current Medications: Current Outpatient Medications  Medication Sig Dispense Refill  . alendronate (FOSAMAX) 70 MG tablet Take 70 mg by mouth once a week.  6  . ALPRAZolam (XANAX) 0.25 MG tablet Take 0.25 mg by mouth every 12 (twelve) hours.  0  . fluticasone (FLONASE) 50 MCG/ACT nasal spray Place 2 sprays into both nostrils daily.  4  . gabapentin (NEURONTIN) 300 MG capsule TAKE ONE CAPSULE THREE TIMES DAILY  2  . oxyCODONE-acetaminophen (PERCOCET) 7.5-325 MG tablet Take 1 tablet by mouth every 8 (eight) hours as needed.  0  . [START ON 06/11/2019] sertraline (ZOLOFT) 100 MG tablet Take 2 tablets (200 mg total) by mouth daily. 180 tablet 1  . VENTOLIN HFA 108 (90 Base) MCG/ACT inhaler INHALE 1-2 PUFFS FOUR TIMES DAILY AS NEEDED  12   No current facility-administered medications for this visit.      Musculoskeletal: Strength & Muscle Tone: N/A Gait & Station: N/A Patient leans: N/A  Psychiatric Specialty Exam: Review of Systems  Psychiatric/Behavioral: Positive for depression. Negative for hallucinations, memory loss, substance abuse and suicidal ideas. The patient is nervous/anxious and has insomnia.   All other systems reviewed and are negative.   There were no vitals taken for this visit.There is no height or weight on file to calculate BMI.  General Appearance: NA  Eye Contact:  NA  Speech:  Clear and Coherent  Volume:  Normal  Mood:  "better"  Affect:  NA  Thought Process:  Coherent  Orientation:  Full (Time, Place, and Person)  Thought  Content: Logical   Suicidal Thoughts:  No  Homicidal Thoughts:  No  Memory:  Immediate;   Good  Judgement:  Good  Insight:  Fair  Psychomotor Activity:  Normal  Concentration:  Concentration: Good and Attention Span: Good  Recall:  Good  Fund of Knowledge: Good  Language: Good  Akathisia:  No  Handed:  Right  AIMS (if indicated): not done  Assets:  Communication Skills Desire for Improvement  ADL's:  Intact  Cognition: WNL  Sleep:  Fair   Screenings:   Assessment and Plan:  Paula Bailey is a 58 y.o. year old female with a history  of depression, r/o intellectual disability, dyslipidemia, who presents for follow up appointment for Mild episode of recurrent major depressive disorder (HCC)  # MDD, mild recurrent without psychotic features There has been overall improvement in depressive symptoms since up titration of sertraline.  Will continue current dose of sertraline to target depression.  Discussed behavioral activation.  Noted that although she would greatly benefit from CBT, she is dismissed from the practice due to no-shows.   # r/o intellectual disability Patient was in a special class as a child.  ADL independent.  Also it has been requested to bring the records of disability evaluation, her sister has not been able to bring the one to the office. No behavioral/safety concerns.    Plan I have reviewed and updated plans as below 1.Continuesertraline 200 mg at night  2.Next appointment: in January 3.Bring disability evaluation at the next appointment  4. TSH reportedly normal per patient (not known when it was checked the last time) (She has been onXanax 0.5 mgdaily as needed for anxiety by other provider)  Past trials of medication:Paxil, duloxetine (drowsiness),bupropion, Xanax  The patient demonstrates the following risk factors for suicide: Chronic risk factors for suicide include:psychiatric disorder ofdepression. Acute risk factorsfor suicide  include: unemployment. Protective factorsfor this patient include: positive social support, coping skills and hope for the future. Considering these factors, the overall suicide risk at this point appears to below  Neysa Hottereina Shyloh Derosa, MD 06/04/2019, 9:19 AM

## 2019-06-04 ENCOUNTER — Encounter (HOSPITAL_COMMUNITY): Payer: Self-pay | Admitting: Psychiatry

## 2019-06-04 ENCOUNTER — Ambulatory Visit (INDEPENDENT_AMBULATORY_CARE_PROVIDER_SITE_OTHER): Payer: Medicare Other | Admitting: Psychiatry

## 2019-06-04 ENCOUNTER — Other Ambulatory Visit: Payer: Self-pay

## 2019-06-04 DIAGNOSIS — F330 Major depressive disorder, recurrent, mild: Secondary | ICD-10-CM

## 2019-06-04 MED ORDER — SERTRALINE HCL 100 MG PO TABS: 200.0000 mg | ORAL_TABLET | Freq: Every day | ORAL | 1 refills | Status: DC

## 2019-06-04 NOTE — Patient Instructions (Signed)
1.Continuesertraline 200 mg at night  2.Next appointment: in January

## 2019-06-14 DIAGNOSIS — F17210 Nicotine dependence, cigarettes, uncomplicated: Secondary | ICD-10-CM | POA: Diagnosis not present

## 2019-06-14 DIAGNOSIS — Z299 Encounter for prophylactic measures, unspecified: Secondary | ICD-10-CM | POA: Diagnosis not present

## 2019-06-14 DIAGNOSIS — Z6825 Body mass index (BMI) 25.0-25.9, adult: Secondary | ICD-10-CM | POA: Diagnosis not present

## 2019-06-14 DIAGNOSIS — J069 Acute upper respiratory infection, unspecified: Secondary | ICD-10-CM | POA: Diagnosis not present

## 2019-07-04 DIAGNOSIS — J069 Acute upper respiratory infection, unspecified: Secondary | ICD-10-CM | POA: Diagnosis not present

## 2019-07-04 DIAGNOSIS — F17210 Nicotine dependence, cigarettes, uncomplicated: Secondary | ICD-10-CM | POA: Diagnosis not present

## 2019-07-04 DIAGNOSIS — Z6825 Body mass index (BMI) 25.0-25.9, adult: Secondary | ICD-10-CM | POA: Diagnosis not present

## 2019-07-04 DIAGNOSIS — J45909 Unspecified asthma, uncomplicated: Secondary | ICD-10-CM | POA: Diagnosis not present

## 2019-07-04 DIAGNOSIS — Z299 Encounter for prophylactic measures, unspecified: Secondary | ICD-10-CM | POA: Diagnosis not present

## 2019-07-11 DIAGNOSIS — Z79891 Long term (current) use of opiate analgesic: Secondary | ICD-10-CM | POA: Diagnosis not present

## 2019-07-11 DIAGNOSIS — G894 Chronic pain syndrome: Secondary | ICD-10-CM | POA: Diagnosis not present

## 2019-07-11 DIAGNOSIS — F419 Anxiety disorder, unspecified: Secondary | ICD-10-CM | POA: Diagnosis not present

## 2019-07-11 DIAGNOSIS — M545 Low back pain: Secondary | ICD-10-CM | POA: Diagnosis not present

## 2019-07-11 DIAGNOSIS — M461 Sacroiliitis, not elsewhere classified: Secondary | ICD-10-CM | POA: Diagnosis not present

## 2019-08-17 DIAGNOSIS — F329 Major depressive disorder, single episode, unspecified: Secondary | ICD-10-CM | POA: Diagnosis not present

## 2019-08-17 DIAGNOSIS — E7800 Pure hypercholesterolemia, unspecified: Secondary | ICD-10-CM | POA: Diagnosis not present

## 2019-09-10 ENCOUNTER — Ambulatory Visit (HOSPITAL_COMMUNITY): Payer: Medicare Other | Admitting: Psychiatry

## 2019-09-14 DIAGNOSIS — Z6825 Body mass index (BMI) 25.0-25.9, adult: Secondary | ICD-10-CM | POA: Diagnosis not present

## 2019-09-14 DIAGNOSIS — F17210 Nicotine dependence, cigarettes, uncomplicated: Secondary | ICD-10-CM | POA: Diagnosis not present

## 2019-09-14 DIAGNOSIS — J45909 Unspecified asthma, uncomplicated: Secondary | ICD-10-CM | POA: Diagnosis not present

## 2019-09-14 DIAGNOSIS — Z299 Encounter for prophylactic measures, unspecified: Secondary | ICD-10-CM | POA: Diagnosis not present

## 2019-09-14 DIAGNOSIS — E7800 Pure hypercholesterolemia, unspecified: Secondary | ICD-10-CM | POA: Diagnosis not present

## 2019-09-14 DIAGNOSIS — F419 Anxiety disorder, unspecified: Secondary | ICD-10-CM | POA: Diagnosis not present

## 2019-09-14 DIAGNOSIS — J069 Acute upper respiratory infection, unspecified: Secondary | ICD-10-CM | POA: Diagnosis not present

## 2019-09-20 ENCOUNTER — Other Ambulatory Visit: Payer: Self-pay

## 2019-09-20 ENCOUNTER — Encounter: Payer: Self-pay | Admitting: Psychiatry

## 2019-09-20 ENCOUNTER — Ambulatory Visit (INDEPENDENT_AMBULATORY_CARE_PROVIDER_SITE_OTHER): Payer: Medicare Other | Admitting: Psychiatry

## 2019-09-20 DIAGNOSIS — F419 Anxiety disorder, unspecified: Secondary | ICD-10-CM | POA: Diagnosis not present

## 2019-09-20 DIAGNOSIS — F3342 Major depressive disorder, recurrent, in full remission: Secondary | ICD-10-CM | POA: Insufficient documentation

## 2019-09-20 MED ORDER — SERTRALINE HCL 100 MG PO TABS: ORAL_TABLET | ORAL | 0 refills | Status: DC

## 2019-09-20 NOTE — Progress Notes (Signed)
[facility] MD OP Progress Note  I connected with  Paula Bailey on 09/20/19 by a video enabled telemedicine application and verified that I am speaking with the correct person using two identifiers.   I discussed the limitations of evaluation and management by telemedicine. The patient expressed understanding and agreed to proceed.    09/20/2019 8:51 AM Paula Bailey  MRN:  [id number]  Chief Complaint: " I am doing okay, but I think the dose is too much for me."  HPI: The patient was accompanied by her sister at the time of the appointment.  Patient informed that sertraline 200 mg dose has helped her mood however she feels it is too much for her.  She feels tired and sluggish because of that.  She feels she is sleeping a little bit too much.  Patient's sister acknowledged this.  Patient and her sister asked if the dose could be dropped back to 150 mg daily.  She is sleeping well at night.  She is able to go to sleep on time and sometimes wakes up in the middle of the night but is able to return back to sleep after that.  She reported she takes Xanax daily in the evening as it helps her relax and fall asleep.  She and her sister denied any acute issues or concerns at this time. Patient asked about Dr. Modesta Messing return date twice during the session and appeared to be somewhat concrete in her thought process.  Visit Diagnosis:    ICD-10-CM   1. MDD (major depressive disorder), recurrent, in full remission ([place])  F33.42   2. Anxiety  F41.9     Past Psychiatric History: depression, anxiety, suspected intellectual disability  Past Medical History:  Past Medical History:  Diagnosis Date  . Dyslipidemia    No past surgical history on file.  Family Psychiatric History: denied  Family History: No family history on file.  Social History:  Social History   Socioeconomic History  . Marital status: Divorced    Spouse name: Not on file  . Number of children: Not on file  . Years of education: Not on  file  . Highest education level: Not on file  Occupational History  . Not on file  Tobacco Use  . Smoking status: Current Every Day Smoker    Packs/day: 0.50    Years: 10.00    Pack years: 5.00  . Smokeless tobacco: Never Used  Substance and Sexual Activity  . Alcohol use: No  . Drug use: Not Currently  . Sexual activity: Not Currently  Other Topics Concern  . Not on file  Social History Narrative  . Not on file   Social Determinants of Health   Financial Resource Strain:   . Difficulty of Paying Living Expenses: Not on file  Food Insecurity:   . Worried About Charity fundraiser in the Last Year: Not on file  . Ran Out of Food in the Last Year: Not on file  Transportation Needs:   . Lack of Transportation (Medical): Not on file  . Lack of Transportation (Non-Medical): Not on file  Physical Activity:   . Days of Exercise per Week: Not on file  . Minutes of Exercise per Session: Not on file  Stress:   . Feeling of Stress : Not on file  Social Connections:   . Frequency of Communication with Friends and Family: Not on file  . Frequency of Social Gatherings with Friends and Family: Not on file  . Attends Religious  Services: Not on file  . Active Member of Clubs or Organizations: Not on file  . Attends Banker Meetings: Not on file  . Marital Status: Not on file    Allergies: No Known Allergies  Metabolic Disorder Labs: No results found for: HGBA1C, MPG No results found for: PROLACTIN No results found for: CHOL, TRIG, HDL, CHOLHDL, VLDL, LDLCALC No results found for: TSH  Therapeutic Level Labs: No results found for: LITHIUM No results found for: VALPROATE No components found for:  CBMZ  Current Medications: Current Outpatient Medications  Medication Sig Dispense Refill  . alendronate (FOSAMAX) 70 MG tablet Take 70 mg by mouth once a week.  6  . ALPRAZolam (XANAX) 0.25 MG tablet Take 0.25 mg by mouth every 12 (twelve) hours.  0  . fluticasone  (FLONASE) 50 MCG/ACT nasal spray Place 2 sprays into both nostrils daily.  4  . gabapentin (NEURONTIN) 300 MG capsule TAKE ONE CAPSULE THREE TIMES DAILY  2  . oxyCODONE-acetaminophen (PERCOCET) 7.5-325 MG tablet Take 1 tablet by mouth every 8 (eight) hours as needed.  0  . sertraline (ZOLOFT) 100 MG tablet Take 2 tablets (200 mg total) by mouth daily. 180 tablet 1  . VENTOLIN HFA 108 (90 Base) MCG/ACT inhaler INHALE 1-2 PUFFS FOUR TIMES DAILY AS NEEDED  12   No current facility-administered medications for this visit.     Musculoskeletal: Strength & Muscle Tone: unable to assess due to telemed visit Gait & Station: unable to assess due to telemed visit Patient leans: unable to assess due to telemed visit  Psychiatric Specialty Exam: Review of Systems  There were no vitals taken for this visit.There is no height or weight on file to calculate BMI.  General Appearance: Fairly Groomed  Eye Contact:  Good  Speech:  Clear and Coherent and Slow  Volume:  Normal  Mood:  Euthymic  Affect:  Restricted  Thought Process: Somewhat concrete, Descriptions of Associations: Intact  Orientation:  Full (Time, Place, and Person)  Thought Content: Logical   Suicidal Thoughts:  No  Homicidal Thoughts:  No  Memory:  Immediate;   Good Recent;   Good  Judgement:  Fair  Insight:  Fair  Psychomotor Activity:  Normal  Concentration:  Concentration: Good and Attention Span: Fair  Recall:  [employer] of Knowledge: Fair  Language: Good  Akathisia:  Negative  Handed:  Right  AIMS (if indicated): not done  Assets:  Communication Skills Desire for Improvement Financial Resources/Insurance Housing  ADL's:  Intact  Cognition: WNL  Sleep:  Good    Assessment and Plan: Patient and her sister feel that the current dose of sertraline may be too strong for her and requested to reduce the dose.  The dose is being dropped to 150 mg daily in view of sluggishness reported by the patient.  1. MDD (major  depressive disorder), recurrent, in full remission (HCC)  - sertraline (ZOLOFT) 100 MG tablet; Take one and a half tablets (150 mg) daily  Dispense: 135 tablet; Refill: 0  2. Anxiety  - sertraline (ZOLOFT) 100 MG tablet; Take one and a half tablets (150 mg) daily  Dispense: 135 tablet; Refill: 0   F/up in 2 months with Dr Vanetta Shawl.  Zena Amos, MD 09/20/2019, 8:51 AM

## 2019-10-09 DIAGNOSIS — F419 Anxiety disorder, unspecified: Secondary | ICD-10-CM | POA: Diagnosis not present

## 2019-10-09 DIAGNOSIS — Z79891 Long term (current) use of opiate analgesic: Secondary | ICD-10-CM | POA: Diagnosis not present

## 2019-10-09 DIAGNOSIS — M545 Low back pain: Secondary | ICD-10-CM | POA: Diagnosis not present

## 2019-10-09 DIAGNOSIS — M461 Sacroiliitis, not elsewhere classified: Secondary | ICD-10-CM | POA: Diagnosis not present

## 2019-10-09 DIAGNOSIS — G894 Chronic pain syndrome: Secondary | ICD-10-CM | POA: Diagnosis not present

## 2019-10-11 ENCOUNTER — Inpatient Hospital Stay (HOSPITAL_COMMUNITY): Payer: Medicare Other

## 2019-10-11 ENCOUNTER — Inpatient Hospital Stay (HOSPITAL_COMMUNITY)
Admission: EM | Admit: 2019-10-11 | Discharge: 2019-10-19 | DRG: 871 | Disposition: A | Discharge status: Home Health | Payer: Medicare Other | Attending: Emergency Medicine | Admitting: Emergency Medicine

## 2019-10-11 ENCOUNTER — Emergency Department (HOSPITAL_COMMUNITY): Payer: Medicare Other

## 2019-10-11 DIAGNOSIS — R6521 Severe sepsis with septic shock: Secondary | ICD-10-CM | POA: Diagnosis not present

## 2019-10-11 DIAGNOSIS — Z7983 Long term (current) use of bisphosphonates: Secondary | ICD-10-CM

## 2019-10-11 DIAGNOSIS — F17200 Nicotine dependence, unspecified, uncomplicated: Secondary | ICD-10-CM | POA: Diagnosis not present

## 2019-10-11 DIAGNOSIS — F419 Anxiety disorder, unspecified: Secondary | ICD-10-CM | POA: Diagnosis present

## 2019-10-11 DIAGNOSIS — J9601 Acute respiratory failure with hypoxia: Secondary | ICD-10-CM | POA: Diagnosis present

## 2019-10-11 DIAGNOSIS — F40240 Claustrophobia: Secondary | ICD-10-CM | POA: Diagnosis present

## 2019-10-11 DIAGNOSIS — A419 Sepsis, unspecified organism: Secondary | ICD-10-CM | POA: Diagnosis present

## 2019-10-11 DIAGNOSIS — D72829 Elevated white blood cell count, unspecified: Secondary | ICD-10-CM

## 2019-10-11 DIAGNOSIS — R7989 Other specified abnormal findings of blood chemistry: Secondary | ICD-10-CM | POA: Diagnosis not present

## 2019-10-11 DIAGNOSIS — E871 Hypo-osmolality and hyponatremia: Secondary | ICD-10-CM | POA: Diagnosis present

## 2019-10-11 DIAGNOSIS — F17210 Nicotine dependence, cigarettes, uncomplicated: Secondary | ICD-10-CM | POA: Diagnosis present

## 2019-10-11 DIAGNOSIS — Z79899 Other long term (current) drug therapy: Secondary | ICD-10-CM

## 2019-10-11 DIAGNOSIS — J181 Lobar pneumonia, unspecified organism: Secondary | ICD-10-CM

## 2019-10-11 DIAGNOSIS — R6510 Systemic inflammatory response syndrome (SIRS) of non-infectious origin without acute organ dysfunction: Secondary | ICD-10-CM

## 2019-10-11 DIAGNOSIS — R531 Weakness: Secondary | ICD-10-CM | POA: Diagnosis not present

## 2019-10-11 DIAGNOSIS — R0689 Other abnormalities of breathing: Secondary | ICD-10-CM | POA: Diagnosis not present

## 2019-10-11 DIAGNOSIS — F32A Depression, unspecified: Secondary | ICD-10-CM | POA: Diagnosis present

## 2019-10-11 DIAGNOSIS — Z781 Physical restraint status: Secondary | ICD-10-CM | POA: Diagnosis not present

## 2019-10-11 DIAGNOSIS — Z452 Encounter for adjustment and management of vascular access device: Secondary | ICD-10-CM | POA: Diagnosis not present

## 2019-10-11 DIAGNOSIS — I5031 Acute diastolic (congestive) heart failure: Secondary | ICD-10-CM | POA: Diagnosis not present

## 2019-10-11 DIAGNOSIS — Z720 Tobacco use: Secondary | ICD-10-CM | POA: Diagnosis present

## 2019-10-11 DIAGNOSIS — J441 Chronic obstructive pulmonary disease with (acute) exacerbation: Secondary | ICD-10-CM

## 2019-10-11 DIAGNOSIS — R000 Tachycardia, unspecified: Secondary | ICD-10-CM | POA: Diagnosis not present

## 2019-10-11 DIAGNOSIS — F3342 Major depressive disorder, recurrent, in full remission: Secondary | ICD-10-CM | POA: Diagnosis present

## 2019-10-11 DIAGNOSIS — J410 Simple chronic bronchitis: Secondary | ICD-10-CM | POA: Diagnosis not present

## 2019-10-11 DIAGNOSIS — F329 Major depressive disorder, single episode, unspecified: Secondary | ICD-10-CM | POA: Diagnosis present

## 2019-10-11 DIAGNOSIS — Z79891 Long term (current) use of opiate analgesic: Secondary | ICD-10-CM | POA: Diagnosis not present

## 2019-10-11 DIAGNOSIS — Z20822 Contact with and (suspected) exposure to covid-19: Secondary | ICD-10-CM | POA: Diagnosis present

## 2019-10-11 DIAGNOSIS — J80 Acute respiratory distress syndrome: Secondary | ICD-10-CM | POA: Diagnosis not present

## 2019-10-11 DIAGNOSIS — E785 Hyperlipidemia, unspecified: Secondary | ICD-10-CM | POA: Diagnosis present

## 2019-10-11 DIAGNOSIS — R579 Shock, unspecified: Secondary | ICD-10-CM | POA: Diagnosis not present

## 2019-10-11 DIAGNOSIS — J189 Pneumonia, unspecified organism: Secondary | ICD-10-CM

## 2019-10-11 DIAGNOSIS — J440 Chronic obstructive pulmonary disease with acute lower respiratory infection: Secondary | ICD-10-CM | POA: Diagnosis present

## 2019-10-11 DIAGNOSIS — G92 Toxic encephalopathy: Secondary | ICD-10-CM | POA: Diagnosis not present

## 2019-10-11 DIAGNOSIS — R55 Syncope and collapse: Secondary | ICD-10-CM | POA: Diagnosis not present

## 2019-10-11 DIAGNOSIS — G894 Chronic pain syndrome: Secondary | ICD-10-CM

## 2019-10-11 DIAGNOSIS — R918 Other nonspecific abnormal finding of lung field: Secondary | ICD-10-CM | POA: Diagnosis not present

## 2019-10-11 DIAGNOSIS — R0902 Hypoxemia: Secondary | ICD-10-CM | POA: Diagnosis not present

## 2019-10-11 DIAGNOSIS — R0602 Shortness of breath: Secondary | ICD-10-CM | POA: Diagnosis not present

## 2019-10-11 DIAGNOSIS — J9600 Acute respiratory failure, unspecified whether with hypoxia or hypercapnia: Secondary | ICD-10-CM | POA: Diagnosis not present

## 2019-10-11 DIAGNOSIS — Z4682 Encounter for fitting and adjustment of non-vascular catheter: Secondary | ICD-10-CM | POA: Diagnosis not present

## 2019-10-11 LAB — PROTIME-INR
INR: 1.2 (ref 0.8–1.2)
Prothrombin Time: 14.7 seconds (ref 11.4–15.2)

## 2019-10-11 LAB — D-DIMER, QUANTITATIVE: D-Dimer, Quant: 1.7 ug/mL-FEU — ABNORMAL HIGH (ref 0.00–0.50)

## 2019-10-11 LAB — CBC WITH DIFFERENTIAL/PLATELET
Abs Immature Granulocytes: 0.09 10*3/uL — ABNORMAL HIGH (ref 0.00–0.07)
Basophils Absolute: 0 10*3/uL (ref 0.0–0.1)
Basophils Relative: 0 %
Eosinophils Absolute: 0 10*3/uL (ref 0.0–0.5)
Eosinophils Relative: 0 %
HCT: 42.6 % (ref 36.0–46.0)
Hemoglobin: 13.7 g/dL (ref 12.0–15.0)
Immature Granulocytes: 1 %
Lymphocytes Relative: 12 %
Lymphs Abs: 2 10*3/uL (ref 0.7–4.0)
MCH: 30.9 pg (ref 26.0–34.0)
MCHC: 32.2 g/dL (ref 30.0–36.0)
MCV: 96.2 fL (ref 80.0–100.0)
Monocytes Absolute: 0.3 10*3/uL (ref 0.1–1.0)
Monocytes Relative: 2 %
Neutro Abs: 13.3 10*3/uL — ABNORMAL HIGH (ref 1.7–7.7)
Neutrophils Relative %: 85 %
Platelets: 152 10*3/uL (ref 150–400)
RBC: 4.43 MIL/uL (ref 3.87–5.11)
RDW: 13.6 % (ref 11.5–15.5)
WBC: 15.7 10*3/uL — ABNORMAL HIGH (ref 4.0–10.5)
nRBC: 0 % (ref 0.0–0.2)

## 2019-10-11 LAB — COMPREHENSIVE METABOLIC PANEL
ALT: 28 U/L (ref 0–44)
AST: 38 U/L (ref 15–41)
Albumin: 3.6 g/dL (ref 3.5–5.0)
Alkaline Phosphatase: 60 U/L (ref 38–126)
Anion gap: 9 (ref 5–15)
BUN: 17 mg/dL (ref 6–20)
CO2: 27 mmol/L (ref 22–32)
Calcium: 8.8 mg/dL — ABNORMAL LOW (ref 8.9–10.3)
Chloride: 97 mmol/L — ABNORMAL LOW (ref 98–111)
Creatinine, Ser: 0.94 mg/dL (ref 0.44–1.00)
GFR calc Af Amer: >60 mL/min (ref >60)
GFR calc non Af Amer: >60 mL/min (ref >60)
Glucose, Bld: 111 mg/dL — ABNORMAL HIGH (ref 70–99)
Potassium: 4.5 mmol/L (ref 3.5–5.1)
Sodium: 133 mmol/L — ABNORMAL LOW (ref 135–145)
Total Bilirubin: 0.8 mg/dL (ref 0.3–1.2)
Total Protein: 7.3 g/dL (ref 6.5–8.1)

## 2019-10-11 LAB — RESPIRATORY PANEL BY RT PCR (FLU A&B, COVID)
Influenza A by PCR: NEGATIVE
Influenza A by PCR: NEGATIVE
Influenza B by PCR: NEGATIVE
Influenza B by PCR: NEGATIVE
SARS Coronavirus 2 by RT PCR: NEGATIVE
SARS Coronavirus 2 by RT PCR: NEGATIVE

## 2019-10-11 LAB — PROCALCITONIN: Procalcitonin: 0.29 ng/mL

## 2019-10-11 LAB — POC URINE PREG, ED: Preg Test, Ur: NEGATIVE

## 2019-10-11 LAB — FERRITIN: Ferritin: 167 ng/mL (ref 11–307)

## 2019-10-11 LAB — LACTIC ACID, PLASMA: Lactic Acid, Venous: 1.6 mmol/L (ref 0.5–1.9)

## 2019-10-11 LAB — BRAIN NATRIURETIC PEPTIDE: B Natriuretic Peptide: 141 pg/mL — ABNORMAL HIGH (ref 0.0–100.0)

## 2019-10-11 LAB — CK: Total CK: 366 U/L — ABNORMAL HIGH (ref 38–234)

## 2019-10-11 LAB — APTT: aPTT: 28 seconds (ref 24–36)

## 2019-10-11 LAB — LACTATE DEHYDROGENASE: LDH: 537 U/L — ABNORMAL HIGH (ref 98–192)

## 2019-10-11 LAB — C-REACTIVE PROTEIN: CRP: 29.8 mg/dL — ABNORMAL HIGH (ref <1.0)

## 2019-10-11 MED ORDER — NICOTINE 14 MG/24HR TD PT24
14.0000 mg | MEDICATED_PATCH | Freq: Every day | TRANSDERMAL | Status: DC
  Administered 2019-10-11 – 2019-10-19 (×9): 14 mg via TRANSDERMAL
  Filled 2019-10-11 (×9): qty 1

## 2019-10-11 MED ORDER — LIDOCAINE 5 % EX PTCH
1.0000 | MEDICATED_PATCH | CUTANEOUS | Status: DC
  Administered 2019-10-11 – 2019-10-17 (×4): 1 via TRANSDERMAL
  Filled 2019-10-11 (×11): qty 1

## 2019-10-11 MED ORDER — HYDROCODONE-ACETAMINOPHEN 5-325 MG PO TABS
1.0000 | ORAL_TABLET | Freq: Once | ORAL | Status: AC
  Administered 2019-10-11: 1 via ORAL
  Filled 2019-10-11: qty 1

## 2019-10-11 MED ORDER — SODIUM CHLORIDE 0.9 % IV BOLUS
1000.0000 mL | Freq: Once | INTRAVENOUS | Status: AC
  Administered 2019-10-11: 19:00:00 1000 mL via INTRAVENOUS

## 2019-10-11 MED ORDER — AMITRIPTYLINE HCL 10 MG PO TABS
20.0000 mg | ORAL_TABLET | Freq: Every day | ORAL | Status: DC
  Filled 2019-10-11: qty 2

## 2019-10-11 MED ORDER — ACETAMINOPHEN 650 MG RE SUPP: 650.0000 mg | Freq: Four times a day (QID) | RECTAL | Status: DC | PRN

## 2019-10-11 MED ORDER — SODIUM CHLORIDE 0.9 % IV SOLN: 1.0000 g | INTRAVENOUS | Status: DC

## 2019-10-11 MED ORDER — SODIUM CHLORIDE 0.9 % IV BOLUS
500.0000 mL | Freq: Once | INTRAVENOUS | Status: AC
  Administered 2019-10-11: 23:00:00 500 mL via INTRAVENOUS

## 2019-10-11 MED ORDER — SODIUM CHLORIDE 0.9 % IV SOLN
500.0000 mg | INTRAVENOUS | Status: DC
  Administered 2019-10-12 – 2019-10-13 (×2): 500 mg via INTRAVENOUS
  Filled 2019-10-11 (×3): qty 500

## 2019-10-11 MED ORDER — SODIUM CHLORIDE 0.9 % IV SOLN: Freq: Once | INTRAVENOUS | Status: AC

## 2019-10-11 MED ORDER — IOHEXOL 350 MG/ML SOLN
100.0000 mL | Freq: Once | INTRAVENOUS | Status: AC | PRN
  Administered 2019-10-11: 100 mL via INTRAVENOUS

## 2019-10-11 MED ORDER — SODIUM CHLORIDE 0.9 % IV SOLN
1.0000 g | Freq: Once | INTRAVENOUS | Status: AC
  Administered 2019-10-11: 19:00:00 1 g via INTRAVENOUS
  Filled 2019-10-11: qty 10

## 2019-10-11 MED ORDER — SERTRALINE HCL 50 MG PO TABS
150.0000 mg | ORAL_TABLET | Freq: Every day | ORAL | Status: DC
  Administered 2019-10-12: 09:00:00 150 mg via ORAL
  Filled 2019-10-11: qty 3

## 2019-10-11 MED ORDER — ACETAMINOPHEN 325 MG PO TABS
650.0000 mg | ORAL_TABLET | Freq: Four times a day (QID) | ORAL | Status: DC | PRN
  Administered 2019-10-11 – 2019-10-18 (×3): 650 mg via ORAL
  Filled 2019-10-11 (×3): qty 2

## 2019-10-11 MED ORDER — SODIUM CHLORIDE 0.9 % IV SOLN
500.0000 mg | Freq: Once | INTRAVENOUS | Status: AC
  Administered 2019-10-11: 19:00:00 500 mg via INTRAVENOUS
  Filled 2019-10-11: qty 500

## 2019-10-11 MED ORDER — ENOXAPARIN SODIUM 40 MG/0.4ML ~~LOC~~ SOLN
40.0000 mg | SUBCUTANEOUS | Status: DC
  Administered 2019-10-12 – 2019-10-18 (×7): 40 mg via SUBCUTANEOUS
  Filled 2019-10-11 (×7): qty 0.4

## 2019-10-11 MED ORDER — DM-GUAIFENESIN ER 30-600 MG PO TB12
1.0000 | ORAL_TABLET | Freq: Two times a day (BID) | ORAL | Status: DC
  Administered 2019-10-11: 22:00:00 1 via ORAL
  Filled 2019-10-11: qty 1

## 2019-10-11 MED ORDER — SIMVASTATIN 20 MG PO TABS: 10.0000 mg | ORAL_TABLET | Freq: Every day | ORAL | Status: DC

## 2019-10-11 MED ORDER — ALBUTEROL SULFATE HFA 108 (90 BASE) MCG/ACT IN AERS: 1.0000 | INHALATION_SPRAY | RESPIRATORY_TRACT | Status: DC | PRN

## 2019-10-11 MED ORDER — ALBUTEROL SULFATE HFA 108 (90 BASE) MCG/ACT IN AERS
6.0000 | INHALATION_SPRAY | Freq: Once | RESPIRATORY_TRACT | Status: AC
  Administered 2019-10-11: 17:00:00 6 via RESPIRATORY_TRACT
  Filled 2019-10-11: qty 6.7

## 2019-10-11 NOTE — ED Notes (Signed)
MD bedside

## 2019-10-11 NOTE — H&P (Signed)
History and Physical  Dierdre Mccalip OZD:[id number] DOB: 02/21/1961 DOA: 10/11/2019  Referring physician: Bethann Berkshire MD PCP: Ignatius Specking, MD  Patient coming from: Home  Chief Complaint: Generalized weakness and SOB  HPI: Paula Bailey is a 59 y.o. female with medical history significant for hyperlipidemia, depression, COPD, anxiety and tobacco abuse who presents to the emergency department due to 3-day onset of generalized weakness, productive cough (clear sputum) and shortness of breath.  Patient sustained a fall today due to weakness but denies hitting her head, EMS was activated and arrival of EMS patient was noted to be hypoxic with an O2 sat of 86% on room air, supplemental oxygen was provided and patient was taken to ED for further evaluation and management.  She complains of reproducible chest pain which worsens with deep inspiration and cough, but she denies change in taste or smell, patient denies diarrhea, fever, chills, numbness or tingling.  ED Course: In the emergency department, she was tachycardic and tachypneic, initial BP was 94/41 but this has since improved to 101/65.  Temperature 99.31F, she was provided with supplemental oxygen at 4 LPM with O2 sat at 94-97%.  Work-up in the ED ED showed leukocytosis, hyponatremia, BNP 141, SARS-CoV 2 RNA x2 was negative, influenza A and B negative.  Chest x-ray showed widespread bilateral groundglass airspace disease consistent with multifocal pneumonia with findings being suspicious for COVID-19 pneumonia.  She was treated with IV ceftriaxone and azithromycin.  Hospitalist was asked to admit patient for further evaluation and management.  Review of Systems: Constitutional: Negative for chills and fever.  HENT: Negative for ear pain and sore throat.   Eyes: Negative for pain and visual disturbance.  Respiratory: Positive for cough and shortness of breath.   Cardiovascular: Positive for  reproducible chest pain and palpitations.    Gastrointestinal: Negative for abdominal pain and vomiting.  Endocrine: Negative for polyphagia and polyuria.  Genitourinary: Negative for decreased urine volume, dysuria, enuresis Musculoskeletal: Negative for arthralgias and back pain.  Skin: Negative for color change and rash.  Allergic/Immunologic: Negative for immunocompromised state.  Neurological: Positive for weakness.  Negative for tremors, syncope, speech difficulty, light-headedness and headaches.  Hematological: Does not bruise/bleed easily.  All other systems reviewed and are negative  Past Medical History:  Diagnosis Date  . Dyslipidemia    No past surgical history on file.  Social History:  reports that she has been smoking. She has a 5.00 pack-year smoking history. She has never used smokeless tobacco. She reports previous drug use. She reports that she does not drink alcohol.   No Known Allergies  No family history on file.    Prior to Admission medications   Medication Sig Start Date End Date Taking? Authorizing Provider  alendronate (FOSAMAX) 70 MG tablet Take 70 mg by mouth once a week. 08/26/17  Yes [provider]  ALPRAZolam Prudy Feeler) 0.5 MG tablet Take 0.25 mg by mouth at bedtime.  07/11/17  Yes [provider]  amitriptyline (ELAVIL) 10 MG tablet Take 20 mg by mouth at bedtime. 09/28/19  Yes [provider]  gabapentin (NEURONTIN) 300 MG capsule Take 600 mg by mouth 3 (three) times daily.  08/26/17  Yes [provider]  oxyCODONE-acetaminophen (PERCOCET) 7.5-325 MG tablet Take 1 tablet by mouth every 8 (eight) hours as needed for moderate pain or severe pain.  09/08/17  Yes [provider]  sertraline (ZOLOFT) 100 MG tablet Take one and a half tablets (150 mg) daily Patient taking differently: Take 150 mg  by mouth daily.  09/20/19  Yes [place] Crane, MD  simvastatin (ZOCOR) 10 MG tablet Take 10 mg by mouth at bedtime. 10/01/19  Yes [provider]  VENTOLIN  HFA 108 (90 Base) MCG/ACT inhaler Inhale 1-2 puffs into the lungs every 4 (four) hours as needed for wheezing or shortness of breath.  08/26/17  Yes [provider]    Physical Exam: BP 101/65   Pulse (!) 107   Temp 99.2 F (37.3 C) (Oral)   Resp (!) 28   SpO2 91%   . General: 59 y.o. year-old female well developed well nourished in no acute distress.  Alert and oriented x3. Marland Kitchen HEENT: Normocephalic, atraumatic . Neck: Supple, trachea medial . Cardiovascular: Regular rate and rhythm with no rubs or gallops.  No thyromegaly or JVD noted.  No lower extremity edema. 2/4 pulses in all 4 extremities. Marland Kitchen Respiratory: Decreased breath sounds bilaterally.  No use of accessory muscle  . Abdomen: Soft nontender nondistended with normal bowel sounds x4 quadrants. . Muskuloskeletal: No cyanosis, clubbing or edema noted bilaterally . Neuro: CN II-XII intact, strength, sensation, reflexes . Skin: No ulcerative lesions noted or rashes . Psychiatry: Judgement and insight appear normal. Mood is appropriate for condition and setting          Labs on Admission:  Basic Metabolic Panel: Recent Labs  Lab 10/11/19 1708  NA 133*  K 4.5  CL 97*  CO2 27  GLUCOSE 111*  BUN 17  CREATININE 0.94  CALCIUM 8.8*   Liver Function Tests: Recent Labs  Lab 10/11/19 1708  AST 38  ALT 28  ALKPHOS 60  BILITOT 0.8  PROT 7.3  ALBUMIN 3.6   No results for input(s): LIPASE, AMYLASE in the last 168 hours. No results for input(s): AMMONIA in the last 168 hours. CBC: Recent Labs  Lab 10/11/19 1708  WBC 15.7*  NEUTROABS 13.3*  HGB 13.7  HCT 42.6  MCV 96.2  PLT 152   Cardiac Enzymes: No results for input(s): CKTOTAL, CKMB, CKMBINDEX, TROPONINI in the last 168 hours.  BNP (last 3 results) Recent Labs    10/11/19 1708  BNP 141.0*    ProBNP (last 3 results) No results for input(s): PROBNP in the last 8760 hours.  CBG: No results for input(s): GLUCAP in the last 168  hours.  Radiological Exams on Admission: DG Chest Portable 1 View  Result Date: 10/11/2019 CLINICAL DATA:  Weakness for several days, short of breath, pain with inspiration EXAM: PORTABLE CHEST 1 VIEW COMPARISON:  11/01/2016 FINDINGS: Single frontal view of the chest demonstrates an unremarkable cardiac silhouette. There is widespread interstitial and ground-glass opacities. No effusion or pneumothorax. No acute bony abnormalities. IMPRESSION: 1. Widespread bilateral ground-glass airspace disease consistent with multifocal pneumonia. Findings are suspicious for COVID-19 pneumonia. Electronically Signed   By: Randa Ngo M.D.   On: 10/11/2019 18:06    EKG: I independently viewed the EKG done and my findings are as followed: Sinus tachycardia at a rate of 104bpm.  Assessment/Plan Present on Admission: . Anxiety  Principal Problem:   CAP (community acquired pneumonia) Active Problems:   Anxiety   Leukocytosis   Hyponatremia   Elevated brain natriuretic peptide (BNP) level   Depression   Tobacco abuse   Acute respiratory failure with hypoxia (HCC)   Acute respiratory failure with hypoxia possibly secondary to community acquired pneumonia  SIRS with suspicion for sepsis secondary to above Patient was tachycardic, tachypneic, has leukocytosis (meets SIRS criteria) Chest x-ray showed widespread bilateral  groundglass airspace disease consistent with multifocal pneumonia with findings being suspicious for COVID-19 pneumonia.   SIRS-CoV-2 RNA x 2 was negative, but considering patient's symptoms, inflammatory markers checked showed LVEF around 37, CRP 29.8, CK 366, ferritin 167. Patient will still be placed on PUI COVID-19 test to be repeated 24 hours from time of admission. procalcitonin 0.29 PORT/PSI of  67points  indicating 0.-0.9% mortality Continue Mucinex, she was started on IV ceftriaxone and azithromycin, we shall continue with same at this time with plan to de-escalate based on  blood culture and sputum culture Continue  incentive spirometry and flutter valve  Continue home ventolin as needed Blood culture and sputum culture pending Continue supplemental oxygen via NRB to maintain O2 sats > 92% with plan to wean patient off supplemental oxygen as tolerated (of note, patient does not use parental oxygen at baseline). Considering patient being tachycardic and tachypneic with hypoxia, D-dimer checked was elevated and CT angiography of chest with contrast showed no pulmonary embolus or acute aortic syndrome, but did show small bilateral pleural effusions and diffuse groundglass opacities throughout both lungs which could indicate infection or pulmonary edema.  Of note, despite IV hydration per sepsis protocol, patient continues to be hypotensive, central line was then placed in the ED and patient was started on IV Levophed.  She will be admitted to ICU.  Leukocytosis possibly secondary to above Continue treatment as per above  Hyponatremia possibly reactive Na 133, IV hydration provided Continue to monitor sodium level with morning labs  Elevated BNP R/O CHF BNP 141, patient denies any history of heart disease, patient without JVD and she denies recent increase in weight. Continue total input/output, daily weights and fluid restriction Continue Cardiac diet  EKG shows sinus tachycardia at a rate of 104bpm Echocardiogram in the morning   Anxiety Home Xanax will be held at this time due to current respiratory depression  Depression Continue home Zoloft and Elavil  Tobacco abuse Patient counseled on tobacco abuse cessation and she acknowledged understanding discussion Continue nicotine patch  DVT prophylaxis:   Code Status: Full Code  Family Communication: None at bedside  Disposition Plan: Home once clinically improved, possibly within the next 48 to 72 hours  Consults called: None  Admission status: Inpatient admission  At the time of my evaluation  and in light of the patient's chronic comorbidities, presenting symptoms and exam, available laboratory studies and diagnostic studies, and available home services, hospital care was judged medically necessary, and the patient's discharge was expected in greater than 2 midnights.    Frankey Shown MD [facility]  If 7PM-7AM, please contact night-coverage [email or web address]  10/11/2019, 8:34 PM

## 2019-10-11 NOTE — ED Provider Notes (Signed)
[facility] Provider Note   CSN: [id number] Arrival date & time: 10/11/19  1620     History Chief Complaint  Patient presents with  . Weakness    Paula Bailey is a 59 y.o. female.  Patient complains of shortness of breath and weakness for 3 days  The history is provided by the patient. No language interpreter was used.  Weakness Severity:  Moderate Onset quality:  Sudden Timing:  Constant Progression:  Worsening Chronicity:  New Relieved by:  Nothing Worsened by:  Nothing Ineffective treatments:  None tried Associated symptoms: no abdominal pain, no chest pain, no cough, no diarrhea, no frequency, no headaches and no seizures        Past Medical History:  Diagnosis Date  . Dyslipidemia     Patient Active Problem List   Diagnosis Date Noted  . MDD (major depressive disorder), recurrent, in full remission ([place]) 09/20/2019  . Anxiety 09/20/2019  . Current moderate episode of major depressive disorder without prior episode ([place]) 09/27/2017    No past surgical history on file.   OB History   No obstetric history on file.     No family history on file.  Social History   Tobacco Use  . Smoking status: Current Every Day Smoker    Packs/day: 0.50    Years: 10.00    Pack years: 5.00  . Smokeless tobacco: Never Used  Substance Use Topics  . Alcohol use: No  . Drug use: Not Currently    Home Medications Prior to Admission medications   Medication Sig Start Date End Date Taking? Authorizing Provider  alendronate (FOSAMAX) 70 MG tablet Take 70 mg by mouth once a week. 08/26/17   [provider]  ALPRAZolam Duanne Moron) 0.25 MG tablet Take 0.25 mg by mouth every 12 (twelve) hours. 07/11/17   [provider]  fluticasone (FLONASE) 50 MCG/ACT nasal spray Place 2 sprays into both nostrils daily. 08/26/17   [provider]  gabapentin (NEURONTIN) 300 MG capsule TAKE ONE CAPSULE THREE TIMES DAILY 08/26/17   [provider]  oxyCODONE-acetaminophen (PERCOCET) 7.5-325 MG tablet Take 1 tablet by mouth every 8 (eight) hours as needed. 09/08/17   [provider]  sertraline (ZOLOFT) 100 MG tablet Take one and a half tablets (150 mg) daily 09/20/19   [place] Crane, MD  VENTOLIN HFA [phone] Base) MCG/ACT inhaler INHALE 1-2 PUFFS FOUR TIMES DAILY AS NEEDED 08/26/17   [provider]    Allergies    Patient has no known allergies.  Review of Systems   Review of Systems  Constitutional: Negative for appetite change and fatigue.  HENT: Negative for congestion, ear discharge and sinus pressure.   Eyes: Negative for discharge.  Respiratory: Negative for cough.   Cardiovascular: Negative for chest pain.  Gastrointestinal: Negative for abdominal pain and diarrhea.  Genitourinary: Negative for frequency and hematuria.  Musculoskeletal: Negative for back pain.  Skin: Negative for rash.  Neurological: Positive for weakness. Negative for seizures and headaches.  Psychiatric/Behavioral: Negative for hallucinations.    Physical Exam Updated Vital Signs BP 111/68   Pulse (!) 103   Temp 99.2 F (37.3 C) (Oral)   Resp 15   SpO2 95%   Physical Exam Vitals and nursing note reviewed.  Constitutional:      Appearance: She is well-developed.  HENT:     Head: Normocephalic.     Nose: Nose normal.     Mouth/Throat:     Mouth: Mucous membranes are moist.  Eyes:     General: No scleral icterus.    Conjunctiva/sclera: Conjunctivae normal.  Neck:     Thyroid: No thyromegaly.  Cardiovascular:     Rate and Rhythm: Normal rate and regular rhythm.     Heart sounds: No murmur. No friction rub. No gallop.   Pulmonary:     Breath sounds: No stridor. Wheezing present. No rales.  Chest:     Chest wall: No tenderness.  Abdominal:     General: There is no distension.     Tenderness: There is no abdominal tenderness. There is no rebound.  Musculoskeletal:        General: Normal range of  motion.     Cervical back: Neck supple.  Lymphadenopathy:     Cervical: No cervical adenopathy.  Skin:    Findings: No erythema or rash.  Neurological:     Mental Status: She is oriented to person, place, and time.     Motor: No abnormal muscle tone.     Coordination: Coordination normal.  Psychiatric:        Behavior: Behavior normal.     ED Results / Procedures / Treatments   Labs (all labs ordered are listed, but only abnormal results are displayed) Labs Reviewed  CBC WITH DIFFERENTIAL/PLATELET - Abnormal; Notable for the following components:      Result Value   WBC 15.7 (*)    Neutro Abs 13.3 (*)    Abs Immature Granulocytes 0.09 (*)    All other components within normal limits  COMPREHENSIVE METABOLIC PANEL - Abnormal; Notable for the following components:   Sodium 133 (*)    Chloride 97 (*)    Glucose, Bld 111 (*)    Calcium 8.8 (*)    All other components within normal limits  BRAIN NATRIURETIC PEPTIDE - Abnormal; Notable for the following components:   B Natriuretic Peptide 141.0 (*)    All other components within normal limits  RESPIRATORY PANEL BY RT PCR (FLU A&B, COVID)  CULTURE, BLOOD (ROUTINE X 2)  CULTURE, BLOOD (ROUTINE X 2)  URINE CULTURE  RESPIRATORY PANEL BY RT PCR (FLU A&B, COVID)  LACTIC ACID, PLASMA  APTT  PROTIME-INR  URINALYSIS, ROUTINE W REFLEX MICROSCOPIC  POC URINE PREG, ED    EKG None  Radiology DG Chest Portable 1 View  Result Date: 10/11/2019 CLINICAL DATA:  Weakness for several days, short of breath, pain with inspiration EXAM: PORTABLE CHEST 1 VIEW COMPARISON:  11/01/2016 FINDINGS: Single frontal view of the chest demonstrates an unremarkable cardiac silhouette. There is widespread interstitial and ground-glass opacities. No effusion or pneumothorax. No acute bony abnormalities. IMPRESSION: 1. Widespread bilateral ground-glass airspace disease consistent with multifocal pneumonia. Findings are suspicious for COVID-19 pneumonia.  Electronically Signed   By: Sharlet Salina M.D.   On: 10/11/2019 18:06    Procedures Procedures (including critical care time)  Medications Ordered in ED Medications  azithromycin (ZITHROMAX) 500 mg in sodium chloride 0.9 % 250 mL IVPB (500 mg Intravenous New Bag/Given 10/11/19 1842)  albuterol (VENTOLIN HFA) 108 (90 Base) MCG/ACT inhaler 6 puff (6 puffs Inhalation Given 10/11/19 1701)  0.9 %  sodium chloride infusion ( Intravenous New Bag/Given 10/11/19 1706)  sodium chloride 0.9 % bolus 1,000 mL (0 mLs Intravenous Stopped 10/11/19 1909)  cefTRIAXone (ROCEPHIN) 1 g in sodium chloride 0.9 % 100 mL IVPB (1 g Intravenous New Bag/Given 10/11/19 1840)  HYDROcodone-acetaminophen (NORCO/VICODIN) 5-325 MG per tablet 1 tablet (1 tablet Oral Given 10/11/19 1906)    ED Course  I have reviewed the triage vital signs and the nursing notes.  Pertinent labs & imaging results that were available during my care of the patient were reviewed by me and considered in my medical decision making (see chart for details).    MDM Rules/Calculators/A&P                      CRITICAL CARE Performed by: Bethann Berkshire Total critical care time: 35 minutes Critical care time was exclusive of separately billable procedures and treating other patients. Critical care was necessary to treat or prevent imminent or life-threatening deterioration. Critical care was time spent personally by me on the following activities: development of treatment plan with patient and/or surrogate as well as nursing, discussions with consultants, evaluation of patient's response to treatment, examination of patient, obtaining history from patient or surrogate, ordering and performing treatments and interventions, ordering and review of laboratory studies, ordering and review of radiographic studies, pulse oximetry and re-evaluation of patient's condition. Patient with multifocal pneumonia and hypoxia.  She will be admitted to medicine Final  Clinical Impression(s) / ED Diagnoses Final diagnoses:  Community acquired pneumonia, unspecified laterality    Rx / DC Orders ED Discharge Orders    None       Bethann Berkshire, MD 10/11/19 1919

## 2019-10-11 NOTE — ED Notes (Signed)
pts daughter given update per pt approval

## 2019-10-11 NOTE — ED Triage Notes (Signed)
Pt has been feeling very weak the last few days. Today patient fell on the ground from weakness. Denies hitting head. Has been feeling SOB today with pain with inspiration. O2 sats 86% on RA when EMS arrived.

## 2019-10-12 ENCOUNTER — Inpatient Hospital Stay (HOSPITAL_COMMUNITY): Payer: Medicare Other | Admitting: Anesthesiology

## 2019-10-12 ENCOUNTER — Other Ambulatory Visit: Payer: Self-pay

## 2019-10-12 ENCOUNTER — Inpatient Hospital Stay (HOSPITAL_COMMUNITY): Payer: Medicare Other

## 2019-10-12 ENCOUNTER — Encounter (HOSPITAL_COMMUNITY): Payer: Self-pay | Admitting: Internal Medicine

## 2019-10-12 DIAGNOSIS — F3342 Major depressive disorder, recurrent, in full remission: Secondary | ICD-10-CM

## 2019-10-12 DIAGNOSIS — A419 Sepsis, unspecified organism: Secondary | ICD-10-CM

## 2019-10-12 DIAGNOSIS — J189 Pneumonia, unspecified organism: Secondary | ICD-10-CM

## 2019-10-12 DIAGNOSIS — I5031 Acute diastolic (congestive) heart failure: Secondary | ICD-10-CM

## 2019-10-12 DIAGNOSIS — J181 Lobar pneumonia, unspecified organism: Secondary | ICD-10-CM

## 2019-10-12 DIAGNOSIS — G894 Chronic pain syndrome: Secondary | ICD-10-CM

## 2019-10-12 DIAGNOSIS — J9601 Acute respiratory failure with hypoxia: Secondary | ICD-10-CM

## 2019-10-12 LAB — COMPREHENSIVE METABOLIC PANEL
ALT: 23 U/L (ref 0–44)
AST: 26 U/L (ref 15–41)
Albumin: 2.8 g/dL — ABNORMAL LOW (ref 3.5–5.0)
Alkaline Phosphatase: 48 U/L (ref 38–126)
Anion gap: 8 (ref 5–15)
BUN: 12 mg/dL (ref 6–20)
CO2: 23 mmol/L (ref 22–32)
Calcium: 7.6 mg/dL — ABNORMAL LOW (ref 8.9–10.3)
Chloride: 108 mmol/L (ref 98–111)
Creatinine, Ser: 0.69 mg/dL (ref 0.44–1.00)
GFR calc Af Amer: >60 mL/min (ref >60)
GFR calc non Af Amer: >60 mL/min (ref >60)
Glucose, Bld: 132 mg/dL — ABNORMAL HIGH (ref 70–99)
Potassium: 4 mmol/L (ref 3.5–5.1)
Sodium: 139 mmol/L (ref 135–145)
Total Bilirubin: 0.6 mg/dL (ref 0.3–1.2)
Total Protein: 6.1 g/dL — ABNORMAL LOW (ref 6.5–8.1)

## 2019-10-12 LAB — PROCALCITONIN: Procalcitonin: 0.24 ng/mL

## 2019-10-12 LAB — RAPID URINE DRUG SCREEN, HOSP PERFORMED
Amphetamines: NOT DETECTED
Barbiturates: NOT DETECTED
Benzodiazepines: POSITIVE — AB
Cocaine: NOT DETECTED
Opiates: POSITIVE — AB
Tetrahydrocannabinol: NOT DETECTED

## 2019-10-12 LAB — ECHOCARDIOGRAM COMPLETE
Height: 62 in
Weight: 2433.88 oz

## 2019-10-12 LAB — CBC
HCT: 39.3 % (ref 36.0–46.0)
Hemoglobin: 12.3 g/dL (ref 12.0–15.0)
MCH: 30.7 pg (ref 26.0–34.0)
MCHC: 31.3 g/dL (ref 30.0–36.0)
MCV: 98 fL (ref 80.0–100.0)
Platelets: 165 10*3/uL (ref 150–400)
RBC: 4.01 MIL/uL (ref 3.87–5.11)
RDW: 13.8 % (ref 11.5–15.5)
WBC: 15.1 10*3/uL — ABNORMAL HIGH (ref 4.0–10.5)
nRBC: 0 % (ref 0.0–0.2)

## 2019-10-12 LAB — URINALYSIS, ROUTINE W REFLEX MICROSCOPIC
Bilirubin Urine: NEGATIVE
Glucose, UA: NEGATIVE mg/dL
Ketones, ur: NEGATIVE mg/dL
Leukocytes,Ua: NEGATIVE
Nitrite: NEGATIVE
Protein, ur: NEGATIVE mg/dL
Specific Gravity, Urine: 1.023 (ref 1.005–1.030)
pH: 6 (ref 5.0–8.0)

## 2019-10-12 LAB — BLOOD GAS, ARTERIAL
Acid-base deficit: 0.4 mmol/L (ref 0.0–2.0)
Bicarbonate: 24.1 mmol/L (ref 20.0–28.0)
FIO2: 100
O2 Saturation: 98.8 %
Patient temperature: 38.3
pCO2 arterial: 39.4 mmHg (ref 32.0–48.0)
pH, Arterial: 7.398 (ref 7.350–7.450)
pO2, Arterial: 173 mmHg — ABNORMAL HIGH (ref 83.0–108.0)

## 2019-10-12 LAB — CORTISOL-AM, BLOOD: Cortisol - AM: 30.1 ug/dL — ABNORMAL HIGH (ref 6.7–22.6)

## 2019-10-12 LAB — MRSA PCR SCREENING: MRSA by PCR: NEGATIVE

## 2019-10-12 LAB — GLUCOSE, CAPILLARY
Glucose-Capillary: 132 mg/dL — ABNORMAL HIGH (ref 70–99)
Glucose-Capillary: 111 mg/dL — ABNORMAL HIGH (ref 70–99)

## 2019-10-12 LAB — HIV ANTIBODY (ROUTINE TESTING W REFLEX): HIV Screen 4th Generation wRfx: NONREACTIVE

## 2019-10-12 LAB — MAGNESIUM: Magnesium: 1.6 mg/dL — ABNORMAL LOW (ref 1.7–2.4)

## 2019-10-12 LAB — SARS CORONAVIRUS 2 (TAT 6-24 HRS): SARS Coronavirus 2: NEGATIVE

## 2019-10-12 LAB — PHOSPHORUS: Phosphorus: 2.3 mg/dL — ABNORMAL LOW (ref 2.5–4.6)

## 2019-10-12 MED ORDER — IPRATROPIUM-ALBUTEROL 0.5-2.5 (3) MG/3ML IN SOLN
3.0000 mL | Freq: Four times a day (QID) | RESPIRATORY_TRACT | Status: DC
  Administered 2019-10-12 – 2019-10-16 (×17): 3 mL via RESPIRATORY_TRACT
  Filled 2019-10-12 (×18): qty 3

## 2019-10-12 MED ORDER — MORPHINE SULFATE (PF) 2 MG/ML IV SOLN
1.0000 mg | Freq: Once | INTRAVENOUS | Status: AC
  Administered 2019-10-12: 03:00:00 1 mg via INTRAVENOUS
  Filled 2019-10-12: qty 1

## 2019-10-12 MED ORDER — PROPOFOL 10 MG/ML IV BOLUS
INTRAVENOUS | Status: DC | PRN
  Administered 2019-10-12: 50 mg via INTRAVENOUS

## 2019-10-12 MED ORDER — CHLORHEXIDINE GLUCONATE 0.12% ORAL RINSE (MEDLINE KIT)
15.0000 mL | Freq: Two times a day (BID) | OROMUCOSAL | Status: DC
  Administered 2019-10-12 – 2019-10-16 (×9): 15 mL via OROMUCOSAL

## 2019-10-12 MED ORDER — SUCCINYLCHOLINE CHLORIDE 20 MG/ML IJ SOLN
INTRAMUSCULAR | Status: DC | PRN
  Administered 2019-10-12: 100 mg via INTRAVENOUS

## 2019-10-12 MED ORDER — LORAZEPAM 2 MG/ML IJ SOLN
0.5000 mg | Freq: Four times a day (QID) | INTRAMUSCULAR | Status: DC | PRN
  Administered 2019-10-12 – 2019-10-15 (×2): 0.5 mg via INTRAVENOUS
  Filled 2019-10-12 (×2): qty 1

## 2019-10-12 MED ORDER — PROPOFOL 10 MG/ML IV BOLUS: INTRAVENOUS | Status: DC | PRN

## 2019-10-12 MED ORDER — IPRATROPIUM-ALBUTEROL 0.5-2.5 (3) MG/3ML IN SOLN: 3.0000 mL | RESPIRATORY_TRACT | Status: DC | PRN

## 2019-10-12 MED ORDER — FENTANYL 2500MCG IN NS 250ML (10MCG/ML) PREMIX INFUSION
0.0000 ug/h | INTRAVENOUS | Status: DC
  Administered 2019-10-12: 50 ug/h via INTRAVENOUS
  Administered 2019-10-13: 200 ug/h via INTRAVENOUS
  Administered 2019-10-14: 18:00:00 225 ug/h via INTRAVENOUS
  Administered 2019-10-14: 250 ug/h via INTRAVENOUS
  Administered 2019-10-15: 17:00:00 100 ug/h via INTRAVENOUS
  Administered 2019-10-15: 05:00:00 250 ug/h via INTRAVENOUS
  Filled 2019-10-12 (×6): qty 250

## 2019-10-12 MED ORDER — PROPOFOL 1000 MG/100ML IV EMUL
INTRAVENOUS | Status: AC
  Administered 2019-10-12: 13:00:00 5 ug/kg/min via INTRAVENOUS
  Filled 2019-10-12: qty 100

## 2019-10-12 MED ORDER — POTASSIUM PHOSPHATE MONOBASIC 500 MG PO TABS
500.0000 mg | ORAL_TABLET | Freq: Two times a day (BID) | ORAL | Status: DC
  Filled 2019-10-12 (×7): qty 1

## 2019-10-12 MED ORDER — MIDAZOLAM HCL 5 MG/5ML IJ SOLN
INTRAMUSCULAR | Status: DC | PRN
  Administered 2019-10-12: 4 mg via INTRAVENOUS

## 2019-10-12 MED ORDER — PANTOPRAZOLE SODIUM 40 MG IV SOLR
40.0000 mg | INTRAVENOUS | Status: DC
  Administered 2019-10-12 – 2019-10-17 (×6): 40 mg via INTRAVENOUS
  Filled 2019-10-12 (×6): qty 40

## 2019-10-12 MED ORDER — MORPHINE SULFATE (PF) 2 MG/ML IV SOLN
1.0000 mg | Freq: Once | INTRAVENOUS | Status: AC | PRN
  Administered 2019-10-12: 1 mg via INTRAVENOUS
  Filled 2019-10-12: qty 1

## 2019-10-12 MED ORDER — BUDESONIDE 0.5 MG/2ML IN SUSP
0.5000 mg | Freq: Two times a day (BID) | RESPIRATORY_TRACT | Status: DC
  Administered 2019-10-12 – 2019-10-17 (×10): 0.5 mg via RESPIRATORY_TRACT
  Filled 2019-10-12 (×10): qty 2

## 2019-10-12 MED ORDER — MIDAZOLAM HCL 2 MG/2ML IJ SOLN
INTRAMUSCULAR | Status: AC
  Administered 2019-10-12: 13:00:00 2 mg
  Filled 2019-10-12: qty 2

## 2019-10-12 MED ORDER — K PHOS MONO-SOD PHOS DI & MONO 155-852-130 MG PO TABS
250.0000 mg | ORAL_TABLET | Freq: Two times a day (BID) | ORAL | Status: DC
  Administered 2019-10-12 – 2019-10-14 (×4): 250 mg via ORAL
  Filled 2019-10-12 (×4): qty 1

## 2019-10-12 MED ORDER — MORPHINE SULFATE (PF) 2 MG/ML IV SOLN
1.0000 mg | INTRAVENOUS | Status: DC | PRN
  Administered 2019-10-12 – 2019-10-17 (×6): 1 mg via INTRAVENOUS
  Filled 2019-10-12 (×7): qty 1

## 2019-10-12 MED ORDER — VITAL AF 1.2 CAL PO LIQD
1000.0000 mL | ORAL | Status: DC
  Administered 2019-10-12 – 2019-10-15 (×3): 1000 mL

## 2019-10-12 MED ORDER — NOREPINEPHRINE 4 MG/250ML-% IV SOLN
INTRAVENOUS | Status: AC
  Filled 2019-10-12: qty 250

## 2019-10-12 MED ORDER — PRO-STAT SUGAR FREE PO LIQD
30.0000 mL | Freq: Every day | ORAL | Status: DC
  Administered 2019-10-12 – 2019-10-16 (×5): 30 mL
  Filled 2019-10-12 (×5): qty 30

## 2019-10-12 MED ORDER — SODIUM CHLORIDE 0.9 % IV SOLN: 2.0000 g | INTRAVENOUS | Status: DC

## 2019-10-12 MED ORDER — PIPERACILLIN-TAZOBACTAM 3.375 G IVPB
3.3750 g | Freq: Three times a day (TID) | INTRAVENOUS | Status: AC
  Administered 2019-10-12 – 2019-10-14 (×9): 3.375 g via INTRAVENOUS
  Filled 2019-10-12 (×9): qty 50

## 2019-10-12 MED ORDER — MIDAZOLAM HCL (PF) 10 MG/2ML IJ SOLN
INTRAMUSCULAR | Status: AC
  Filled 2019-10-12: qty 2

## 2019-10-12 MED ORDER — FENTANYL CITRATE (PF) 100 MCG/2ML IJ SOLN
INTRAMUSCULAR | Status: AC
  Filled 2019-10-12: qty 2

## 2019-10-12 MED ORDER — CHLORHEXIDINE GLUCONATE CLOTH 2 % EX PADS
6.0000 | MEDICATED_PAD | Freq: Every day | CUTANEOUS | Status: DC
  Administered 2019-10-12 – 2019-10-17 (×6): 6 via TOPICAL

## 2019-10-12 MED ORDER — "THROMBI-PAD 3""X3"" EX PADS"
1.0000 | MEDICATED_PAD | Freq: Once | CUTANEOUS | Status: AC
  Administered 2019-10-12: 1 via TOPICAL
  Filled 2019-10-12: qty 1

## 2019-10-12 MED ORDER — MAGNESIUM SULFATE 2 GM/50ML IV SOLN
2.0000 g | Freq: Once | INTRAVENOUS | Status: AC
  Administered 2019-10-12: 10:00:00 2 g via INTRAVENOUS
  Filled 2019-10-12: qty 50

## 2019-10-12 MED ORDER — ORAL CARE MOUTH RINSE
15.0000 mL | OROMUCOSAL | Status: DC
  Administered 2019-10-12 – 2019-10-16 (×41): 15 mL via OROMUCOSAL

## 2019-10-12 MED ORDER — HYDROCODONE-HOMATROPINE 5-1.5 MG/5ML PO SYRP
5.0000 mL | ORAL_SOLUTION | ORAL | Status: DC | PRN
  Administered 2019-10-12: 5 mL via ORAL
  Filled 2019-10-12: qty 5

## 2019-10-12 MED ORDER — FENTANYL CITRATE (PF) 100 MCG/2ML IJ SOLN
100.0000 ug | Freq: Once | INTRAMUSCULAR | Status: AC
  Administered 2019-10-12: 18:00:00 100 ug via INTRAVENOUS

## 2019-10-12 MED ORDER — SODIUM CHLORIDE 0.9 % IV SOLN
INTRAVENOUS | Status: DC | PRN
  Administered 2019-10-12 – 2019-10-18 (×2): 250 mL via INTRAVENOUS

## 2019-10-12 MED ORDER — LACTATED RINGERS IV SOLN: INTRAVENOUS | Status: DC

## 2019-10-12 MED ORDER — NOREPINEPHRINE 4 MG/250ML-% IV SOLN
0.0000 ug/min | INTRAVENOUS | Status: DC
  Administered 2019-10-12: 01:00:00 2 ug/min via INTRAVENOUS
  Administered 2019-10-12: 4 ug/min via INTRAVENOUS
  Administered 2019-10-13 (×2): 5 ug/min via INTRAVENOUS
  Administered 2019-10-14: 17:00:00 3 ug/min via INTRAVENOUS
  Administered 2019-10-15: 16:00:00 2 ug/min via INTRAVENOUS
  Filled 2019-10-12 (×4): qty 250

## 2019-10-12 MED ORDER — PROPOFOL 1000 MG/100ML IV EMUL
0.0000 ug/kg/min | INTRAVENOUS | Status: DC
  Administered 2019-10-12 (×3): 50 ug/kg/min via INTRAVENOUS
  Administered 2019-10-13: 35 ug/kg/min via INTRAVENOUS
  Administered 2019-10-13: 45 ug/kg/min via INTRAVENOUS
  Filled 2019-10-12 (×4): qty 100

## 2019-10-12 NOTE — Progress Notes (Addendum)
PROGRESS NOTE  Paula Bailey ZDG:[id number] DOB: Jan 07, 1961 DOA: 10/11/2019 PCP: Ignatius Specking, MD  Brief History:  59 year old female with a history of major depressive disorder, hyperlipidemia, anxiety, tobacco abuse presenting with 3 to 4-day history of shortness of breath, coughing, and generalized weakness.  The patient denied any fevers, chills, chest pain, nausea, vomiting, diarrhea, chronic pain.  She denies any headache or neck pain.  She continues to smoke 1/2 pack/day but has been smoking up to 1 pack/day for last 25 years.  She denies any alcohol or illegal drug use.  She denies any hemoptysis, hematochezia, melena, hematuria.  She denies any new medications. In the emergency department, the patient had low-grade temperature of 99.2 F.  She became hypotensive with a blood pressure in the 80s.  Right IJ central line was placed.  The patient was started on Levophed.  BMP was essentially unremarkable.  WBC 15.7, hemoglobin 13.7, platelets 152,000.  UA 0-5 WBC.  Procalcitonin 0.29.  Lactic acid 1.6.  D-dimer 1.70.  CT angiogram chest was negative pulmonary blister or lymphadenopathy.  There is bilateral pleural effusions with diffuse groundglass opacities.  The patient was started on intravenous antibiotics and IV fluids.  Assessment/Plan: Sepsis -Present on admission -Secondary to pneumonia -Lactic acid 1.6 -Procalcitonin 0.29 -Broaden antibiotic coverage pending culture data -Continue azithromycin -MRSA PCR negative  Acute respiratory failure with hypoxia -Secondary to pneumonia in the setting of COPD -Presently on nonrebreather 100% -Wean oxygen as tolerated -Personally reviewed chest x-ray--bilateral infiltrates, right greater than left, increased interstitial markings, groundglass opacities -SARS-CoV2--neg  Lobar pneumonia -Start Zosyn -Continue azithromycin  Tobacco abuse -Tobacco cessation discussed -NicoDerm patch  Chronic pain syndrome -PMP queried--she  receives monthly percocet 7.5/325 and xanax 0.5 mg from Dr. Gerilyn Pilgrim -IV morphine for now due to pt's tenuous respiratory status  Depression/anxiety -Continue home dose of sertraline -Ativan as needed  Hypomagnesemia/Hypophosphatemia -replete  Hyperlipidemia -continue statin       Disposition Plan:  From Home;  Remains critically ill on vasopressor support Family Communication:  No Family at bedside  Consultants:  none  Code Status:  FULL  DVT Prophylaxis:   [place] Lovenox   Procedures: As Listed in Progress Note Above  Antibiotics: Azithromycin 2/11>>> Zosyn 2/12>>>   The patient is critically ill with multiple organ systems failure and requires high complexity decision making for assessment and support, frequent evaluation and titration of therapies, application of advanced monitoring technologies and extensive interpretation of multiple databases.  Critical care time - 45 mins.      Subjective: Pt states her breathing is a little better, but continues to be sob with dry cough.  Patient denies fevers, chills, headache, chest pain, nausea, vomiting, diarrhea, abdominal pain, dysuria, hematuria, hematochezia, and melena.   Objective: Vitals:   10/12/19 0530 10/12/19 0545 10/12/19 0600 10/12/19 0615  BP: (!) 128/59 125/60 123/63   Pulse: (!) 109 (!) 110 (!) 108 (!) 109  Resp: (!) 28 (!) 44 (!) 39 (!) 30  Temp:      TempSrc:      SpO2: 96% 92% 93% 93%  Weight:      Height:        Intake/Output Summary (Last 24 hours) at 10/12/2019 0806 Last data filed at 10/12/2019 0600 Gross per 24 hour  Intake 618 ml  Output 650 ml  Net -32 ml   Weight change:  Exam:   General:  Pt is alert, follows commands appropriately, not in acute  distress  HEENT: No icterus, No thrush, No neck mass, [place]/AT  Cardiovascular: RRR, S1/S2, no rubs, no gallops  Respiratory: bilateral crackles.  Diminished breath sounds  Abdomen: Soft/+BS, non tender, non distended, no  guarding  Extremities: No edema, No lymphangitis, No petechiae, No rashes, no synovitis   Data Reviewed: I have personally reviewed following labs and imaging studies Basic Metabolic Panel: Recent Labs  Lab 10/11/19 1708 10/12/19 0350  NA 133* 139  K 4.5 4.0  CL 97* 108  CO2 27 23  GLUCOSE 111* 132*  BUN 17 12  CREATININE 0.94 0.69  CALCIUM 8.8* 7.6*  MG  --  1.6*  PHOS  --  2.3*   Liver Function Tests: Recent Labs  Lab 10/11/19 1708 10/12/19 0350  AST 38 26  ALT 28 23  ALKPHOS 60 48  BILITOT 0.8 0.6  PROT 7.3 6.1*  ALBUMIN 3.6 2.8*   No results for input(s): LIPASE, AMYLASE in the last 168 hours. No results for input(s): AMMONIA in the last 168 hours. Coagulation Profile: Recent Labs  Lab 10/11/19 1708  INR 1.2   CBC: Recent Labs  Lab 10/11/19 1708 10/12/19 0350  WBC 15.7* 15.1*  NEUTROABS 13.3*  --   HGB 13.7 12.3  HCT 42.6 39.3  MCV 96.2 98.0  PLT 152 165   Cardiac Enzymes: Recent Labs  Lab 10/11/19 1837  CKTOTAL 366*   BNP: Invalid input(s): POCBNP CBG: No results for input(s): GLUCAP in the last 168 hours. HbA1C: No results for input(s): HGBA1C in the last 72 hours. Urine analysis:    Component Value Date/Time   COLORURINE YELLOW 10/11/2019 2349   APPEARANCEUR CLEAR 10/11/2019 2349   LABSPEC 1.023 10/11/2019 2349   PHURINE 6.0 10/11/2019 2349   GLUCOSEU NEGATIVE 10/11/2019 2349   HGBUR MODERATE (A) 10/11/2019 2349   BILIRUBINUR NEGATIVE 10/11/2019 2349   KETONESUR NEGATIVE 10/11/2019 2349   PROTEINUR NEGATIVE 10/11/2019 2349   NITRITE NEGATIVE 10/11/2019 2349   LEUKOCYTESUR NEGATIVE 10/11/2019 2349   Sepsis Labs: @LABRCNTIP (procalcitonin:4,lacticidven:4) ) Recent Results (from the past 240 hour(s))  Respiratory Panel by RT PCR (Flu A&B, Covid) - Nasopharyngeal Swab     Status: None   Collection Time: 10/11/19  4:41 PM   Specimen: Nasopharyngeal Swab  Result Value Ref Range Status   SARS Coronavirus 2 by RT PCR NEGATIVE  NEGATIVE Final    Comment: (NOTE) SARS-CoV-2 target nucleic acids are NOT DETECTED. The SARS-CoV-2 RNA is generally detectable in upper respiratoy specimens during the acute phase of infection. The lowest concentration of SARS-CoV-2 viral copies this assay can detect is 131 copies/mL. A negative result does not preclude SARS-Cov-2 infection and should not be used as the sole basis for treatment or other patient management decisions. A negative result may occur with  improper specimen collection/handling, submission of specimen other than nasopharyngeal swab, presence of viral mutation(s) within the areas targeted by this assay, and inadequate number of viral copies (<131 copies/mL). A negative result must be combined with clinical observations, patient history, and epidemiological information. The expected result is Negative. Fact Sheet for Patients:  12/09/19 Fact Sheet for Healthcare Providers:  https://www.moore.com/ This test is not yet ap proved or cleared by the https://www.young.biz/ FDA and  has been authorized for detection and/or diagnosis of SARS-CoV-2 by FDA under an Emergency Use Authorization (EUA). This EUA will remain  in effect (meaning this test can be used) for the duration of the COVID-19 declaration under Section 564(b)(1) of the Act, 21 U.S.C. section 360bbb-3(b)(1),  unless the authorization is terminated or revoked sooner.    Influenza A by PCR NEGATIVE NEGATIVE Final   Influenza B by PCR NEGATIVE NEGATIVE Final    Comment: (NOTE) The Xpert Xpress SARS-CoV-2/FLU/RSV assay is intended as an aid in  the diagnosis of influenza from Nasopharyngeal swab specimens and  should not be used as a sole basis for treatment. Nasal washings and  aspirates are unacceptable for Xpert Xpress SARS-CoV-2/FLU/RSV  testing. Fact Sheet for Patients: https://www.moore.com/ Fact Sheet for Healthcare  Providers: https://www.young.biz/ This test is not yet approved or cleared by the [place] FDA and  has been authorized for detection and/or diagnosis of SARS-CoV-2 by  FDA under an Emergency Use Authorization (EUA). This EUA will remain  in effect (meaning this test can be used) for the duration of the  Covid-19 declaration under Section 564(b)(1) of the Act, 21  U.S.C. section 360bbb-3(b)(1), unless the authorization is  terminated or revoked. Performed at [facility], [address], [place], [place] [address]   Blood Culture (routine x 2)     Status: None (Preliminary result)   Collection Time: 10/11/19  5:08 PM   Specimen: BLOOD  Result Value Ref Range Status   Specimen Description BLOOD LEFT ANTECUBITAL  Final   Special Requests AEROBIC BOTTLE ONLY Blood Culture adequate volume  Final   Culture   Final    NO GROWTH < 24 HOURS Performed at [facility], [address], [place], [place] [address]    Report Status PENDING  Incomplete  Blood Culture (routine x 2)     Status: None (Preliminary result)   Collection Time: 10/11/19  6:37 PM   Specimen: BLOOD  Result Value Ref Range Status   Specimen Description BLOOD RIGHT ANTECUBITAL  Final   Special Requests   Final    BOTTLES DRAWN AEROBIC AND ANAEROBIC Blood Culture adequate volume   Culture   Final    NO GROWTH < 12 HOURS Performed at [facility], [address] [place], [place] [address]    Report Status PENDING  Incomplete  Respiratory Panel by RT PCR (Flu A&B, Covid) - Nasopharyngeal Swab     Status: None   Collection Time: 10/11/19  6:40 PM   Specimen: Nasopharyngeal Swab  Result Value Ref Range Status   SARS Coronavirus 2 by RT PCR NEGATIVE NEGATIVE Final    Comment: (NOTE) SARS-CoV-2 target nucleic acids are NOT DETECTED. The SARS-CoV-2 RNA is generally detectable in upper respiratoy specimens during the acute phase of infection. The lowest concentration of SARS-CoV-2 viral copies this assay  can detect is 131 copies/mL. A negative result does not preclude SARS-Cov-2 infection and should not be used as the sole basis for treatment or other patient management decisions. A negative result may occur with  improper specimen collection/handling, submission of specimen other than nasopharyngeal swab, presence of viral mutation(s) within the areas targeted by this assay, and inadequate number of viral copies (<131 copies/mL). A negative result must be combined with clinical observations, patient history, and epidemiological information. The expected result is Negative. Fact Sheet for Patients:  https://www.moore.com/ Fact Sheet for Healthcare Providers:  https://www.young.biz/ This test is not yet ap proved or cleared by the [place] FDA and  has been authorized for detection and/or diagnosis of SARS-CoV-2 by FDA under an Emergency Use Authorization (EUA). This EUA will remain  in effect (meaning this test can be used) for the duration of the COVID-19 declaration under Section 564(b)(1) of the Act, 21 U.S.C. section 360bbb-3(b)(1),  unless the authorization is terminated or revoked sooner.    Influenza A by PCR NEGATIVE NEGATIVE Final   Influenza B by PCR NEGATIVE NEGATIVE Final    Comment: (NOTE) The Xpert Xpress SARS-CoV-2/FLU/RSV assay is intended as an aid in  the diagnosis of influenza from Nasopharyngeal swab specimens and  should not be used as a sole basis for treatment. Nasal washings and  aspirates are unacceptable for Xpert Xpress SARS-CoV-2/FLU/RSV  testing. Fact Sheet for Patients: https://www.moore.com/ Fact Sheet for Healthcare Providers: https://www.young.biz/ This test is not yet approved or cleared by the [place] FDA and  has been authorized for detection and/or diagnosis of SARS-CoV-2 by  FDA under an Emergency Use Authorization (EUA). This EUA will remain  in effect  (meaning this test can be used) for the duration of the  Covid-19 declaration under Section 564(b)(1) of the Act, 21  U.S.C. section 360bbb-3(b)(1), unless the authorization is  terminated or revoked. Performed at [facility], [address] [place], [place] [address]   MRSA PCR Screening     Status: None   Collection Time: 10/12/19  2:07 AM   Specimen: Nasopharyngeal  Result Value Ref Range Status   MRSA by PCR NEGATIVE NEGATIVE Final    Comment:        The GeneXpert MRSA Assay (FDA approved for NASAL specimens only), is one component of a comprehensive MRSA colonization surveillance program. It is not intended to diagnose MRSA infection nor to guide or monitor treatment for MRSA infections. Performed at [facility], [address], [place], [place] [address]      Scheduled Meds: . amitriptyline  20 mg Oral QHS  . Chlorhexidine Gluconate Cloth  6 each Topical Daily  . dextromethorphan-guaiFENesin  1 tablet Oral BID  . enoxaparin (LOVENOX) injection  40 mg Subcutaneous Q24H  . lidocaine  1 patch Transdermal Q24H  . nicotine  14 mg Transdermal Daily  . potassium phosphate (monobasic)  500 mg Oral BID WC  . sertraline  150 mg Oral Daily  . simvastatin  10 mg Oral QHS   Continuous Infusions: . azithromycin    . cefTRIAXone (ROCEPHIN)  IV    . magnesium sulfate bolus IVPB    . norepinephrine (LEVOPHED) Adult infusion 2 mcg/min (10/12/19 0600)    Procedures/Studies: CT ANGIO CHEST PE W OR WO CONTRAST  Result Date: 10/11/2019 CLINICAL DATA:  Shortness of breath and weakness. Positive D-dimer. EXAM: CT ANGIOGRAPHY CHEST WITH CONTRAST TECHNIQUE: Multidetector CT imaging of the chest was performed using the standard protocol during bolus administration of intravenous contrast. Multiplanar CT image reconstructions and MIPs were obtained to evaluate the vascular anatomy. CONTRAST:  OMNIPAQUE IOHEXOL 350 MG/ML SOLN COMPARISON:  Chest radiograph 10/11/2019 FINDINGS:  Cardiovascular: Contrast injection is sufficient to demonstrate satisfactory opacification of the pulmonary arteries to the segmental level. There is no pulmonary embolus or evidence of right heart strain. The size of the main pulmonary artery is normal. Heart size is normal, with no pericardial effusion. The course and caliber of the aorta are normal. There is mild atherosclerotic calcification. Opacification decreased due to pulmonary arterial phase contrast bolus timing. Mediastinum/Nodes: No mediastinal, hilar or axillary lymphadenopathy. The visualized thyroid and thoracic esophageal course are unremarkable. Lungs/Pleura: Small bilateral pleural effusions. Diffuse ground-glass opacities throughout both lungs. Upper Abdomen: Contrast bolus timing is not optimized for evaluation of the abdominal organs. The visualized portions of the organs of the upper abdomen are normal. Musculoskeletal: No chest wall abnormality. No bony spinal canal stenosis. Review  of the MIP images confirms the above findings. IMPRESSION: 1. No pulmonary embolus or acute aortic syndrome. 2. Small bilateral pleural effusions and diffuse ground-glass opacities throughout both lungs, which could indicate infection or pulmonary edema. Aortic Atherosclerosis (ICD10-I70.0). Electronically Signed   By: Ulyses Jarred M.D.   On: 10/11/2019 22:26   DG Chest Portable 1 View  Result Date: 10/12/2019 CLINICAL DATA:  Central line placement verification. EXAM: PORTABLE CHEST 1 VIEW COMPARISON:  10/11/2019 FINDINGS: Right internal jugular central line tip in the SVC 3 cm above the right atrium. No pneumothorax. Widespread pulmonary density persists, worse on the right than the left. Findings could be due to acute pulmonary edema or widespread pneumonia. IMPRESSION: Central line tip in the SVC 3 cm above the right atrium. Persistent and somewhat worsened diffuse lung density, asymmetrically more pronounced on the right. Electronically Signed   By: Nelson Chimes M.D.   On: 10/12/2019 01:30   DG Chest Portable 1 View  Result Date: 10/11/2019 CLINICAL DATA:  Weakness for several days, short of breath, pain with inspiration EXAM: PORTABLE CHEST 1 VIEW COMPARISON:  11/01/2016 FINDINGS: Single frontal view of the chest demonstrates an unremarkable cardiac silhouette. There is widespread interstitial and ground-glass opacities. No effusion or pneumothorax. No acute bony abnormalities. IMPRESSION: 1. Widespread bilateral ground-glass airspace disease consistent with multifocal pneumonia. Findings are suspicious for COVID-19 pneumonia. Electronically Signed   By: Randa Ngo M.D.   On: 10/11/2019 18:06    Orson Eva, DO  [facility] Pager [phone]  If 7PM-7AM, please contact night-coverage [email or web address] Password [email or web address] 10/12/2019, 8:06 AM   LOS: 1 day

## 2019-10-12 NOTE — ED Provider Notes (Signed)
I was asked by hospitalist to evaluate patient for central line placement.  Patient is currently being admitted, has become hypotensive and will need to go to the ICU.  Marland KitchenCentral Line  Date/Time: 10/12/2019 12:59 AM Performed by: Gilda Crease, MD Authorized by: Gilda Crease, MD   Consent:    Consent obtained:  Verbal   Consent given by:  Patient   Risks discussed:  Arterial puncture, incorrect placement, nerve damage, pneumothorax, infection and bleeding Universal protocol:    Procedure explained and questions answered to patient or proxy's satisfaction: yes     Site/side marked: yes     Immediately prior to procedure, a time out was called: yes     Patient identity confirmed:  Verbally with patient Pre-procedure details:    Hand hygiene: Hand hygiene performed prior to insertion     Sterile barrier technique: All elements of maximal sterile technique followed     Skin preparation:  2% chlorhexidine   Skin preparation agent: Skin preparation agent completely dried prior to procedure   Anesthesia (see MAR for exact dosages):    Anesthesia method:  Local infiltration   Local anesthetic:  Lidocaine 1% w/o epi Procedure details:    Location:  R internal jugular   Patient position:  Trendelenburg   Procedural supplies:  Triple lumen   Catheter size:  7 Fr   Landmarks identified: yes     Ultrasound guidance: yes     Sterile ultrasound techniques: Sterile gel and sterile probe covers were used     Number of attempts:  1   Successful placement: yes   Post-procedure details:    Post-procedure:  Dressing applied and line sutured   Assessment:  Blood return through all ports, free fluid flow, no pneumothorax on x-ray and placement verified by x-ray   Patient tolerance of procedure:  Tolerated well, no immediate complications     Gilda Crease, MD 10/12/19 0301

## 2019-10-12 NOTE — Progress Notes (Signed)
Report recvd from [facility] before [facility] transport. Recvd from [facility] and report updated at bs at 1735. Pt with RASS 3 shortly after arrival. Dr.Marshall updated and orders recvd. Patient's sister at bs and updated. Emotional support to patient and family.

## 2019-10-12 NOTE — Progress Notes (Signed)
Initial Nutrition Assessment  DOCUMENTATION CODES:   Not applicable  INTERVENTION:  Initiate Vital 1.2 @ 25 ml/hr, advance 10 ml every 8 hrs to goal rate 55 ml/hr with Pro-stat 30 ml daily via tube   Provides 1684 kcal (1848 total kcal with propofol at current rate), 114 grams protein, and 1069 ml free water  NUTRITION DIAGNOSIS:   Inadequate oral intake related to inability to eat as evidenced by NPO status.   GOAL:   Provide needs based on ASPEN/SCCM guidelines    MONITOR:   Labs, I & O's, Skin, Weight trends, TF tolerance, Vent status  REASON FOR ASSESSMENT:   Ventilator, Consult Enteral/tube feeding initiation and management  ASSESSMENT:  RD working remotely.  59 year old female with past medical history of HLD, COPD, depression and anxiety presented with 3 day history of generalized weakness, productive cough of clear sputum, and shortness of breath and reports reproducible chest pain which worsens with deep inspiration and cough. In ED, CXR consist with multifocal pneumonia.  Patient admitted for CAP.  Per notes, patient with episode of respiratory distress this morning, RR remaining in 35-40 range despite being on 15L NRB, morphine and ativan. Patient will not tolerate BiPAP well due to high level of anxiety and feelings of claustrophobia. Patient agreeable to intubation and mechanical ventilation.   Plans to transfer to [facility] after intubation due to no weekend CCM coverage.  Current wt 151.8 lbs  Patient is currently sedated and intubated on ventilator support MV: 13.7 L/min Temp (24hrs), Avg:99.7 F (37.6 C), Min:99.2 F (37.3 C), Max:100.9 F (38.3 C)  Propofol: 6.21 ml/hr provides 164 kcal    Medications reviewed and include: K Phos, Protonix Drips: Zithromax Lactated ringers Levo 2 mcg Zosyn Labs: BG 132, Mg 1.6 (L), Albumin 2.8 (L), Corrected Ca 8.56 (L), WBC 15.1 (H)  NUTRITION - FOCUSED PHYSICAL EXAM: Unable to complete at this time, RD working  remotely.  Diet Order:   Diet Order            Diet NPO time specified  Diet effective now              EDUCATION NEEDS:   Not appropriate for education at this time  Skin:  Skin Assessment: Reviewed RN Assessment  Last BM:  PTA  Height:   Ht Readings from Last 1 Encounters:  10/12/19 5\' 2"  (1.575 m)    Weight:   Wt Readings from Last 1 Encounters:  10/12/19 69 kg    Ideal Body Weight:  50 kg  BMI:  Body mass index is 27.82 kg/m.  Estimated Nutritional Needs:   Kcal:  1951  Protein:  104-117  Fluid:  >/= 1.9 L   12/10/19, RD, LDN Clinical Nutrition [facility] Telephone [phone] After Hours/Weekend Pager: [phone]

## 2019-10-12 NOTE — Consult Note (Addendum)
PULMONARY / CRITICAL CARE MEDICINE   NAME:  Paula Bailey, MRN:  [id number], DOB:  May 15, 1961, LOS: 1 ADMISSION DATE:  10/11/2019, CONSULTATION DATE: 2/12 REFERRING MD:  [facility], CHIEF COMPLAINT:  Sob/ severe desats  BRIEF HISTORY:    66 yowf active cigarette smoker carries dx of copd but no pfts  on file and just on saba prn  with acute onset 3 d PTA fatigue, cough prod mucoid, and progressive doe with diffust R>L as dz on cxr/ cta with viral screens neg rx as CAP with roc/zmax and 02 but deteriorated rapidly and by afternoon 2/12 refractory to 1.00 FIO2 so PCCM consulted.  HISTORY OF PRESENT ILLNESS   Pt in resp distress so hx per [facility]: 59 y.o. female with medical history significant for hyperlipidemia, depression, COPD, anxiety and tobacco abuse who presents to the emergency department due to 3-day onset of generalized weakness, productive cough (clear sputum) and shortness of breath.  Patient sustained a fall today due to weakness but denies hitting her head, EMS was activated and arrival of EMS patient was noted to be hypoxic with an O2 sat of 86% on room air, supplemental oxygen was provided and patient was taken to ED for further evaluation and management.  She complains of reproducible chest pain which worsens with deep inspiration and cough, but she denies change in taste or smell, patient denies diarrhea, fever, chills, numbness or tingling.  ED Course: In the emergency department, she was tachycardic and tachypneic, initial BP was 94/41 but this has since improved to 101/65.  Temperature 99.52F, she was provided with supplemental oxygen at 4 LPM with O2 sat at 94-97%.  Work-up in the ED ED showed leukocytosis, hyponatremia, BNP 141, SARS-CoV 2 RNA x2 was negative, influenza A and B negative.  Chest x-ray showed widespread bilateral groundglass airspace disease consistent with multifocal pneumonia with findings being suspicious for COVID-19 pneumonia.  She was treated with IV ceftriaxone and  azithromycin.  Hospitalist was asked to admit patient for further evaluation and management.   SIGNIFICANT PAST MEDICAL HISTORY   Depression/ anxiety Hyperlipidemia COPD not confirmed with pfts baseline no 02   SIGNIFICANT EVENTS:  Rapid resp decline x first 24 h > intubated  STUDIES:   CTa chest 2/11:  1. No pulmonary embolus or acute aortic syndrome. 2. Small bilateral pleural effusions and diffuse ground-glass opacities throughout both lungs, which could indicate infection or pulmonary edema. UDS 2/12 >>>  ID studies/ CULTURES:   RVP  2/11 neg influenza/ covid 19  BC x 2 2/11 >>>  Urine 2/11 >>> RVP 2/11 neg  HIV 2/11 neg  PCT 2/11 and 2/12 see flowsheet below  MRSA PCR  2/12 neg   ANTIBIOTICS:  Rocephin  2/11 only zmax 2/11 >>> Zosyn 2/12>>>  LINES/TUBES:  CVL  2/12 >>> ET  2/12 >>>                         CONSULTANTS:  PCCM   Scheduled Meds: . budesonide (PULMICORT) nebulizer solution  0.5 mg Nebulization BID  . chlorhexidine gluconate (MEDLINE KIT)  15 mL Mouth Rinse BID  . Chlorhexidine Gluconate Cloth  6 each Topical Daily  . enoxaparin (LOVENOX) injection  40 mg Subcutaneous Q24H  . ipratropium-albuterol  3 mL Nebulization Q6H  . lidocaine  1 patch Transdermal Q24H  . mouth rinse  15 mL Mouth Rinse 10 times per day  . midazolam PF      . nicotine  14 mg  Transdermal Daily  . pantoprazole (PROTONIX) IV  40 mg Intravenous Q24H  . phosphorus  250 mg Oral BID WC  . Thrombi-Pad  1 each Topical Once   Continuous Infusions: . azithromycin    . lactated ringers 75 mL/hr at 10/12/19 1348  . norepinephrine (LEVOPHED) Adult infusion 2 mcg/min (10/12/19 1352)  . piperacillin-tazobactam (ZOSYN)  IV 3.375 g (10/12/19 1331)  . propofol (DIPRIVAN) infusion 15 mcg/kg/min (10/12/19 1328)   PRN Meds:.acetaminophen **OR** acetaminophen, ipratropium-albuterol, LORazepam, morphine injection   SUBJECTIVE:  Anxious with high wob on 100% NRM denies cp    CONSTITUTIONAL: BP 127/67   Pulse (!) 118   Temp (!) 100.9 F (38.3 C) (Axillary)   Resp (!) 50   Ht 5' 2"  (1.575 m)   Wt 69 kg   SpO2 (!) 87%   BMI 27.82 kg/m   I/O last 3 completed shifts: In: 618 [P.O.:240; I.V.:35.5; IV Piggyback:342.5] Out: 650 [Urine:650]    Intake/Output Summary (Last 24 hours) at 10/12/2019 1410 Last data filed at 10/12/2019 1328 Gross per 24 hour  Intake 736.56 ml  Output 950 ml  Net -213.44 ml       Vent Mode: PRVC FiO2 (%):  [100 %] 100 % Set Rate:  [22 bmp] 22 bmp Vt Set:  [400 mL] 400 mL PEEP:  [8 cmH20] 8 cmH20  PHYSICAL EXAM: General:  Sedated post et  Neuro:  sedated HEENT:  Oral et  Cardiovascular:  RRR no s3 Lungs:  Diffuse insp crackles exp rhonchi Abdomen:  soft Musculoskeletal:  No deformitied Skin:  No rash or skin breakdown   RESOLVED PROBLEM LIST   ASSESSMENT AND PLAN   1) Rapidly evolving resp failure with diffuse R> L as dz typical of viral pna despite neg pcr x 2 - assume this is covid until proven otherwise while covering for CAP as you are but with nl PCT in pt with septic picture most likley this is not bacterial.  2) Likely evolving ARDS in this setting with very low threshold prone ventilation - for now use 7cc /kg/ watch for air trapping as reported to have copd   3) empriical abx as above reasonable but would also empirically rx with dex and remdesivir   SUMMARY OF TODAY'S PLAN:  Intubate/ stabilize for transfer as weekend PCCM coverage available at this time except thru [facility] / Goals of Care / Disposition.   DVT PROPHYLAXIS lovenox  SUP:ppi NUTRITION:npo MOBILITY:bedrest GOALS OF CARE:full cod FAMILY DISCUSSIONS: not available [facility]ITION [facility] to [facility] if not able to document Covid 19 by repeat PCR pending.   LABS  Glucose No results for input(s): GLUCAP in the last 168 hours.  BMET Recent Labs  Lab 10/11/19 1708 10/12/19 0350  NA 133* 139  K 4.5 4.0  CL 97* 108  CO2 27  23  BUN 17 12  CREATININE 0.94 0.69  GLUCOSE 111* 132*    Liver Enzymes Recent Labs  Lab 10/11/19 1708 10/12/19 0350  AST 38 26  ALT 28 23  ALKPHOS 60 48  BILITOT 0.8 0.6  ALBUMIN 3.6 2.8*    Electrolytes Recent Labs  Lab 10/11/19 1708 10/12/19 0350  CALCIUM 8.8* 7.6*  MG  --  1.6*  PHOS  --  2.3*    CBC Recent Labs  Lab 10/11/19 1708 10/12/19 0350  WBC 15.7* 15.1*  HGB 13.7 12.3  HCT 42.6 39.3  PLT 152 165    ABG No results for input(s): PHART, PCO2ART, PO2ART in the  last 168 hours.  Coag's Recent Labs  Lab 10/11/19 1708  APTT 28  INR 1.2    Sepsis Markers Recent Labs  Lab 10/11/19 1708 10/11/19 1837 10/12/19 0846  LATICACIDVEN 1.6  --   --   PROCALCITON  --  0.29 0.24    Cardiac Enzymes No results for input(s): TROPONINI, PROBNP in the last 168 hours.  PAST MEDICAL HISTORY :   She  has a past medical history of Dyslipidemia.  PAST SURGICAL HISTORY:  She  has no past surgical history on file.  No Known Allergies  No current facility-administered medications on file prior to encounter.   Current Outpatient Medications on File Prior to Encounter  Medication Sig  . alendronate (FOSAMAX) 70 MG tablet Take 70 mg by mouth once a week.  . ALPRAZolam (XANAX) 0.5 MG tablet Take 0.25 mg by mouth at bedtime.   Marland Kitchen amitriptyline (ELAVIL) 10 MG tablet Take 20 mg by mouth at bedtime.  . gabapentin (NEURONTIN) 300 MG capsule Take 600 mg by mouth 3 (three) times daily.   Marland Kitchen oxyCODONE-acetaminophen (PERCOCET) 7.5-325 MG tablet Take 1 tablet by mouth every 8 (eight) hours as needed for moderate pain or severe pain.   Marland Kitchen sertraline (ZOLOFT) 100 MG tablet Take one and a half tablets (150 mg) daily (Patient taking differently: Take 150 mg by mouth daily. )  . simvastatin (ZOCOR) 10 MG tablet Take 10 mg by mouth at bedtime.  . VENTOLIN HFA 108 (90 Base) MCG/ACT inhaler Inhale 1-2 puffs into the lungs every 4 (four) hours as needed for wheezing or shortness of  breath.     FAMILY HISTORY:   Her family history is not on file.  SOCIAL HISTORY:  She  reports that she has been smoking. She has a 5.00 pack-year smoking history. She has never used smokeless tobacco. She reports previous drug use. She reports that she does not drink alcohol.     The patient is critically ill with multiple organ systems failure and requires high complexity decision making for assessment and support, frequent evaluation and titration of therapies, application of advanced monitoring technologies and extensive interpretation of multiple databases. Critical Care Time devoted to patient care services described in this note is 45 minutes.    Christinia Gully, MD Pulmonary and [place] [phone] After 5:30 PM or weekends, use [facility] [phone]

## 2019-10-12 NOTE — Progress Notes (Addendum)
Responded to nursing call:  Respiratory distress Pt's RR remains in the 35-40 range throughout the morning despite being on 15L NRB, morphine and ativan. She developed respiratory distress after transferring in bed to get cleaned up.  RR up to 40s and oxygen saturation down into 70s, now remains in mid 80s.   Subjective: Pt complains of sob, worse than this am.  Complains cough, no hemoptysis.  No cp.  Vitals:   10/12/19 0800 10/12/19 0900 10/12/19 1000 10/12/19 1126  BP: 121/68 (!) 116/59 125/65   Pulse: (!) 113 (!) 112 (!) 117   Resp: (!) 25 (!) 28 (!) 36   Temp:  99.4 F (37.4 C)  (!) 100.9 F (38.3 C)  TempSrc:  Axillary  Axillary  SpO2: (!) 89% 90% (!) 88%   Weight:      Height:       CV--RRR Lung--bilateral rales.  Diminished BS bilateral Abd--soft+BS/NT   Assessment/Plan: Acute respiratory Failure -due to pneumonia in setting of underlying COPD -continues to smoke -SARS-CoV2 PCR--negative x 2 -pt will not tolerate BiPAP well due to her high level of anxiety and feelings of claustrophobia -discussed with patient regarding intubation and mechanical ventilation with which she agrees -briefly discussed with pulmonary, Dr. Sherene Sires who agrees  -continue antibiotics, BDs, pulmicort -plan to transfer to [facility] after intubation since no CCM coverage for the weekend--pt agrees -family updated -discussed with CCM at [facility] who agrees to accept patient in transfer    Total time 35 min in addition to time spent this am   Catarina Hartshorn, DO [facility]

## 2019-10-12 NOTE — Progress Notes (Signed)
*  PRELIMINARY RESULTS* Echocardiogram 2D Echocardiogram has been performed.  Paula Bailey 10/12/2019, 3:08 PM

## 2019-10-12 NOTE — Progress Notes (Signed)
eLink Physician-Brief Progress Note Patient Name: Meline Russaw DOB: 03/28/1961 MRN: [id number]   Date of Service  10/12/2019  HPI/Events of Note  Agitation - Request for bilateral soft wrist restraints.   eICU Interventions  Will order: 1. Bilateral soft wrist restraints X 11 hours.      Intervention Category Major Interventions: Delirium, psychosis, severe agitation - evaluation and management  Sherald Balbuena Eugene 10/12/2019, 10:41 PM

## 2019-10-12 NOTE — Plan of Care (Signed)
  Problem: Education: Goal: Knowledge of General Education information will improve Description Including pain rating scale, medication(s)/side effects and non-pharmacologic comfort measures Outcome: Progressing   Problem: Clinical Measurements: Goal: Respiratory complications will improve Outcome: Progressing   Problem: Coping: Goal: Level of anxiety will decrease Outcome: Progressing   Problem: Pain Managment: Goal: General experience of comfort will improve Outcome: Progressing   Problem: Safety: Goal: Ability to remain free from injury will improve Outcome: Progressing   Problem: Skin Integrity: Goal: Risk for impaired skin integrity will decrease Outcome: Progressing   

## 2019-10-12 NOTE — Anesthesia Procedure Notes (Addendum)
Procedure Name: Intubation Date/Time: 10/12/2019 1:15 PM Performed by: Molli Barrows, MD Pre-anesthesia Checklist: Patient identified, Emergency Drugs available, Suction available and Patient being monitored Patient Re-evaluated:Patient Re-evaluated prior to induction Oxygen Delivery Method: Ambu bag Preoxygenation: Pre-oxygenation with 100% oxygen Induction Type: IV induction Ventilation: Mask ventilation without difficulty Laryngoscope Size: Glidescope and 3 Tube type: Oral Number of attempts: 1 Airway Equipment and Method: Stylet and Video-laryngoscopy Placement Confirmation: ETT inserted through vocal cords under direct vision,  breath sounds checked- equal and bilateral,  CO2 detector and positive ETCO2 Secured at: 22 cm Tube secured with: Tape Dental Injury: Teeth and Oropharynx as per pre-operative assessment

## 2019-10-12 NOTE — Sepsis Progress Note (Signed)
Notified bedside nurse of need to order fluid bolus of additional  since pt is hypotensive.

## 2019-10-13 LAB — URINE CULTURE: Culture: NO GROWTH

## 2019-10-13 LAB — RESPIRATORY PANEL BY PCR

## 2019-10-13 LAB — GLUCOSE, CAPILLARY
Glucose-Capillary: 108 mg/dL — ABNORMAL HIGH (ref 70–99)
Glucose-Capillary: 119 mg/dL — ABNORMAL HIGH (ref 70–99)
Glucose-Capillary: 120 mg/dL — ABNORMAL HIGH (ref 70–99)
Glucose-Capillary: 136 mg/dL — ABNORMAL HIGH (ref 70–99)
Glucose-Capillary: 145 mg/dL — ABNORMAL HIGH (ref 70–99)
Glucose-Capillary: 153 mg/dL — ABNORMAL HIGH (ref 70–99)

## 2019-10-13 LAB — CBC WITH DIFFERENTIAL/PLATELET
Abs Immature Granulocytes: 0.06 10*3/uL (ref 0.00–0.07)
Basophils Absolute: 0 10*3/uL (ref 0.0–0.1)
Basophils Relative: 0 %
Eosinophils Absolute: 0.2 10*3/uL (ref 0.0–0.5)
Eosinophils Relative: 2 %
HCT: 34.1 % — ABNORMAL LOW (ref 36.0–46.0)
Hemoglobin: 10.6 g/dL — ABNORMAL LOW (ref 12.0–15.0)
Immature Granulocytes: 1 %
Lymphocytes Relative: 19 %
Lymphs Abs: 2.1 10*3/uL (ref 0.7–4.0)
MCH: 30.8 pg (ref 26.0–34.0)
MCHC: 31.1 g/dL (ref 30.0–36.0)
MCV: 99.1 fL (ref 80.0–100.0)
Monocytes Absolute: 0.4 10*3/uL (ref 0.1–1.0)
Monocytes Relative: 4 %
Neutro Abs: 8.3 10*3/uL — ABNORMAL HIGH (ref 1.7–7.7)
Neutrophils Relative %: 74 %
Platelets: 150 10*3/uL (ref 150–400)
RBC: 3.44 MIL/uL — ABNORMAL LOW (ref 3.87–5.11)
RDW: 13.8 % (ref 11.5–15.5)
WBC: 11 10*3/uL — ABNORMAL HIGH (ref 4.0–10.5)
nRBC: 0 % (ref 0.0–0.2)

## 2019-10-13 LAB — TRIGLYCERIDES: Triglycerides: 317 mg/dL — ABNORMAL HIGH (ref <150)

## 2019-10-13 MED ORDER — FENTANYL BOLUS VIA INFUSION
50.0000 ug | INTRAVENOUS | Status: DC | PRN
  Administered 2019-10-13 – 2019-10-15 (×12): 50 ug via INTRAVENOUS
  Filled 2019-10-13: qty 50

## 2019-10-13 NOTE — Progress Notes (Signed)
eLink Physician-Brief Progress Note Patient Name: Paula Bailey DOB: Dec 04, 1960 MRN: [id number]   Date of Service  10/13/2019  HPI/Events of Note  Agitation - Request for Fentanyl bolus from bag order.  eICU Interventions  Will order: 1. Fentanyl 50 mcg bolus from bag Q 1 hour.      Intervention Category Major Interventions: Delirium, psychosis, severe agitation - evaluation and management  Sommer,Steven Eugene 10/13/2019, 4:02 AM

## 2019-10-13 NOTE — Progress Notes (Signed)
PULMONARY / CRITICAL CARE MEDICINE   NAME:  Paula Bailey, MRN:  [id number], DOB:  September 21, 1960, LOS: 2 ADMISSION DATE:  10/11/2019, CONSULTATION DATE: 2/12 REFERRING MD:  [facility], CHIEF COMPLAINT:  Sob/ severe desats  BRIEF HISTORY:    17 yowf active cigarette smoker carries dx of copd but no pfts  on file and just on saba prn  with acute onset 3 d PTA fatigue, cough prod mucoid, and progressive doe with diffust R>L as dz on cxr/ cta with viral screens neg rx as CAP with roc/zmax and 02 but deteriorated rapidly and by afternoon 2/12 refractory to 1.00 FIO2 so PCCM consulted.  HISTORY OF PRESENT ILLNESS   Pt in resp distress so hx per [facility]: 59 y.o. female with medical history significant for hyperlipidemia, depression, COPD, anxiety and tobacco abuse who presents to the emergency department due to 3-day onset of generalized weakness, productive cough (clear sputum) and shortness of breath.  Patient sustained a fall today due to weakness but denies hitting her head, EMS was activated and arrival of EMS patient was noted to be hypoxic with an O2 sat of 86% on room air, supplemental oxygen was provided and patient was taken to ED for further evaluation and management.  She complains of reproducible chest pain which worsens with deep inspiration and cough, but she denies change in taste or smell, patient denies diarrhea, fever, chills, numbness or tingling.  ED Course: In the emergency department, she was tachycardic and tachypneic, initial BP was 94/41 but this has since improved to 101/65.  Temperature 99.51F, she was provided with supplemental oxygen at 4 LPM with O2 sat at 94-97%.  Work-up in the ED ED showed leukocytosis, hyponatremia, BNP 141, SARS-CoV 2 RNA x2 was negative, influenza A and B negative.  Chest x-ray showed widespread bilateral groundglass airspace disease consistent with multifocal pneumonia with findings being suspicious for COVID-19 pneumonia.  She was treated with IV ceftriaxone and  azithromycin.  Hospitalist was asked to admit patient for further evaluation and management.   SIGNIFICANT PAST MEDICAL HISTORY   Depression/ anxiety Hyperlipidemia COPD not confirmed with pfts baseline no 02   SIGNIFICANT EVENTS:  Rapid resp decline x first 24 h > intubated  STUDIES:   CTa chest 2/11:  1. No pulmonary embolus or acute aortic syndrome. 2. Small bilateral pleural effusions and diffuse ground-glass opacities throughout both lungs, which could indicate infection or pulmonary edema. UDS 2/12 >>>  ID studies/ CULTURES:   RVP  2/11 neg influenza/ covid 19  BC x 2 2/11 >>>  Urine 2/11 >>> RVP 2/11 neg  HIV 2/11 neg  PCT 2/11 and 2/12 see flowsheet below  MRSA PCR  2/12 neg   ANTIBIOTICS:  Rocephin  2/11 only zmax 2/11 >>> Zosyn 2/12>>>  LINES/TUBES:  CVL  2/12 >>> ET  2/12 >>>                         CONSULTANTS:  PCCM   Scheduled Meds: . budesonide (PULMICORT) nebulizer solution  0.5 mg Nebulization BID  . chlorhexidine gluconate (MEDLINE KIT)  15 mL Mouth Rinse BID  . Chlorhexidine Gluconate Cloth  6 each Topical Daily  . enoxaparin (LOVENOX) injection  40 mg Subcutaneous Q24H  . feeding supplement (PRO-STAT SUGAR FREE 64)  30 mL Per Tube Daily  . ipratropium-albuterol  3 mL Nebulization Q6H  . lidocaine  1 patch Transdermal Q24H  . mouth rinse  15 mL Mouth Rinse 10 times per day  .  nicotine  14 mg Transdermal Daily  . pantoprazole (PROTONIX) IV  40 mg Intravenous Q24H  . phosphorus  250 mg Oral BID WC   Continuous Infusions: . sodium chloride 10 mL/hr at 10/13/19 1200  . azithromycin Stopped (10/12/19 2149)  . feeding supplement (VITAL AF 1.2 CAL) 1,000 mL (10/12/19 2223)  . fentaNYL infusion INTRAVENOUS 100 mcg/hr (10/13/19 1200)  . lactated ringers 75 mL/hr at 10/13/19 1200  . norepinephrine (LEVOPHED) Adult infusion 4 mcg/min (10/13/19 1200)  . piperacillin-tazobactam (ZOSYN)  IV Stopped (10/13/19 1001)  . propofol (DIPRIVAN) infusion  Stopped (10/13/19 0821)   PRN Meds:.sodium chloride, acetaminophen **OR** acetaminophen, fentaNYL, ipratropium-albuterol, LORazepam, morphine injection   SUBJECTIVE:  sedated and intubated, PEEP 8, FiO2 0.40 I/O+ 1.6 L Propofol, fentanyl infusions Remains on norepinephrine 4   CONSTITUTIONAL: BP (!) 105/53   Pulse 85   Temp (!) 101.1 F (38.4 C)   Resp (!) 23   Ht 5' 2"  (1.575 m)   Wt 70.6 kg   SpO2 94%   BMI 28.47 kg/m   I/O last 3 completed shifts: In: 3247.9 [P.O.:240; I.V.:1956.4; NG/GT:215.4; IV Piggyback:836] Out: 2255 [Urine:2105; Emesis/NG output:150]    Intake/Output Summary (Last 24 hours) at 10/13/2019 1220 Last data filed at 10/13/2019 1200 Gross per 24 hour  Intake 3259.92 ml  Output 1365 ml  Net 1894.92 ml    CVP:  [8 mmHg-9 mmHg] 8 mmHg  Vent Mode: PSV;CPAP FiO2 (%):  [40 %-100 %] 40 % Set Rate:  [22 bmp-26 bmp] 26 bmp Vt Set:  [350 mL-400 mL] 350 mL PEEP:  [5 cmH20-10 cmH20] 5 cmH20 Pressure Support:  [10 cmH20] 10 cmH20 Plateau Pressure:  [18 cmH20-21 cmH20] 21 cmH20  PHYSICAL EXAM: General: Obese woman, laying in bed, ill-appearing Neuro: Opens eyes to voice, tracks, intermittently follows commands, moves her upper extremities HEENT: ET tube in place, no oral secretions, no tracheal secretions Cardiovascular: Regular, distant, no murmur Lungs: Bilateral inspiratory crackles, no wheezes Abdomen: Nondistended, positive bowel sounds Musculoskeletal: No deformities Skin: No rash  RESOLVED PROBLEM LIST   ASSESSMENT AND PLAN    Acute respiratory failure with bilateral pulmonary infiltrates, consistent with ARDS, possible viral pneumonia or bacterial pneumonia (although procalcitonin reassuring).  Consider cardiogenic pulmonary edema, echocardiogram reassuring 2/12  COPD, without evidence acute exacerbation or bronchospasm Continue mechanical ventilation with low tidal volumes, ARDS protocol Wean FiO2 and PEEP as able Given progress should be  able to start lightening sedation carefully Pulmonary hygiene VAP prevention orders Bronchodilators as ordered Empiric antibiotics.  Follow cultures and tailor accordingly.  Given low calcitonin may be able to narrow quickly Check RVP if not already pending  Shock, suspect due to sedating medications but also possibly septic shock Wean norepinephrine as able Follow CVP Careful with diuresis  Toxic metabolic encephalopathy due to critical illness, sedating medications Wean sedating medications as able, goal RASS -2 to -1  Hyponatremia, improved Follow BMP  Anxiety/depression Home Xanax on hold Home Zoloft and Elavil ordered    SUMMARY OF TODAY'S PLAN:    Best Practice / Goals of Care / Disposition.   DVT PROPHYLAXIS lovenox  SUP:ppi NUTRITION:npo MOBILITY:bedrest GOALS OF CARE:full cod FAMILY DISCUSSIONS: not available DISPOSITION transfer to [facility] if not able to document Covid 19 by repeat PCR pending.   LABS  Glucose Recent Labs  Lab 10/12/19 1740 10/12/19 2019 10/13/19 0002 10/13/19 0341 10/13/19 0830 10/13/19 1116  GLUCAP 132* 111* 108* 145* 120* 153*    BMET Recent Labs  Lab 10/11/19 1708 10/12/19 0350  NA 133* 139  K 4.5 4.0  CL 97* 108  CO2 27 23  BUN 17 12  CREATININE 0.94 0.69  GLUCOSE 111* 132*    Liver Enzymes Recent Labs  Lab 10/11/19 1708 10/12/19 0350  AST 38 26  ALT 28 23  ALKPHOS 60 48  BILITOT 0.8 0.6  ALBUMIN 3.6 2.8*    Electrolytes Recent Labs  Lab 10/11/19 1708 10/12/19 0350  CALCIUM 8.8* 7.6*  MG  --  1.6*  PHOS  --  2.3*    CBC Recent Labs  Lab 10/11/19 1708 10/12/19 0350 10/13/19 0556  WBC 15.7* 15.1* 11.0*  HGB 13.7 12.3 10.6*  HCT 42.6 39.3 34.1*  PLT 152 165 150    ABG Recent Labs  Lab 10/12/19 1515  PHART 7.398  PCO2ART 39.4  PO2ART 173*    Coag's Recent Labs  Lab 10/11/19 1708  APTT 28  INR 1.2    Sepsis Markers Recent Labs  Lab 10/11/19 1708 10/11/19 1837 10/12/19 0846   LATICACIDVEN 1.6  --   --   PROCALCITON  --  0.29 0.24     The patient is critically ill with multiple organ systems failure and requires high complexity decision making for assessment and support, frequent evaluation and titration of therapies, application of advanced monitoring technologies and extensive interpretation of multiple databases. Critical Care Time devoted to patient care services described in this note is 33 minutes.    Baltazar Apo, MD, PhD 10/13/2019, 12:32 PM [facility] [phone] or if no answer [phone]

## 2019-10-13 NOTE — Progress Notes (Signed)
RT note- Placed back to rest, sp02 89%, PEEP remains at +8, continue to monitor and wean as tolerated.

## 2019-10-14 ENCOUNTER — Inpatient Hospital Stay (HOSPITAL_COMMUNITY): Payer: Medicare Other

## 2019-10-14 ENCOUNTER — Encounter (HOSPITAL_COMMUNITY): Payer: Self-pay | Admitting: Internal Medicine

## 2019-10-14 LAB — BASIC METABOLIC PANEL
Anion gap: 7 (ref 5–15)
BUN: 11 mg/dL (ref 6–20)
CO2: 28 mmol/L (ref 22–32)
Calcium: 7.9 mg/dL — ABNORMAL LOW (ref 8.9–10.3)
Chloride: 107 mmol/L (ref 98–111)
Creatinine, Ser: 0.68 mg/dL (ref 0.44–1.00)
GFR calc Af Amer: >60 mL/min (ref >60)
GFR calc non Af Amer: >60 mL/min (ref >60)
Glucose, Bld: 144 mg/dL — ABNORMAL HIGH (ref 70–99)
Potassium: 3.4 mmol/L — ABNORMAL LOW (ref 3.5–5.1)
Sodium: 142 mmol/L (ref 135–145)

## 2019-10-14 LAB — GLUCOSE, CAPILLARY
Glucose-Capillary: 119 mg/dL — ABNORMAL HIGH (ref 70–99)
Glucose-Capillary: 108 mg/dL — ABNORMAL HIGH (ref 70–99)
Glucose-Capillary: 112 mg/dL — ABNORMAL HIGH (ref 70–99)

## 2019-10-14 LAB — CBC
HCT: 32.6 % — ABNORMAL LOW (ref 36.0–46.0)
Hemoglobin: 10.1 g/dL — ABNORMAL LOW (ref 12.0–15.0)
MCH: 30.8 pg (ref 26.0–34.0)
MCHC: 31 g/dL (ref 30.0–36.0)
MCV: 99.4 fL (ref 80.0–100.0)
Platelets: 166 10*3/uL (ref 150–400)
RBC: 3.28 MIL/uL — ABNORMAL LOW (ref 3.87–5.11)
RDW: 13.6 % (ref 11.5–15.5)
WBC: 8.6 10*3/uL (ref 4.0–10.5)
nRBC: 0 % (ref 0.0–0.2)

## 2019-10-14 LAB — CULTURE, RESPIRATORY W GRAM STAIN: Culture: NORMAL

## 2019-10-14 LAB — TRIGLYCERIDES: Triglycerides: 240 mg/dL — ABNORMAL HIGH (ref <150)

## 2019-10-14 LAB — LEGIONELLA PNEUMOPHILA SEROGP 1 UR AG: L. pneumophila Serogp 1 Ur Ag: NEGATIVE

## 2019-10-14 LAB — MAGNESIUM: Magnesium: 2 mg/dL (ref 1.7–2.4)

## 2019-10-14 MED ORDER — POTASSIUM CHLORIDE 20 MEQ/15ML (10%) PO SOLN
40.0000 meq | Freq: Every day | ORAL | Status: DC
  Administered 2019-10-14 – 2019-10-16 (×3): 40 meq
  Filled 2019-10-14 (×4): qty 30

## 2019-10-14 MED ORDER — POTASSIUM PHOSPHATES 15 MMOLE/5ML IV SOLN
20.0000 mmol | Freq: Once | INTRAVENOUS | Status: AC
  Administered 2019-10-14: 12:00:00 20 mmol via INTRAVENOUS
  Filled 2019-10-14: qty 6.67

## 2019-10-14 MED ORDER — AZITHROMYCIN 500 MG PO TABS
500.0000 mg | ORAL_TABLET | Freq: Once | ORAL | Status: AC
  Administered 2019-10-14: 20:00:00 500 mg via ORAL
  Filled 2019-10-14: qty 1

## 2019-10-14 NOTE — Progress Notes (Signed)
PULMONARY / CRITICAL CARE MEDICINE   NAME:  Paula Bailey, MRN:  [id number], DOB:  June 03, 1961, LOS: 3 ADMISSION DATE:  10/11/2019, CONSULTATION DATE: 2/12 REFERRING MD:  Tat/Triad, CHIEF COMPLAINT:  Sob/ severe desats  BRIEF HISTORY:    28 yowf active cigarette smoker carries dx of copd but no pfts  on file and just on saba prn  with acute onset 3 d PTA fatigue, cough prod mucoid, and progressive doe with diffust R>L as dz on cxr/ cta with viral screens neg rx as CAP with roc/zmax and 02 but deteriorated rapidly and by afternoon 2/12 refractory to 1.00 FIO2 so PCCM consulted.  HISTORY OF PRESENT ILLNESS   Pt in resp distress so hx per [facility]: 59 y.o. female with medical history significant for hyperlipidemia, depression, COPD, anxiety and tobacco abuse who presents to the emergency department due to 3-day onset of generalized weakness, productive cough (clear sputum) and shortness of breath.  Patient sustained a fall today due to weakness but denies hitting her head, EMS was activated and arrival of EMS patient was noted to be hypoxic with an O2 sat of 86% on room air, supplemental oxygen was provided and patient was taken to ED for further evaluation and management.  She complains of reproducible chest pain which worsens with deep inspiration and cough, but she denies change in taste or smell, patient denies diarrhea, fever, chills, numbness or tingling.  ED Course: In the emergency department, she was tachycardic and tachypneic, initial BP was 94/41 but this has since improved to 101/65.  Temperature 99.75F, she was provided with supplemental oxygen at 4 LPM with O2 sat at 94-97%.  Work-up in the ED ED showed leukocytosis, hyponatremia, BNP 141, SARS-CoV 2 RNA x2 was negative, influenza A and B negative.  Chest x-ray showed widespread bilateral groundglass airspace disease consistent with multifocal pneumonia with findings being suspicious for COVID-19 pneumonia.  She was treated with IV ceftriaxone and  azithromycin.  Hospitalist was asked to admit patient for further evaluation and management.   SIGNIFICANT PAST MEDICAL HISTORY   Depression/ anxiety Hyperlipidemia COPD not confirmed with pfts baseline no 02   SIGNIFICANT EVENTS:  Rapid resp decline x first 24 h > intubated  STUDIES:   CTa chest 2/11:  1. No pulmonary embolus or acute aortic syndrome. 2. Small bilateral pleural effusions and diffuse ground-glass opacities throughout both lungs, which could indicate infection or pulmonary edema. UDS 2/12 >>> opiates, benzos  ID studies/ CULTURES:   RVP  2/11 neg influenza/ covid 19  BC x 2 2/11 >>>  Urine 2/11 >>> RVP 2/13 >> negative HIV 2/11 neg  PCT 2/11 and 2/12 see flowsheet below  MRSA PCR  2/12 neg   ANTIBIOTICS:  Rocephin  2/11 only zmax 2/11 >>> Zosyn 2/12>>>  LINES/TUBES:  CVL  2/12 >>> ET  2/12 >>>                         CONSULTANTS:  PCCM  SUBJECTIVE:  Tolerating PSV with PEEP of 8 this morning.  Acute desaturation yesterday when supine/flat Remains on low-dose norepinephrine 3 Sedation lightened I/O+ 2.3 L total  CONSTITUTIONAL: BP 124/67   Pulse 95   Temp 99.9 F (37.7 C)   Resp (!) 26   Ht 5\' 2"  (1.575 m)   Wt 70.6 kg   SpO2 97%   BMI 28.47 kg/m   I/O last 3 completed shifts: In: 4564.5 [I.V.:2922.1; NG/GT:930.4; IV Piggyback:712] Out: 1790 [Urine:1790]  Intake/Output Summary (Last 24 hours) at 10/14/2019 1111 Last data filed at 10/14/2019 0600 Gross per 24 hour  Intake 2121.53 ml  Output 1175 ml  Net 946.53 ml    CVP:  [6 mmHg] 6 mmHg  Vent Mode: CPAP;PSV FiO2 (%):  [40 %] 40 % Set Rate:  [26 bmp] 26 bmp Vt Set:  [350 mL] 350 mL PEEP:  [8 cmH20] 8 cmH20 Pressure Support:  [10 cmH20] 10 cmH20 Plateau Pressure:  [18 cmH20-25 cmH20] 18 cmH20  PHYSICAL EXAM: General: Obese woman, laying in bed, ill-appearing Neuro: Opens eyes to voice, tracks, intermittently follows commands, moves her upper extremities HEENT: ET tube  in place, no oral secretions, no tracheal secretions Cardiovascular: Regular, distant, no murmur Lungs: Bilateral inspiratory crackles, no wheezes Abdomen: Nondistended, positive bowel sounds Musculoskeletal: No deformities Skin: No rash  Chest x-ray 2/14 reviewed by me, bilateral interstitial patchy infiltrates, slightly improved compared with priors   RESOLVED PROBLEM LIST   ASSESSMENT AND PLAN    Acute respiratory failure with bilateral pulmonary infiltrates, consistent with ARDS, possible viral pneumonia or bacterial pneumonia (although procalcitonin reassuring).  Consider cardiogenic pulmonary edema, echocardiogram reassuring 2/12 COPD, without evidence acute exacerbation or bronchospasm Continue mechanical ventilation with low tidal volumes, ARDS protocol Wean FiO2 and PEEP as able Continue work on spontaneous breathing trials, lightened sedation given improved gas exchange Follow chest x-ray Diuresis as she can tolerate.  Remains on low-dose norepinephrine which may be a barrier Pulmonary hygiene VAP prevention orders Bronchodilators as ordered Procalcitonin reassuring.  Plan to complete 1 more day of azithromycin, Zosyn and stop both on 2/15  Shock, suspect due to sedating medications but also possibly septic shock Continue wean norepinephrine as able.  Suspect that sedation playing a role here, working to minimize Follow CVP Careful with diuresis  Toxic metabolic encephalopathy due to critical illness, sedating medications Wean sedating medications as able, goal RASS -1 to 0  Hyponatremia, improved Follow BMP  Hypokalemia, hypophosphatemia Replace potassium phosphate  Anxiety/depression Home Xanax held Continue home Zoloft and Elavil    SUMMARY OF TODAY'S PLAN:    Best Practice / Goals of Care / Disposition.   DVT PROPHYLAXIS lovenox  SUP:ppi NUTRITION:npo MOBILITY:bedrest GOALS OF CARE:full code FAMILY DISCUSSIONS: spoke with pts sister Zella Ball 2/13,  called and left message 2/14 DISPOSITION ICU  LABS  Glucose Recent Labs  Lab 10/13/19 0341 10/13/19 0830 10/13/19 1116 10/13/19 1559 10/13/19 2330 10/14/19 0815  GLUCAP 145* 120* 153* 119* 136* 119*    BMET Recent Labs  Lab 10/11/19 1708 10/12/19 0350 10/14/19 0527  NA 133* 139 142  K 4.5 4.0 3.4*  CL 97* 108 107  CO2 27 23 28   BUN 17 12 11   CREATININE 0.94 0.69 0.68  GLUCOSE 111* 132* 144*    Liver Enzymes Recent Labs  Lab 10/11/19 1708 10/12/19 0350  AST 38 26  ALT 28 23  ALKPHOS 60 48  BILITOT 0.8 0.6  ALBUMIN 3.6 2.8*    Electrolytes Recent Labs  Lab 10/11/19 1708 10/12/19 0350 10/14/19 0527  CALCIUM 8.8* 7.6* 7.9*  MG  --  1.6* 2.0  PHOS  --  2.3*  --     CBC Recent Labs  Lab 10/12/19 0350 10/13/19 0556 10/14/19 0527  WBC 15.1* 11.0* 8.6  HGB 12.3 10.6* 10.1*  HCT 39.3 34.1* 32.6*  PLT 165 150 166    ABG Recent Labs  Lab 10/12/19 1515  PHART 7.398  PCO2ART 39.4  PO2ART 173*    Coag's Recent Labs  Lab 10/11/19 1708  APTT 28  INR 1.2    Sepsis Markers Recent Labs  Lab 10/11/19 1708 10/11/19 1837 10/12/19 0846  LATICACIDVEN 1.6  --   --   PROCALCITON  --  0.29 0.24     The patient is critically ill with multiple organ systems failure and requires high complexity decision making for assessment and support, frequent evaluation and titration of therapies, application of advanced monitoring technologies and extensive interpretation of multiple databases. Critical Care Time devoted to patient care services described in this note is 33 minutes.    Baltazar Apo, MD, PhD 10/14/2019, 11:11 AM  Pulmonary and Critical Care [phone] or if no answer [phone]

## 2019-10-15 ENCOUNTER — Inpatient Hospital Stay (HOSPITAL_COMMUNITY): Payer: Medicare Other

## 2019-10-15 DIAGNOSIS — G894 Chronic pain syndrome: Secondary | ICD-10-CM

## 2019-10-15 DIAGNOSIS — F329 Major depressive disorder, single episode, unspecified: Secondary | ICD-10-CM

## 2019-10-15 DIAGNOSIS — F419 Anxiety disorder, unspecified: Secondary | ICD-10-CM

## 2019-10-15 LAB — GLUCOSE, CAPILLARY
Glucose-Capillary: 117 mg/dL — ABNORMAL HIGH (ref 70–99)
Glucose-Capillary: 105 mg/dL — ABNORMAL HIGH (ref 70–99)
Glucose-Capillary: 140 mg/dL — ABNORMAL HIGH (ref 70–99)
Glucose-Capillary: 105 mg/dL — ABNORMAL HIGH (ref 70–99)
Glucose-Capillary: 109 mg/dL — ABNORMAL HIGH (ref 70–99)

## 2019-10-15 LAB — BASIC METABOLIC PANEL
Anion gap: 8 (ref 5–15)
BUN: 11 mg/dL (ref 6–20)
CO2: 29 mmol/L (ref 22–32)
Calcium: 8 mg/dL — ABNORMAL LOW (ref 8.9–10.3)
Chloride: 105 mmol/L (ref 98–111)
Creatinine, Ser: 0.54 mg/dL (ref 0.44–1.00)
GFR calc Af Amer: >60 mL/min (ref >60)
GFR calc non Af Amer: >60 mL/min (ref >60)
Glucose, Bld: 122 mg/dL — ABNORMAL HIGH (ref 70–99)
Potassium: 3.9 mmol/L (ref 3.5–5.1)
Sodium: 142 mmol/L (ref 135–145)

## 2019-10-15 LAB — MAGNESIUM: Magnesium: 1.8 mg/dL (ref 1.7–2.4)

## 2019-10-15 LAB — CBC
HCT: 33.3 % — ABNORMAL LOW (ref 36.0–46.0)
Hemoglobin: 10.3 g/dL — ABNORMAL LOW (ref 12.0–15.0)
MCH: 30.9 pg (ref 26.0–34.0)
MCHC: 30.9 g/dL (ref 30.0–36.0)
MCV: 100 fL (ref 80.0–100.0)
Platelets: 211 10*3/uL (ref 150–400)
RBC: 3.33 MIL/uL — ABNORMAL LOW (ref 3.87–5.11)
RDW: 13.3 % (ref 11.5–15.5)
WBC: 8.2 10*3/uL (ref 4.0–10.5)
nRBC: 0 % (ref 0.0–0.2)

## 2019-10-15 LAB — BRAIN NATRIURETIC PEPTIDE: B Natriuretic Peptide: 349 pg/mL — ABNORMAL HIGH (ref 0.0–100.0)

## 2019-10-15 MED ORDER — OXYCODONE HCL 5 MG/5ML PO SOLN
5.0000 mg | ORAL | Status: DC | PRN
  Administered 2019-10-15 – 2019-10-17 (×4): 5 mg via ORAL
  Filled 2019-10-15 (×5): qty 5

## 2019-10-15 MED ORDER — NOREPINEPHRINE 4 MG/250ML-% IV SOLN: 0.0000 ug/min | INTRAVENOUS | Status: DC

## 2019-10-15 NOTE — Progress Notes (Signed)
Primary RN notified [facility] of pt breath stacking. Primary Rn advised in report that pt is potential for extubation tomorrow and advised to not heavily sedate pt. Asked for sedation or for pt to return to pressure support mode. Elink advised to try P Support and if it didn't work to call back for further sedation orders.

## 2019-10-15 NOTE — Progress Notes (Signed)
[facility] Physician-Brief Progress Note Patient Name: Paula Bailey DOB: August 24, 1961 MRN: [id number]   Date of Service  10/15/2019  HPI/Events of Note  Bedside RN called. Pt breath stacking on rest mode. Does better on PS. As she is for extubation 2/16, will try to avoid over sedation.   eICU Interventions  Order to Place on PS mode to see if this resolves breath stacking. Call if RR > 30 sustained or if volumes are not adequate.     Intervention Category Intermediate Interventions: Respiratory distress - evaluation and management  Bevelyn Ngo 10/15/2019, 10:44 PM

## 2019-10-15 NOTE — Progress Notes (Signed)
PULMONARY / CRITICAL CARE MEDICINE   NAME:  Paula Bailey, MRN:  [id number], DOB:  October 02, 1960, LOS: 4 ADMISSION DATE:  10/11/2019, CONSULTATION DATE: 2/12 REFERRING MD:  Tat/Triad, CHIEF COMPLAINT:  Sob/ severe desats  BRIEF HISTORY:    89 yowf active cigarette smoker carries dx of copd but no pfts  on file and just on saba prn  with acute onset 3 d PTA fatigue, cough prod mucoid, and progressive doe with diffust R>L as dz on cxr/ cta with viral screens neg rx as CAP with roc/zmax and 02 but deteriorated rapidly and by afternoon 2/12 refractory to 1.00 FIO2 so PCCM consulted.  HISTORY OF PRESENT ILLNESS   Pt in resp distress so hx per [facility]: 59 y.o. female with medical history significant for hyperlipidemia, depression, COPD, anxiety and tobacco abuse who presents to the emergency department due to 3-day onset of generalized weakness, productive cough (clear sputum) and shortness of breath.  Patient sustained a fall today due to weakness but denies hitting her head, EMS was activated and arrival of EMS patient was noted to be hypoxic with an O2 sat of 86% on room air, supplemental oxygen was provided and patient was taken to ED for further evaluation and management.  She complains of reproducible chest pain which worsens with deep inspiration and cough, but she denies change in taste or smell, patient denies diarrhea, fever, chills, numbness or tingling.  ED Course: In the emergency department, she was tachycardic and tachypneic, initial BP was 94/41 but this has since improved to 101/65.  Temperature 99.28F, she was provided with supplemental oxygen at 4 LPM with O2 sat at 94-97%.  Work-up in the ED ED showed leukocytosis, hyponatremia, BNP 141, SARS-CoV 2 RNA x2 was negative, influenza A and B negative.  Chest x-ray showed widespread bilateral groundglass airspace disease consistent with multifocal pneumonia with findings being suspicious for COVID-19 pneumonia.  She was treated with IV ceftriaxone and  azithromycin.  Hospitalist was asked to admit patient for further evaluation and management.   SIGNIFICANT PAST MEDICAL HISTORY   Depression/ anxiety Hyperlipidemia COPD not confirmed with pfts baseline no 02   SIGNIFICANT EVENTS:  Rapid resp decline x first 24 h > intubated  STUDIES:   CTa chest 2/11:  1. No pulmonary embolus or acute aortic syndrome. 2. Small bilateral pleural effusions and diffuse ground-glass opacities throughout both lungs, which could indicate infection or pulmonary edema. UDS 2/12 >>> opiates, benzos  ID studies/ CULTURES:   RVP  2/11 neg influenza/ covid 19  BC x 2 2/11 >>>  Urine 2/11 >>> RVP 2/13 >> negative HIV 2/11 neg  PCT 2/11 and 2/12 see flowsheet below  MRSA PCR  2/12 neg  ANTIBIOTICS:  Rocephin  2/11 only zmax 2/11 >>> Zosyn 2/12>>>  LINES/TUBES:  CVL  2/12 >>> ET  2/12 >>>                         CONSULTANTS:  PCCM  SUBJECTIVE:  Tolerating PSV with PEEP of 8 this morning.  Acute desaturation yesterday when supine/flat Remains on low-dose norepinephrine 3 Sedation lightened I/O+ 2.3 L total  CONSTITUTIONAL: BP (!) 102/53   Pulse 78   Temp 99.7 F (37.6 C)   Resp 17   Ht 5\' 2"  (1.575 m)   Wt 70.6 kg   SpO2 94%   BMI 28.47 kg/m   I/O last 3 completed shifts: In: 4132.6 [I.V.:1740.3; NG/GT:1431.1; IV Piggyback:961.2] Out: 2645 [Urine:2645]  Intake/Output Summary (Last 24 hours) at 10/15/2019 1427 Last data filed at 10/15/2019 1400 Gross per 24 hour  Intake 2828.83 ml  Output 2030 ml  Net 798.83 ml    CVP:  [14 mmHg-16 mmHg] 14 mmHg  Vent Mode: PSV;CPAP FiO2 (%):  [40 %] 40 % Set Rate:  [26 bmp] 26 bmp Vt Set:  [350 mL] 350 mL PEEP:  [8 cmH20] 8 cmH20 Pressure Support:  [8 cmH20-10 cmH20] 8 cmH20 Plateau Pressure:  [20 ELF81-01 cmH20] 21 cmH20  PHYSICAL EXAM: General: Obese, does not appear to be in distress Neuro: Complaining of some pain HEENT: ET tube in place Cardiovascular: Regular, distant, no  murmur Lungs: Fair air entry bilaterally with some rhonchi Abdomen: Nondistended, positive bowel sounds Musculoskeletal: No deformities Skin: No rash  Chest x-ray 2/14 reviewed by myself showing patchy interstitial infiltrates   RESOLVED PROBLEM LIST   ASSESSMENT AND PLAN    Acute respiratory failure with bilateral pulmonary infiltrates, consistent with ARDS Possible viral pneumonia bacteremia -Procalcitonin is within normal limits -Patchy multifocal infiltrate is concerning -We will continue mechanical ventilation -She is tolerating weaning today with pressure support of 8 -Continue bronchodilators -Continue breathing trials -tracheal aspirate negative  Shock -Pressors being weaned -Cautious diuresis  Metabolic encephalopathy -We will reintroduce medications slowly  Electrolyte derangement -Low potassium, low phosphate -Being repleted  Anxiety/depression On Zoloft, Elavil  Reinitiate opiates-oxycodone  Goal will be to try and extubate in a.m.  SUMMARY OF TODAY'S PLAN:    Best Practice / Goals of Care / Disposition.   DVT PROPHYLAXIS lovenox  SUP:ppi NUTRITION:npo MOBILITY:bedrest GOALS OF CARE:full code FAMILY DISCUSSIONS: spoke with pts sister Paula Bailey 2/15 DISPOSITION ICU  LABS  Glucose Recent Labs  Lab 10/13/19 2330 10/14/19 0815 10/14/19 1217 10/14/19 1605 10/15/19 0821 10/15/19 1104  GLUCAP 136* 119* 108* 112* 117* 105*    BMET Recent Labs  Lab 10/12/19 0350 10/14/19 0527 10/15/19 0349  NA 139 142 142  K 4.0 3.4* 3.9  CL 108 107 105  CO2 23 28 29   BUN 12 11 11   CREATININE 0.69 0.68 0.54  GLUCOSE 132* 144* 122*    Liver Enzymes Recent Labs  Lab 10/11/19 1708 10/12/19 0350  AST 38 26  ALT 28 23  ALKPHOS 60 48  BILITOT 0.8 0.6  ALBUMIN 3.6 2.8*    Electrolytes Recent Labs  Lab 10/12/19 0350 10/14/19 0527 10/15/19 0349  CALCIUM 7.6* 7.9* 8.0*  MG 1.6* 2.0 1.8  PHOS 2.3*  --   --     CBC Recent Labs  Lab  10/13/19 0556 10/14/19 0527 10/15/19 0349  WBC 11.0* 8.6 8.2  HGB 10.6* 10.1* 10.3*  HCT 34.1* 32.6* 33.3*  PLT 150 166 211    ABG Recent Labs  Lab 10/12/19 1515  PHART 7.398  PCO2ART 39.4  PO2ART 173*    Coag's Recent Labs  Lab 10/11/19 1708  APTT 28  INR 1.2    Sepsis Markers Recent Labs  Lab 10/11/19 1708 10/11/19 1837 10/12/19 0846  LATICACIDVEN 1.6  --   --   PROCALCITON  --  0.29 0.24    The patient is critically ill with multiple organ systems failure and requires high complexity decision making for assessment and support, frequent evaluation and titration of therapies, application of advanced monitoring technologies and extensive interpretation of multiple databases. Critical Care Time devoted to patient care services described in this note independent of APP/resident time (if applicable)  is 32 minutes.   Sherrilyn Rist MD [facility] Personal pager: [phone] If unanswered, please page CCM On-call: [phone]

## 2019-10-15 NOTE — Plan of Care (Signed)
  Problem: Education: Goal: Knowledge of General Education information will improve Description Including pain rating scale, medication(s)/side effects and non-pharmacologic comfort measures Outcome: Progressing   Problem: Clinical Measurements: Goal: Respiratory complications will improve Outcome: Progressing   Problem: Coping: Goal: Level of anxiety will decrease Outcome: Progressing   Problem: Pain Managment: Goal: General experience of comfort will improve Outcome: Progressing   Problem: Safety: Goal: Ability to remain free from injury will improve Outcome: Progressing   Problem: Skin Integrity: Goal: Risk for impaired skin integrity will decrease Outcome: Progressing   

## 2019-10-15 NOTE — Progress Notes (Signed)
Patient writing this RN note to call her sister and place phone up to her ear. Call to patients sister, Zella Ball. Updated on patient condition, and placed phone to patient ear so should could hear her sister.

## 2019-10-15 NOTE — Progress Notes (Signed)
Pt placed back on full support for night time rest. 

## 2019-10-16 DIAGNOSIS — R7989 Other specified abnormal findings of blood chemistry: Secondary | ICD-10-CM

## 2019-10-16 DIAGNOSIS — J181 Lobar pneumonia, unspecified organism: Secondary | ICD-10-CM

## 2019-10-16 LAB — GLUCOSE, CAPILLARY
Glucose-Capillary: 126 mg/dL — ABNORMAL HIGH (ref 70–99)
Glucose-Capillary: 97 mg/dL (ref 70–99)
Glucose-Capillary: 108 mg/dL — ABNORMAL HIGH (ref 70–99)
Glucose-Capillary: 153 mg/dL — ABNORMAL HIGH (ref 70–99)
Glucose-Capillary: 124 mg/dL — ABNORMAL HIGH (ref 70–99)

## 2019-10-16 LAB — CULTURE, BLOOD (ROUTINE X 2)
Culture: NO GROWTH
Culture: NO GROWTH
Special Requests: ADEQUATE
Special Requests: ADEQUATE

## 2019-10-16 MED ORDER — ORAL CARE MOUTH RINSE
15.0000 mL | Freq: Two times a day (BID) | OROMUCOSAL | Status: DC
  Administered 2019-10-17 – 2019-10-19 (×2): 15 mL via OROMUCOSAL

## 2019-10-16 MED ORDER — MORPHINE BOLUS VIA INFUSION
2.0000 mg | Freq: Once | INTRAVENOUS | Status: DC
  Filled 2019-10-16: qty 2

## 2019-10-16 MED ORDER — METHYLPREDNISOLONE SODIUM SUCC 125 MG IJ SOLR
60.0000 mg | Freq: Once | INTRAMUSCULAR | Status: AC
  Administered 2019-10-16: 60 mg via INTRAVENOUS
  Filled 2019-10-16: qty 2

## 2019-10-16 MED FILL — Medication: Qty: 1 | Status: AC

## 2019-10-16 NOTE — Progress Notes (Signed)
PULMONARY / CRITICAL CARE MEDICINE   NAME:  Paula Bailey, MRN:  [id number], DOB:  [birth year], LOS: 5 ADMISSION DATE:  10/11/2019, CONSULTATION DATE: 2/12 REFERRING MD:  Tat/[facility], CHIEF COMPLAINT:  Sob/ severe desats  BRIEF HISTORY:    17 yowf active cigarette smoker carries dx of copd but no pfts  on file and just on saba prn  with acute onset 3 d PTA fatigue, cough prod mucoid, and progressive doe with diffust R>L as dz on cxr/ cta with viral screens neg rx as CAP with roc/zmax and 02 but deteriorated rapidly and by afternoon 2/12 refractory to 1.00 FIO2 so PCCM consulted.  HISTORY OF PRESENT ILLNESS   Pt in resp distress so hx per [facility]: 59 y.o. female with medical history significant for hyperlipidemia, depression, COPD, anxiety and tobacco abuse who presents to the emergency department due to 3-day onset of generalized weakness, productive cough (clear sputum) and shortness of breath.  Patient sustained a fall today due to weakness but denies hitting her head, EMS was activated and arrival of EMS patient was noted to be hypoxic with an O2 sat of 86% on room air, supplemental oxygen was provided and patient was taken to ED for further evaluation and management.  She complains of reproducible chest pain which worsens with deep inspiration and cough, but she denies change in taste or smell, patient denies diarrhea, fever, chills, numbness or tingling.  ED Course: In the emergency department, she was tachycardic and tachypneic, initial BP was 94/41 but this has since improved to 101/65.  Temperature 99.33F, she was provided with supplemental oxygen at 4 LPM with O2 sat at 94-97%.  Work-up in the ED ED showed leukocytosis, hyponatremia, BNP 141, SARS-CoV 2 RNA x2 was negative, influenza A and B negative.  Chest x-ray showed widespread bilateral groundglass airspace disease consistent with multifocal pneumonia with findings being suspicious for COVID-19 pneumonia.  She was treated with IV ceftriaxone and  azithromycin.  Hospitalist was asked to admit patient for further evaluation and management.   SIGNIFICANT PAST MEDICAL HISTORY   Depression/ anxiety Hyperlipidemia COPD not confirmed with pfts baseline no 02   SIGNIFICANT EVENTS:  Rapid resp decline x first 24 h > intubated  STUDIES:   CTa chest 2/11:  1. No pulmonary embolus or acute aortic syndrome. 2. Small bilateral pleural effusions and diffuse ground-glass opacities throughout both lungs, which could indicate infection or pulmonary edema. UDS 2/12 >>> opiates, benzos  ID studies/ CULTURES:   RVP  2/11 neg influenza/ covid 19  BC x 2 2/11 >>>  Urine 2/11 >>> RVP 2/13 >> negative HIV 2/11 neg  PCT 2/11 and 2/12 see flowsheet below  MRSA PCR  2/12 neg  ANTIBIOTICS:  Rocephin  2/11 only zmax 2/11 >>> Zosyn 2/12>>>  LINES/TUBES:  CVL  2/12 >>> ET  2/12 >>>                         CONSULTANTS:  PCCM  SUBJECTIVE:  Tolerating PSV with PEEP of 8 this morning.  Acute desaturation yesterday when supine/flat Remains on low-dose norepinephrine 3 Sedation lightened I/O+ 2.3 L total  CONSTITUTIONAL: BP 132/66   Pulse 86   Temp 98.7 F (37.1 C) (Oral)   Resp (!) 23   Ht 5\' 2"  (1.575 m)   Wt 70.6 kg   SpO2 93%   BMI 28.47 kg/m   I/O last 3 completed shifts: In: 3202.9 [I.V.:1211.5; NG/GT:1941.3; IV Piggyback:50.2] Out: 2355 [Urine:2355]  Intake/Output Summary (Last 24 hours) at 10/16/2019 1536 Last data filed at 10/16/2019 1200 Gross per 24 hour  Intake 1038.11 ml  Output 550 ml  Net 488.11 ml    CVP:  [8 mmHg-9 mmHg] 9 mmHg  Vent Mode: PSV;CPAP FiO2 (%):  [40 %] 40 % Set Rate:  [26 bmp] 26 bmp Vt Set:  [350 mL] 350 mL PEEP:  [5 cmH20-8 cmH20] 5 cmH20 Pressure Support:  [5 cmH20-8 cmH20] 5 cmH20 Plateau Pressure:  [19 cmH20] 19 cmH20  PHYSICAL EXAM: General: Obese, not in distress Neuro: Complaining of some pain HEENT: ET tube in place Cardiovascular: Regular, distant heart sounds Lungs:  Minimal rhonchi bilaterally Abdomen: Nondistended, positive bowel sounds Musculoskeletal: No deformities Skin: No rash  Chest x-ray 2/15 reviewed by myself showing patchy interstitial infiltrates-unchanged from previous   RESOLVED PROBLEM LIST   ASSESSMENT AND PLAN    Acute respiratory failure with bilateral pulmonary infiltrates, consistent with ARDS Possible viral pneumonia bacteremia Procalcitonin is within normal limits Patchy multifocal infiltrates concerning Continue mechanical ventilation  Plan will be to extubate today Continue bronchodilators Tracheal aspirate negative  Shock Cautious diuresis  Metabolic encephalopathy -We will reintroduce medications slowly  Electrolyte derangement Electrolytes corrected Anxiety/depression On Zoloft, Elavil  Reinitiate opiates-oxycodone  Patient was extubated this afternoon and is doing well at present  SUMMARY OF TODAY'S PLAN:    Best Practice / Goals of Care / Disposition.   DVT PROPHYLAXIS lovenox  SUP:ppi NUTRITION:npo MOBILITY:bedrest GOALS OF CARE:full code FAMILY DISCUSSIONS: spoke with pts sister Shirlean Mylar 2/16 DISPOSITION ICU  LABS  Glucose Recent Labs  Lab 10/15/19 1621 10/15/19 2005 10/15/19 2326 10/16/19 0353 10/16/19 0814 10/16/19 1147  GLUCAP 140* 105* 109* 126* 97 108*    BMET Recent Labs  Lab 10/12/19 0350 10/14/19 0527 10/15/19 0349  NA 139 142 142  K 4.0 3.4* 3.9  CL 108 107 105  CO2 23 28 29   BUN 12 11 11   CREATININE 0.69 0.68 0.54  GLUCOSE 132* 144* 122*    Liver Enzymes Recent Labs  Lab 10/11/19 1708 10/12/19 0350  AST 38 26  ALT 28 23  ALKPHOS 60 48  BILITOT 0.8 0.6  ALBUMIN 3.6 2.8*    Electrolytes Recent Labs  Lab 10/12/19 0350 10/14/19 0527 10/15/19 0349  CALCIUM 7.6* 7.9* 8.0*  MG 1.6* 2.0 1.8  PHOS 2.3*  --   --     CBC Recent Labs  Lab 10/13/19 0556 10/14/19 0527 10/15/19 0349  WBC 11.0* 8.6 8.2  HGB 10.6* 10.1* 10.3*  HCT 34.1* 32.6* 33.3*   PLT 150 166 211    ABG Recent Labs  Lab 10/12/19 1515  PHART 7.398  PCO2ART 39.4  PO2ART 173*    Coag's Recent Labs  Lab 10/11/19 1708  APTT 28  INR 1.2    Sepsis Markers Recent Labs  Lab 10/11/19 1708 10/11/19 1837 10/12/19 0846  LATICACIDVEN 1.6  --   --   PROCALCITON  --  0.29 0.24   The patient is critically ill with multiple organ systems failure and requires high complexity decision making for assessment and support, frequent evaluation and titration of therapies, application of advanced monitoring technologies and extensive interpretation of multiple databases. Critical Care Time devoted to patient care services described in this note independent of APP/resident time (if applicable)  is 30 minutes.   Sherrilyn Rist MD [facility] Personal pager: [phone] If unanswered, please page CCM On-call: [phone]

## 2019-10-16 NOTE — Procedures (Signed)
Extubation Procedure Note  Patient Details:   Name: Paula Bailey DOB: 09-14-1960 MRN: [id number]   Airway Documentation:    Vent end date: 10/12/19 Vent end time: 1630   Evaluation  O2 sats: Pt SATs dropped to 83% after extubation while vomiting. NRB placed on pt, SATs increased to 98%. Placed 6L [place] on pt. SATs maintaining 90-93%. Complications: Complications of Vomiting. Patient did tolerate procedure well. Bilateral Breath Sounds: Clear, Diminished   Yes   Pt extubated per MD order. No cuff leak heard prior to IV steroids administered, small cuff leak heard by RN and RT 15 minutes after administered. Pt able to cough well and speak her name. Pt is A&Ox4 on 6L [place]. NRB temporarily used to recover SATs while vomiting. Pt is stable at this time, no further apparent complications. No stridor heard.    Dewain Penning T 10/16/2019, 11:22 AM

## 2019-10-17 LAB — CBC
HCT: 36.8 % (ref 36.0–46.0)
Hemoglobin: 11.7 g/dL — ABNORMAL LOW (ref 12.0–15.0)
MCH: 30.5 pg (ref 26.0–34.0)
MCHC: 31.8 g/dL (ref 30.0–36.0)
MCV: 95.8 fL (ref 80.0–100.0)
Platelets: 314 10*3/uL (ref 150–400)
RBC: 3.84 MIL/uL — ABNORMAL LOW (ref 3.87–5.11)
RDW: 12.8 % (ref 11.5–15.5)
WBC: 11.4 10*3/uL — ABNORMAL HIGH (ref 4.0–10.5)
nRBC: 0 % (ref 0.0–0.2)

## 2019-10-17 LAB — GLUCOSE, CAPILLARY
Glucose-Capillary: 103 mg/dL — ABNORMAL HIGH (ref 70–99)
Glucose-Capillary: 132 mg/dL — ABNORMAL HIGH (ref 70–99)
Glucose-Capillary: 75 mg/dL (ref 70–99)
Glucose-Capillary: 76 mg/dL (ref 70–99)
Glucose-Capillary: 86 mg/dL (ref 70–99)
Glucose-Capillary: 97 mg/dL (ref 70–99)

## 2019-10-17 LAB — BASIC METABOLIC PANEL
Anion gap: 13 (ref 5–15)
BUN: 18 mg/dL (ref 6–20)
CO2: 27 mmol/L (ref 22–32)
Calcium: 9.2 mg/dL (ref 8.9–10.3)
Chloride: 100 mmol/L (ref 98–111)
Creatinine, Ser: 0.5 mg/dL (ref 0.44–1.00)
GFR calc Af Amer: >60 mL/min (ref >60)
GFR calc non Af Amer: >60 mL/min (ref >60)
Glucose, Bld: 106 mg/dL — ABNORMAL HIGH (ref 70–99)
Potassium: 4.1 mmol/L (ref 3.5–5.1)
Sodium: 140 mmol/L (ref 135–145)

## 2019-10-17 LAB — MAGNESIUM: Magnesium: 2.1 mg/dL (ref 1.7–2.4)

## 2019-10-17 MED ORDER — FUROSEMIDE 40 MG PO TABS
40.0000 mg | ORAL_TABLET | Freq: Every day | ORAL | Status: AC
  Administered 2019-10-17 – 2019-10-19 (×3): 40 mg via ORAL
  Filled 2019-10-17 (×3): qty 1

## 2019-10-17 MED ORDER — POTASSIUM CHLORIDE 20 MEQ/15ML (10%) PO SOLN
40.0000 meq | Freq: Every day | ORAL | Status: DC
  Administered 2019-10-18 – 2019-10-19 (×2): 40 meq via ORAL
  Filled 2019-10-17 (×2): qty 30

## 2019-10-17 MED ORDER — ENSURE ENLIVE PO LIQD
237.0000 mL | Freq: Two times a day (BID) | ORAL | Status: DC
  Administered 2019-10-17 – 2019-10-18 (×4): 237 mL via ORAL

## 2019-10-17 MED ORDER — IPRATROPIUM-ALBUTEROL 0.5-2.5 (3) MG/3ML IN SOLN
3.0000 mL | Freq: Three times a day (TID) | RESPIRATORY_TRACT | Status: DC
  Administered 2019-10-17: 09:00:00 3 mL via RESPIRATORY_TRACT
  Filled 2019-10-17: qty 3

## 2019-10-17 MED ORDER — FLUTICASONE FUROATE-VILANTEROL 200-25 MCG/INH IN AEPB
1.0000 | INHALATION_SPRAY | Freq: Every day | RESPIRATORY_TRACT | Status: DC
  Filled 2019-10-17: qty 28

## 2019-10-17 MED ORDER — OXYCODONE-ACETAMINOPHEN 7.5-325 MG PO TABS
1.0000 | ORAL_TABLET | Freq: Four times a day (QID) | ORAL | Status: DC | PRN
  Administered 2019-10-17 – 2019-10-19 (×5): 1 via ORAL
  Filled 2019-10-17 (×5): qty 1

## 2019-10-17 MED ORDER — GABAPENTIN 300 MG PO CAPS
300.0000 mg | ORAL_CAPSULE | Freq: Three times a day (TID) | ORAL | Status: DC
  Administered 2019-10-17 – 2019-10-19 (×7): 300 mg via ORAL
  Filled 2019-10-17 (×7): qty 1

## 2019-10-17 MED ORDER — PANTOPRAZOLE SODIUM 40 MG PO TBEC
40.0000 mg | DELAYED_RELEASE_TABLET | Freq: Every day | ORAL | Status: DC
  Administered 2019-10-18 – 2019-10-19 (×2): 40 mg via ORAL
  Filled 2019-10-17 (×2): qty 1

## 2019-10-17 MED ORDER — PREDNISONE 20 MG PO TABS
20.0000 mg | ORAL_TABLET | Freq: Every day | ORAL | Status: AC
  Administered 2019-10-18 – 2019-10-19 (×2): 20 mg via ORAL
  Filled 2019-10-17 (×3): qty 1

## 2019-10-17 MED ORDER — UMECLIDINIUM BROMIDE 62.5 MCG/INH IN AEPB
1.0000 | INHALATION_SPRAY | Freq: Every day | RESPIRATORY_TRACT | Status: DC
  Filled 2019-10-17: qty 7

## 2019-10-17 MED ORDER — ALPRAZOLAM 0.25 MG PO TABS
0.2500 mg | ORAL_TABLET | Freq: Once | ORAL | Status: AC
  Administered 2019-10-17: 0.25 mg via ORAL
  Filled 2019-10-17: qty 1

## 2019-10-17 NOTE — Progress Notes (Signed)
Nutrition Follow-up  DOCUMENTATION CODES:   Not applicable  INTERVENTION:   Advance diet to regular.  Ensure Enlive po BID, each supplement provides 350 kcal and 20 grams of protein  NUTRITION DIAGNOSIS:   Inadequate oral intake related to inability to eat as evidenced by NPO status. (diet just advanced)  Ongoing  GOAL:   Patient will meet greater than or equal to 90% of their needs  Progressing  MONITOR:   PO intake, Supplement acceptance  ASSESSMENT:   59 year old female with past medical history of HLD, COPD, depression and anxiety presented with 3 day history of generalized weakness, productive cough of clear sputum, and shortness of breath and reports reproducible chest pain which worsens with deep inspiration and cough. In ED, CXR consist with multifocal pneumonia.  Extubated 2/16. Diet advanced to clear liquids after extubation. Patient says she is ready for solid food.  Discussed with CCM, okay to advance to regular diet.  Labs reviewed.  CBG's: [phone]  Medications reviewed and include Lasix, KCl, Prednisone.   NUTRITION - FOCUSED PHYSICAL EXAM:    Most Recent Value  Orbital Region  No depletion  Upper Arm Region  No depletion  Thoracic and Lumbar Region  No depletion  Buccal Region  No depletion  Temple Region  No depletion  Clavicle Bone Region  No depletion  Clavicle and Acromion Bone Region  No depletion  Scapular Bone Region  No depletion  Dorsal Hand  No depletion  Patellar Region  No depletion  Anterior Thigh Region  No depletion  Posterior Calf Region  No depletion  Edema (RD Assessment)  None  Hair  Reviewed  Eyes  Reviewed  Mouth  Reviewed  Skin  Reviewed  Nails  Reviewed       Diet Order:   Diet Order            Diet regular Room service appropriate? Yes; Fluid consistency: Thin  Diet effective now              EDUCATION NEEDS:   Not appropriate for education at this time  Skin:  Skin Assessment: Reviewed RN  Assessment  Last BM:  2/15 type 5  Height:   Ht Readings from Last 1 Encounters:  10/12/19 5\' 2"  (1.575 m)    Weight:   Wt Readings from Last 1 Encounters:  10/13/19 70.6 kg    Ideal Body Weight:  50 kg  BMI:  Body mass index is 28.47 kg/m.  Estimated Nutritional Needs:   Kcal:  1600-1800  Protein:  80-90 gm  Fluid:  >/= 1.6 L    10/15/19, RD, LDN, CNSC Please refer to Amion for contact information.

## 2019-10-17 NOTE — Progress Notes (Signed)
PULMONARY / CRITICAL CARE MEDICINE   NAME:  Paula Bailey, MRN:  [id number], DOB:  Nov 13, 1960, LOS: 6 ADMISSION DATE:  10/11/2019, CONSULTATION DATE: 2/12 REFERRING MD:  Tat/Triad, CHIEF COMPLAINT:  Sob/ severe desats  BRIEF HISTORY:    59 yowf active cigarette smoker carries dx of copd but no pfts  on file and just on saba prn  with acute onset 3 d PTA fatigue, cough prod mucoid, and progressive doe with diffust R>L as dz on cxr/ cta with viral screens neg rx as CAP with roc/zmax and 02 but deteriorated rapidly and by afternoon 2/12 refractory to 1.00 FIO2 so PCCM consulted.  HISTORY OF PRESENT ILLNESS   Pt in resp distress so hx per Triad: 59 y.o. female with medical history significant for hyperlipidemia, depression, COPD, anxiety and tobacco abuse who presents to the emergency department due to 3-day onset of generalized weakness, productive cough (clear sputum) and shortness of breath.  Patient sustained a fall today due to weakness but denies hitting her head, EMS was activated and arrival of EMS patient was noted to be hypoxic with an O2 sat of 86% on room air, supplemental oxygen was provided and patient was taken to ED for further evaluation and management.  She complains of reproducible chest pain which worsens with deep inspiration and cough, but she denies change in taste or smell, patient denies diarrhea, fever, chills, numbness or tingling.  ED Course: In the emergency department, she was tachycardic and tachypneic, initial BP was 94/41 but this has since improved to 101/65.  Temperature 99.15F, she was provided with supplemental oxygen at 4 LPM with O2 sat at 94-97%.  Work-up in the ED ED showed leukocytosis, hyponatremia, BNP 141, SARS-CoV 2 RNA x2 was negative, influenza A and B negative.  Chest x-ray showed widespread bilateral groundglass airspace disease consistent with multifocal pneumonia with findings being suspicious for COVID-19 pneumonia.  She was treated with IV ceftriaxone and  azithromycin.  Hospitalist was asked to admit patient for further evaluation and management.   SIGNIFICANT PAST MEDICAL HISTORY   Depression/ anxiety Hyperlipidemia COPD not confirmed with pfts baseline no 02   SIGNIFICANT EVENTS:  Rapid resp decline x first 24 h > intubated  STUDIES:   CTa chest 2/11:  1. No pulmonary embolus or acute aortic syndrome. 2. Small bilateral pleural effusions and diffuse ground-glass opacities throughout both lungs, which could indicate infection or pulmonary edema. UDS 2/12 >>> opiates, benzos  ID studies/ CULTURES:   RVP  2/11 neg influenza/ covid 19  BC x 2 2/11 >>>  Urine 2/11 >>> RVP 2/13 >> negative HIV 2/11 neg  PCT 2/11 and 2/12 see flowsheet below  MRSA PCR  2/12 neg  ANTIBIOTICS:  Rocephin  2/11 only zmax 2/11 >>> Zosyn 2/12>>>  LINES/TUBES:  CVL  2/12 >>> ET  2/12 >>>                         CONSULTANTS:  PCCM  SUBJECTIVE:  Wants to go home. Perseverating about her home pain and anxiety meds.  CONSTITUTIONAL: BP (!) 108/57   Pulse 71   Temp (!) 97.5 F (36.4 C) (Oral)   Resp 17   Ht 5\' 2"  (1.575 m)   Wt 70.6 kg   SpO2 95%   BMI 28.47 kg/m   I/O last 3 completed shifts: In: 943.7 [I.V.:407.5; NG/GT:536.3] Out: 3050 [Urine:3050]    Intake/Output Summary (Last 24 hours) at 10/17/2019 1132 Last data filed at 10/17/2019  1100 Gross per 24 hour  Intake 239.29 ml  Output 1750 ml  Net -1510.71 ml    CVP:  [9 mmHg] 9 mmHg     PHYSICAL EXAM: GEN: 59 year old woman in NAD HEENT: MMM, no thrush CV: RRR, ext warm PULM: Wheezing bilaterally, no accessory muscle use GI: Soft, +BS EXT: No edema NEURO: Moves all 4 ext to command PSYCH: Anxious SKIN: No rashes    RESOLVED PROBLEM LIST   ASSESSMENT AND PLAN   ARDS- my suspicion here is for an aspiration event.  Rapid improvement. - Weaned to RA - IS - Given negative infectious workup and wheezing on exam, will give prednisone for a couple weeks -  Negotiated patient to stay in the hospital another day or two for PT evaluation and strengthening exercises - Updated sister at bedside  COPD- needs to establish care with pulmonologist - DC nebs, start incruse/breo  Chronic pain/anxiety- restart home meds, gabapentin at half dose  Smoking- nicotine patch, needs to stop  Transfer to floor, pulm will remain primary.  Erskine Emery MD PCCM

## 2019-10-17 NOTE — Progress Notes (Signed)
eLink Physician-Brief Progress Note Patient Name: Paula Bailey DOB: 1960/10/09 MRN: [id number]   Date of Service  10/17/2019  HPI/Events of Note  Pt needs a.m. labs  eICU Interventions  CBC, BMET, Mg++ ordered.        Thomasene Lot Jericho Alcorn 10/17/2019, 3:56 AM

## 2019-10-18 ENCOUNTER — Telehealth: Payer: Self-pay | Admitting: Internal Medicine

## 2019-10-18 DIAGNOSIS — F17200 Nicotine dependence, unspecified, uncomplicated: Secondary | ICD-10-CM

## 2019-10-18 DIAGNOSIS — J80 Acute respiratory distress syndrome: Secondary | ICD-10-CM

## 2019-10-18 DIAGNOSIS — J410 Simple chronic bronchitis: Secondary | ICD-10-CM

## 2019-10-18 LAB — CREATININE, SERUM
Creatinine, Ser: 0.6 mg/dL (ref 0.44–1.00)
GFR calc Af Amer: >60 mL/min (ref >60)
GFR calc non Af Amer: >60 mL/min (ref >60)

## 2019-10-18 MED ORDER — SERTRALINE HCL 100 MG PO TABS
150.0000 mg | ORAL_TABLET | Freq: Every day | ORAL | Status: DC
  Administered 2019-10-18: 150 mg via ORAL
  Filled 2019-10-18: qty 2

## 2019-10-18 MED ORDER — ALBUMIN HUMAN 5 % IV SOLN
12.5000 g | Freq: Once | INTRAVENOUS | Status: AC
  Administered 2019-10-18: 02:00:00 12.5 g via INTRAVENOUS
  Filled 2019-10-18: qty 250

## 2019-10-18 MED ORDER — AMITRIPTYLINE HCL 10 MG PO TABS
20.0000 mg | ORAL_TABLET | Freq: Every day | ORAL | Status: DC
  Administered 2019-10-18: 20 mg via ORAL
  Filled 2019-10-18: qty 2

## 2019-10-18 NOTE — Progress Notes (Signed)
Ne wIV to right inner forearm placed by Kathyrn Sheriff RN. 1 attempt. Pt tolerated well. 10/17/2019 2325 Manson Allan, RN

## 2019-10-18 NOTE — Progress Notes (Addendum)
Meredith Pel MD to inform that patient's bp was soft.  87/43 and 89/53. MD to place orders for care. 10/17/2019 2300 Manson Allan, RN

## 2019-10-18 NOTE — Evaluation (Signed)
Physical Therapy Evaluation Patient Details Name: Paula Bailey MRN: [id number] DOB: 1961-05-03 Today's Date: 10/18/2019   History of Present Illness  Pt is 59 yo female with hx of COPD, depression/anxiety, Hyperlipidemia.  She continues to smoke cigarettes.  Pt was admitted with ARDS and intubated 10/12/19/ and extubated 10/16/19.  Pt now on RA.  Clinical Impression  Pt admitted with above diagnosis. Pt was able to ambulate 200' without AD but with cautious gait pattern.  Pt's O2 sats maintained 96% on RA.  Pt with mild unsteadiness and decreased safety awareness.  Pt reporting will have 24 hr assist initially.  Pt currently with functional limitations due to the deficits listed below (see PT Problem List). Pt will benefit from skilled PT to increase their independence and safety with mobility to allow discharge to the venue listed below.       Follow Up Recommendations Home health PT;Supervision for mobility/OOB    Equipment Recommendations  None recommended by PT    Recommendations for Other Services       Precautions / Restrictions Precautions Precautions: Fall      Mobility  Bed Mobility Overal bed mobility: Needs Assistance Bed Mobility: Supine to Sit     Supine to sit: Modified independent (Device/Increase time)     General bed mobility comments: increased effort and HOB elevated  Transfers Overall transfer level: Needs assistance Equipment used: None Transfers: Sit to/from Stand Sit to Stand: Min guard         General transfer comment: min guard for safety  Ambulation/Gait Ambulation/Gait assistance: Min guard;Supervision Gait Distance (Feet): 200 Feet Assistive device: None Gait Pattern/deviations: Decreased stride length Gait velocity: decreased   General Gait Details: stiff gait pattern; initial min guard progressed to close SBA; no overt LOB but mild unsteadiness  Stairs Stairs: Yes Stairs assistance: Min guard Stair Management: One rail Right;Step  to pattern Number of Stairs: 5    Wheelchair Mobility    Modified Rankin (Stroke Patients Only)       Balance Overall balance assessment: Needs assistance Sitting-balance support: No upper extremity supported;Feet supported Sitting balance-Leahy Scale: Good     Standing balance support: During functional activity;No upper extremity supported Standing balance-Leahy Scale: Fair                   Standardized Balance Assessment Standardized Balance Assessment : Dynamic Gait Index   Dynamic Gait Index Level Surface: Mild Impairment Change in Gait Speed: Mild Impairment Gait with Horizontal Head Turns: Mild Impairment Gait with Vertical Head Turns: Mild Impairment Gait and Pivot Turn: Mild Impairment Step Over Obstacle: Moderate Impairment Step Around Obstacles: Mild Impairment Steps: Moderate Impairment Total Score: 14       Pertinent Vitals/Pain Pain Assessment: 0-10 Pain Score: 6  Pain Location: back Pain Descriptors / Indicators: Aching(chronic) Pain Intervention(s): Limited activity within patient's tolerance;Monitored during session;Repositioned   Pt was on RA at rest with sats 96%.  With ambulation and stairs on RA sats maintained 96%.    Home Living Family/patient expects to be discharged to:: Private residence Living Arrangements: Other relatives Available Help at Discharge: Family;Available PRN/intermittently Type of Home: House Home Access: Level entry     Home Layout: One level Home Equipment: None      Prior Function Level of Independence: Independent         Comments: Reports  independent with all ADLs, IADLs, driving, and community ambulation.  Pt does not works -has disability.     Hand Dominance  Extremity/Trunk Assessment   Upper Extremity Assessment Upper Extremity Assessment: Overall WFL for tasks assessed    Lower Extremity Assessment Lower Extremity Assessment: Overall WFL for tasks assessed    Cervical /  Trunk Assessment Cervical / Trunk Assessment: Normal  Communication   Communication: No difficulties  Cognition Arousal/Alertness: Awake/alert Behavior During Therapy: WFL for tasks assessed/performed Overall Cognitive Status: No family/caregiver present to determine baseline cognitive functioning Area of Impairment: Following commands;Safety/judgement;Awareness                       Following Commands: Follows multi-step commands inconsistently;Follows one step commands consistently Safety/Judgement: Decreased awareness of deficits     General Comments: Pt alert and oriented but did repeat self at times.      General Comments      Exercises     Assessment/Plan    PT Assessment Patient needs continued PT services  PT Problem List Decreased safety awareness;Decreased coordination;Decreased mobility;Decreased activity tolerance;Decreased balance;Decreased knowledge of use of DME;Decreased cognition       PT Treatment Interventions DME instruction;Therapeutic activities;Gait training;Therapeutic exercise;Patient/family education;Stair training;Balance training;Functional mobility training    PT Goals (Current goals can be found in the Care Plan section)  Acute Rehab PT Goals Patient Stated Goal: return home with sister - reports daughter has taken some time off to stay with her PT Goal Formulation: With patient Time For Goal Achievement: 11/01/19 Potential to Achieve Goals: Good    Frequency Min 3X/week   Barriers to discharge        Co-evaluation               AM-PAC PT "6 Clicks" Mobility  Outcome Measure Help needed turning from your back to your side while in a flat bed without using bedrails?: None Help needed moving from lying on your back to sitting on the side of a flat bed without using bedrails?: None Help needed moving to and from a bed to a chair (including a wheelchair)?: None Help needed standing up from a chair using your arms (e.g.,  wheelchair or bedside chair)?: None Help needed to walk in hospital room?: None Help needed climbing 3-5 steps with a railing? : A Little 6 Click Score: 23    End of Session Equipment Utilized During Treatment: Gait belt Activity Tolerance: Patient tolerated treatment well Patient left: in bed;with call bell/phone within reach;with bed alarm set Nurse Communication: Mobility status PT Visit Diagnosis: Unsteadiness on feet (R26.81)    Time: 8786-7672 PT Time Calculation (min) (ACUTE ONLY): 33 min   Charges:   PT Evaluation $PT Eval Moderate Complexity: 1 Mod          Royetta Asal, PT Acute Rehab Services Pager [phone] [facility] [phone] [facility] [phone]   Rayetta Humphrey 10/18/2019, 4:38 PM

## 2019-10-18 NOTE — Telephone Encounter (Signed)
Needs hospital follow up for COPD with myself in 2-4 weeks.

## 2019-10-18 NOTE — Progress Notes (Signed)
eLink Physician-Brief Progress Note Patient Name: Paula Bailey DOB: Mar 17, 1961 MRN: [id number]   Date of Service  10/18/2019  HPI/Events of Note  SBP in the upper 80's, MAP 57 mmHg  eICU Interventions  Albumin 5 % 250 ml iv fluid bolus over 1 hour after starting an iv.        Thomasene Lot [name] 10/18/2019, 12:13 AM

## 2019-10-18 NOTE — Progress Notes (Signed)
PULMONARY / CRITICAL CARE MEDICINE   NAME:  Paula Bailey, MRN:  [id number], DOB:  1961-07-03, LOS: 7 ADMISSION DATE:  10/11/2019, CONSULTATION DATE: 2/12 REFERRING MD:  Tat/Triad, CHIEF COMPLAINT:  Sob/ severe desats  BRIEF HISTORY:    15 yowf active cigarette smoker carries dx of copd but no pfts  on file and just on saba prn  with acute onset 3 d PTA fatigue, cough prod mucoid, and progressive doe with diffust R>L as dz on cxr/ cta with viral screens neg rx as CAP with roc/zmax and 02 but deteriorated rapidly and by afternoon 2/12 refractory to 1.00 FIO2 so PCCM consulted.  SIGNIFICANT PAST MEDICAL HISTORY   Depression/ anxiety Hyperlipidemia COPD not confirmed with pfts baseline no 02   SIGNIFICANT EVENTS:  Rapid resp decline x first 24 h > intubated  STUDIES:   CTa chest 2/11:  1. No pulmonary embolus or acute aortic syndrome. 2. Small bilateral pleural effusions and diffuse ground-glass opacities throughout both lungs, which could indicate infection or pulmonary edema. UDS 2/12 >>> opiates, benzos  ID studies/ CULTURES:   RVP  2/11 neg influenza/ covid 19  BC x 2 2/11 >>>  Urine 2/11 >>> RVP 2/13 >> negative HIV 2/11 neg  PCT 2/11 and 2/12 see flowsheet below  MRSA PCR  2/12 neg  ANTIBIOTICS:  Rocephin  2/11 only zmax 2/11 >>> Zosyn 2/12>>>  LINES/TUBES:  CVL  2/12 >>>2/16 ET  2/12 >>> 2/16                         CONSULTANTS:  PCCM  SUBJECTIVE:  Wants to go home. Somewhat hypotensive overnight responded to IVF and Albumin. This morning feels much better and is ambulating with walker.    CONSTITUTIONAL: BP (!) 113/56 (BP Location: Left Arm)   Pulse 62   Temp 97.7 F (36.5 C) (Oral)   Resp 15   Ht 5\' 2"  (1.575 m)   Wt 70.6 kg   SpO2 99%   BMI 28.47 kg/m   I/O last 3 completed shifts: In: 1019.3 [P.O.:360; I.V.:409.3; IV Piggyback:250] Out: 1650 [Urine:1650]    Intake/Output Summary (Last 24 hours) at 10/18/2019 1017 Last data filed at 10/18/2019  0720 Gross per 24 hour  Intake 1110 ml  Output 950 ml  Net 160 ml          PHYSICAL EXAM: GEN: middle aged female in NAD HEENT: [place]/AT, PERRL, MMM, no thrush CV: RRR, no edema.  PULM: Scant wheeze. Breathing comfortably.  GI: Soft, +BS EXT: No acute deformity or ROM limitation.  NEURO: Moves all 4 ext to command SKIN: No rashes   RESOLVED PROBLEM LIST   ASSESSMENT AND PLAN   ARDS- my suspicion here is for an aspiration event.  Rapid improvement. - Wean FiO2 to room air - Walking oximetry to determine home O2 needs.  - IS - Given negative infectious workup and wheezing on exam, will plan for prednisone for a couple weeks - Willing to remain inpatient for PT evaluation. She is hopeful for home health PTT recommendation.  - Oral diuretic lasix  COPD- needs to establish care with pulmonologist - Continue Incruse/Breo  Chronic pain/anxiety - resume home amitriptyline, sertraline, gabapentin (half dose), xanax  Smoking- nicotine patch, needs to stop  Plan for discharge in the next 24 hours   10/20/2019, AGACNP-BC [facility]  See Amion for personal pager PCCM on call pager [phone]  10/18/2019 10:24 AM

## 2019-10-19 ENCOUNTER — Encounter (HOSPITAL_COMMUNITY): Payer: Self-pay

## 2019-10-19 ENCOUNTER — Encounter (HOSPITAL_COMMUNITY): Payer: Self-pay | Admitting: Surgery

## 2019-10-19 DIAGNOSIS — R6521 Severe sepsis with septic shock: Secondary | ICD-10-CM

## 2019-10-19 MED ORDER — UMECLIDINIUM BROMIDE 62.5 MCG/INH IN AEPB: 1.0000 | INHALATION_SPRAY | Freq: Every day | RESPIRATORY_TRACT | 2 refills | Status: DC

## 2019-10-19 MED ORDER — FLUTICASONE FUROATE-VILANTEROL 200-25 MCG/INH IN AEPB: 1.0000 | INHALATION_SPRAY | Freq: Every day | RESPIRATORY_TRACT | 2 refills | Status: DC

## 2019-10-19 NOTE — Plan of Care (Signed)
  Problem: Education: Goal: Knowledge of General Education information will improve Description: Including pain rating scale, medication(s)/side effects and non-pharmacologic comfort measures Outcome: Progressing   Problem: Clinical Measurements: Goal: Will remain free from infection Outcome: Progressing   Problem: Nutrition: Goal: Adequate nutrition will be maintained Outcome: Progressing   Problem: Pain Managment: Goal: General experience of comfort will improve Outcome: Progressing   Problem: Safety: Goal: Ability to remain free from injury will improve Outcome: Progressing   Problem: Skin Integrity: Goal: Risk for impaired skin integrity will decrease Outcome: Progressing   

## 2019-10-19 NOTE — Discharge Summary (Signed)
. Physician Discharge Summary  Patient ID: Paula Bailey MRN: [id number] DOB/AGE: [birth year] 59 y.o.  Admit date: 10/11/2019 Discharge date: 10/19/2019  Admission Diagnoses: acute hypoxemic respiratory failure, community acquired pneumonia  Discharge Diagnoses:  Principal Problem:   CAP (community acquired pneumonia) Active Problems:   MDD (major depressive disorder), recurrent, in full remission (HCC)   Anxiety   Leukocytosis   Hyponatremia   Elevated brain natriuretic peptide (BNP) level   Depression   Tobacco abuse   Acute respiratory failure with hypoxia (HCC)   SIRS (systemic inflammatory response syndrome) (HCC)   Sepsis (HCC)   Sepsis due to undetermined organism (HCC)   Chronic pain syndrome   Lobar pneumonia (HCC)   Discharged Condition: good,  Hospital Course: The patient is a 59 year old woman with history of tobacco use disorder, suspected COPD although without a formal diagnosis.  She was admitted on 10/11/2019 for 3 days of worsening shortness of breath, cough, malaise and lethargy.  On arrival to the emergency department she was found to be hypoxemic and required 4 L nasal cannula.  Procalcitonin was negative, although her CT chest did show bilateral pulmonary infiltrates consistent with ARDS versus a viral pneumonia.  Her COVID-19 test was negative x2.  She was initially admitted to hospital medicine but then decompensated and required intubation 2/12 and transferred to the ICU.  She was treated with broad-spectrum antibiotics and narrowed to ceftriaxone azithromycin.  Completed course of empiric antibiotics.  She was also treated with steroids for COPD exacerbation.  She was extubated on 2/16.  Evaluated by physical therapy and felt to be safe to discharged home with home physical therapy.  She did not require oxygen at discharge.  He will follow-up with [place] in pulmonary clinic for ongoing management of COPD.  An appointment will be made for her and she will be contacted  after discharge.   Significant Diagnostic Studies:  CT chest performed 10/11/2019  IMPRESSION: 1. No pulmonary embolus or acute aortic syndrome. 2. Small bilateral pleural effusions and diffuse ground-glass opacities throughout both lungs, which could indicate infection or pulmonary edema.   Discharge Exam: Blood pressure 129/61, pulse (!) 57, temperature (!) 97.5 F (36.4 C), temperature source Oral, resp. rate 16, height 5\' 2"  (1.575 m), weight 70.6 kg, SpO2 94 %. General appearance: alert, cooperative and no distress Head: Normocephalic, without obvious abnormality, atraumatic Resp: clear to auscultation bilaterally and No wheezes or crackles Chest wall: no tenderness Cardio: regular rate and rhythm, S1, S2 normal, no murmur, click, rub or gallop Extremities: extremities normal, atraumatic, no cyanosis or edema Pulses: 2+ and symmetric Skin: Skin color, texture, turgor normal. No rashes or lesions Neurologic: Grossly normal  Disposition:   to home with home PT.    Allergies as of 10/19/2019   No Known Allergies     Medication List    TAKE these medications   alendronate 70 MG tablet Commonly known as: FOSAMAX Take 70 mg by mouth once a week.   ALPRAZolam 0.5 MG tablet Commonly known as: XANAX Take 0.25 mg by mouth at bedtime.   amitriptyline 10 MG tablet Commonly known as: ELAVIL Take 20 mg by mouth at bedtime.   fluticasone furoate-vilanterol 200-25 MCG/INH Aepb Commonly known as: BREO ELLIPTA Inhale 1 puff into the lungs daily.   gabapentin 300 MG capsule Commonly known as: NEURONTIN Take 600 mg by mouth 3 (three) times daily.   oxyCODONE-acetaminophen 7.5-325 MG tablet Commonly known as: PERCOCET Take 1 tablet by mouth every 8 (eight) hours as  needed for moderate pain or severe pain.   sertraline 100 MG tablet Commonly known as: ZOLOFT Take one and a half tablets (150 mg) daily What changed:   how much to take  how to take this  when to take  this  additional instructions   simvastatin 10 MG tablet Commonly known as: ZOCOR Take 10 mg by mouth at bedtime.   umeclidinium bromide 62.5 MCG/INH Aepb Commonly known as: INCRUSE ELLIPTA Inhale 1 puff into the lungs daily.   Ventolin HFA 108 (90 Base) MCG/ACT inhaler Generic drug: albuterol Inhale 1-2 puffs into the lungs every 4 (four) hours as needed for wheezing or shortness of breath.      Time coordinating discharge is 37 minutes.  Signed: Spero Geralds 10/19/2019, 9:11 AM

## 2019-10-19 NOTE — Progress Notes (Signed)
D/C instruction give to pt , sister at bedside .Both verbalized understanding . F/U instruction given . Awaiting transport

## 2019-10-19 NOTE — Plan of Care (Signed)
  Problem: Education: Goal: Knowledge of General Education information will improve Description: Including pain rating scale, medication(s)/side effects and non-pharmacologic comfort measures 10/19/2019 1126 by Darylene Vestal, RN Outcome: Completed/Met 10/19/2019 0948 by Darylene Mossberg, RN Outcome: Progressing   Problem: Clinical Measurements: Goal: Will remain free from infection 10/19/2019 1126 by Darylene Clausing, RN Outcome: Completed/Met 10/19/2019 0948 by Darylene Whittenberg, RN Outcome: Progressing   Problem: Nutrition: Goal: Adequate nutrition will be maintained 10/19/2019 1126 by Darylene Douthat, RN Outcome: Completed/Met 10/19/2019 0948 by Darylene Leedy, RN Outcome: Progressing   Problem: Pain Managment: Goal: General experience of comfort will improve 10/19/2019 1126 by Darylene Peddy, RN Outcome: Completed/Met 10/19/2019 0948 by Darylene Carboni, RN Outcome: Progressing   Problem: Safety: Goal: Ability to remain free from injury will improve 10/19/2019 1126 by Darylene Godina, RN Outcome: Completed/Met 10/19/2019 0948 by Darylene Baumert, RN Outcome: Progressing   Problem: Skin Integrity: Goal: Risk for impaired skin integrity will decrease 10/19/2019 1126 by Darylene Wynter, RN Outcome: Completed/Met 10/19/2019 0948 by Darylene Obeirne, RN Outcome: Progressing

## 2019-10-19 NOTE — Plan of Care (Signed)
  Problem: Clinical Measurements: Goal: Will remain free from infection Outcome: Progressing   Problem: Nutrition: Goal: Adequate nutrition will be maintained Outcome: Progressing   Problem: Pain Managment: Goal: General experience of comfort will improve Outcome: Progressing   Problem: Safety: Goal: Ability to remain free from injury will improve Outcome: Progressing   Problem: Skin Integrity: Goal: Risk for impaired skin integrity will decrease Outcome: Progressing   

## 2019-10-19 NOTE — Discharge Instructions (Signed)
Acute Respiratory Failure, Adult  Acute respiratory failure occurs when there is not enough oxygen passing from your lungs to your body. When this happens, your lungs have trouble removing carbon dioxide from the blood. This causes your blood oxygen level to drop too low as carbon dioxide builds up. Acute respiratory failure is a medical emergency. It can develop quickly, but it is temporary if treated promptly. Your lung capacity, or how much air your lungs can hold, may improve with time, exercise, and treatment. What are the causes? There are many possible causes of acute respiratory failure, including:  Lung injury.  Chest injury or damage to the ribs or tissues near the lungs.  Lung conditions that affect the flow of air and blood into and out of the lungs, such as pneumonia, acute respiratory distress syndrome, and cystic fibrosis.  Medical conditions, such as strokes or spinal cord injuries, that affect the muscles and nerves that control breathing.  Blood infection (sepsis).  Inflammation of the pancreas (pancreatitis).  A blood clot in the lungs (pulmonary embolism).  A large-volume blood transfusion.  Burns.  Near-drowning.  Seizure.  Smoke inhalation.  Reaction to medicines.  Alcohol or drug overdose. What increases the risk? This condition is more likely to develop in people who have:  A blocked airway.  Asthma.  A condition or disease that damages or weakens the muscles, nerves, bones, or tissues that are involved in breathing.  A serious infection.  A health problem that blocks the unconscious reflex that is involved in breathing, such as hypothyroidism or sleep apnea.  A lung injury or trauma. What are the signs or symptoms? Trouble breathing is the main symptom of acute respiratory failure. Symptoms may also include:  Rapid breathing.  Restlessness or anxiety.  Skin, lips, or fingernails that appear blue (cyanosis).  Rapid heart  rate.  Abnormal heart rhythms (arrhythmias).  Confusion or changes in behavior.  Tiredness or loss of energy.  Feeling sleepy or having a loss of consciousness. How is this diagnosed? Your health care provider can diagnose acute respiratory failure with a medical history and physical exam. During the exam, your health care provider will listen to your heart and check for crackling or wheezing sounds in your lungs. Your may also have tests to confirm the diagnosis and determine what is causing respiratory failure. These tests may include:  Measuring the amount of oxygen in your blood (pulse oximetry). The measurement comes from a small device that is placed on your finger, earlobe, or toe.  Other blood tests to measure blood gases and to look for signs of infection.  Sampling your cerebral spinal fluid or tracheal fluid to check for infections.  Chest X-ray to look for fluid in spaces that should be filled with air.  Electrocardiogram (ECG) to look at the heart's electrical activity. How is this treated? Treatment for this condition usually takes places in a hospital intensive care unit (ICU). Treatment depends on what is causing the condition. It may include one or more treatments until your symptoms improve. Treatment may include:  Supplemental oxygen. Extra oxygen is given through a tube in the nose, a face mask, or a hood.  A device such as a continuous positive airway pressure (CPAP) or bi-level positive airway pressure (BiPAP or BPAP) machine. This treatment uses mild air pressure to keep the airways open. A mask or other device will be placed over your nose or mouth. A tube that is connected to a motor will deliver oxygen through the   mask.  Ventilator. This treatment helps move air into and out of the lungs. This may be done with a bag and mask or a machine. For this treatment, a tube is placed in your windpipe (trachea) so air and oxygen can flow to the lungs.  Extracorporeal  membrane oxygenation (ECMO). This treatment temporarily takes over the function of the heart and lungs, supplying oxygen and removing carbon dioxide. ECMO gives the lungs a chance to recover. It may be used if a ventilator is not effective.  Tracheostomy. This is a procedure that creates a hole in the neck to insert a breathing tube.  Receiving fluids and medicines.  Rocking the bed to help breathing. Follow these instructions at home:  Take over-the-counter and prescription medicines only as told by your health care provider.  Return to normal activities as told by your health care provider. Ask your health care provider what activities are safe for you.  Keep all follow-up visits as told by your health care provider. This is important. How is this prevented? Treating infections and medical conditions that may lead to acute respiratory failure can help prevent the condition from developing. Contact a health care provider if:  You have a fever.  Your symptoms do not improve or they get worse. Get help right away if:  You are having trouble breathing.  You lose consciousness.  Your have cyanosis or turn blue.  You develop a rapid heart rate.  You are confused. These symptoms may represent a serious problem that is an emergency. Do not wait to see if the symptoms will go away. Get medical help right away. Call your local emergency services (911 in the [place] Do not drive yourself to the hospital. This information is not intended to replace advice given to you by your health care provider. Make sure you discuss any questions you have with your health care provider. Document Revised: 07/29/2017 Document Reviewed: 03/03/2016 Elsevier Patient Education  2020 Elsevier Inc.   COPD and Physical Activity Chronic obstructive pulmonary disease (COPD) is a long-term (chronic) condition that affects the lungs. COPD is a general term that can be used to describe many different lung problems  that cause lung swelling (inflammation) and limit airflow, including chronic bronchitis and emphysema. The main symptom of COPD is shortness of breath, which makes it harder to do even simple tasks. This can also make it harder to exercise and be active. Talk with your health care provider about treatments to help you breathe better and actions you can take to prevent breathing problems during physical activity. What are the benefits of exercising with COPD? Exercising regularly is an important part of a healthy lifestyle. You can still exercise and do physical activities even though you have COPD. Exercise and physical activity improve your shortness of breath by increasing blood flow (circulation). This causes your heart to pump more oxygen through your body. Moderate exercise can improve your:  Oxygen use.  Energy level.  Shortness of breath.  Strength in your breathing muscles.  Heart health.  Sleep.  Self-esteem and feelings of self-worth.  Depression, stress, and anxiety levels. Exercise can benefit everyone with COPD. The severity of your disease may affect how hard you can exercise, especially at first, but everyone can benefit. Talk with your health care provider about how much exercise is safe for you, and which activities and exercises are safe for you. What actions can I take to prevent breathing problems during physical activity?  Sign up for a pulmonary   rehabilitation program. This type of program may include: ? Education about lung diseases. ? Exercise classes that teach you how to exercise and be more active while improving your breathing. This usually involves:  Exercise using your lower extremities, such as a stationary bicycle.  About 30 minutes of exercise, 2 to 5 times per week, for 6 to 12 weeks  Strength training, such as push ups or leg lifts. ? Nutrition education. ? Group classes in which you can talk with others who also have COPD and learn ways to manage  stress.  If you use an oxygen tank, you should use it while you exercise. Work with your health care provider to adjust your oxygen for your physical activity. Your resting flow rate is different from your flow rate during physical activity.  While you are exercising: ? Take slow breaths. ? Pace yourself and do not try to go too fast. ? Purse your lips while breathing out. Pursing your lips is similar to a kissing or whistling position. ? If doing exercise that uses a quick burst of effort, such as weight lifting:  Breathe in before starting the exercise.  Breathe out during the hardest part of the exercise (such as raising the weights). Where to find support You can find support for exercising with COPD from:  Your health care provider.  A pulmonary rehabilitation program.  Your local health department or community health programs.  Support groups, online or in-person. Your health care provider may be able to recommend support groups. Where to find more information You can find more information about exercising with COPD from:  American Lung Association: lung.org.  COPD Foundation: copdfoundation.org. Contact a health care provider if:  Your symptoms get worse.  You have chest pain.  You have nausea.  You have a fever.  You have trouble talking or catching your breath.  You want to start a new exercise program or a new activity. Summary  COPD is a general term that can be used to describe many different lung problems that cause lung swelling (inflammation) and limit airflow. This includes chronic bronchitis and emphysema.  Exercise and physical activity improve your shortness of breath by increasing blood flow (circulation). This causes your heart to provide more oxygen to your body.  Contact your health care provider before starting any exercise program or new activity. Ask your health care provider what exercises and activities are safe for you. This information is  not intended to replace advice given to you by your health care provider. Make sure you discuss any questions you have with your health care provider. Document Revised: 12/06/2018 Document Reviewed: 09/08/2017 Elsevier Patient Education  2020 Elsevier Inc.  

## 2019-10-19 NOTE — Evaluation (Signed)
Occupational Therapy Evaluation Patient Details Name: Paula Bailey MRN: [id number] DOB: 07-20-1961 Today's Date: 10/19/2019    History of Present Illness Pt is 59 yo female with hx of COPD, depression/anxiety, Hyperlipidemia.  She continues to smoke cigarettes.  Pt was admitted with ARDS and intubated 10/12/19/ and extubated 10/16/19.  Pt now on RA.   Clinical Impression   Patient is a 59 year old female that lives with her sister in a single level home with level entry. Patient is independent at baseline, helps care for grandchildren. Currently patient is at baseline for basic ADLs, does not require any physical assistance. Will discontinue acute OT services at this time.    Follow Up Recommendations  No OT follow up    Equipment Recommendations  None recommended by OT       Precautions / Restrictions Precautions Precautions: Fall Restrictions Weight Bearing Restrictions: No      Mobility Bed Mobility Overal bed mobility: Modified Independent Bed Mobility: Supine to Sit     Supine to sit: HOB elevated        Transfers Overall transfer level: Modified independent Equipment used: None Transfers: Sit to/from Stand Sit to Stand: Modified independent (Device/Increase time)         General transfer comment: no physical assist needed    Balance Overall balance assessment: Mild deficits observed, not formally tested                                         ADL either performed or assessed with clinical judgement   ADL Overall ADL's : Independent                                       General ADL Comments: patient able to don socks, perform sink side grooming tasks and functional transfers without physical assistance     Vision Baseline Vision/History: Wears glasses Wears Glasses: At all times              Pertinent Vitals/Pain Pain Assessment: Faces Faces Pain Scale: Hurts little more Pain Location: back Pain Descriptors /  Indicators: Aching(chronic pain) Pain Intervention(s): Patient requesting pain meds-RN notified     Hand Dominance Right   Extremity/Trunk Assessment Upper Extremity Assessment Upper Extremity Assessment: Overall WFL for tasks assessed   Lower Extremity Assessment Lower Extremity Assessment: Defer to PT evaluation   Cervical / Trunk Assessment Cervical / Trunk Assessment: Normal   Communication Communication Communication: No difficulties   Cognition Arousal/Alertness: Awake/alert Behavior During Therapy: WFL for tasks assessed/performed Overall Cognitive Status: Within Functional Limits for tasks assessed                                                Home Living Family/patient expects to be discharged to:: Private residence Living Arrangements: Other relatives Available Help at Discharge: Family;Available 24 hours/day Type of Home: House Home Access: Level entry     Home Layout: One level     Bathroom Shower/Tub: Chief Strategy Officer: Standard     Home Equipment: Grab bars - tub/shower   Additional Comments: pt reports either her sister or her daughter will be home with her  Prior Functioning/Environment Level of Independence: Independent        Comments: Reports  independent with all ADLs, IADLs, driving, and community ambulation.  Pt does not works -has disability.        OT Problem List: Decreased activity tolerance       AM-PAC OT "6 Clicks" Daily Activity     Outcome Measure Help from another person eating meals?: None Help from another person taking care of personal grooming?: None Help from another person toileting, which includes using toliet, bedpan, or urinal?: None Help from another person bathing (including washing, rinsing, drying)?: None Help from another person to put on and taking off regular upper body clothing?: None Help from another person to put on and taking off regular lower body clothing?:  None 6 Click Score: 24   End of Session Nurse Communication: Patient requests pain meds;Mobility status  Activity Tolerance: Patient tolerated treatment well Patient left: in bed;with call bell/phone within reach  OT Visit Diagnosis: Other abnormalities of gait and mobility (R26.89)                Time: 9983-3825 OT Time Calculation (min): 15 min Charges:  OT General Charges $OT Visit: 1 Visit OT Evaluation $OT Eval Low Complexity: [place] OT OT office: [place] 10/19/2019, 10:01 AM

## 2019-10-19 NOTE — TOC Initial Note (Addendum)
Transition of Care [facility]) - Initial/Assessment Note    Patient Details  Name: Paula Bailey MRN: [id number] Date of Birth: [birth year]  [facility] CM/SW Contact:    Marilu Favre, RN Phone Number: 10/19/2019, 10:49 AM  Clinical Narrative:                 Patient from home with sister. Discussed home health PT, provided medicare.gov home health agency list. Her sister Shirlean Mylar is on her way to the hospital. Patient would like to speak with her before making a decision. NCM will follow up.  Elnoria Howard picked [place] with [facility] accepted referral.  Expected Discharge Plan: [place] Barriers to Discharge: No Barriers Identified   Patient Goals and CMS Choice Patient states their goals for this hospitalization and ongoing recovery are:: to return to home CMS Medicare.gov Compare Post Acute Care list provided to:: Patient Choice offered to / list presented to : Patient  Expected Discharge Plan and Services Expected Discharge Plan: [place]   Discharge Planning Services: CM Consult Post Acute Care Choice: [place] arrangements for the past 2 months: Single Family Home Expected Discharge Date: 10/19/19               DME Arranged: N/A         HH Arranged: PT          Prior Living Arrangements/Services Living arrangements for the past 2 months: Single Family Home Lives with:: Siblings Patient language and need for interpreter reviewed:: Yes Do you feel safe going back to the place where you live?: Yes      Need for Family Participation in Patient Care: Yes (Comment) Care giver support system in place?: Yes (comment)   Criminal Activity/Legal Involvement Pertinent to Current Situation/Hospitalization: No - Comment as needed  Activities of Daily Living Home Assistive Devices/Equipment: None ADL Screening (condition at time of admission) Patient's cognitive ability adequate to safely  complete daily activities?: Yes Is the patient deaf or have difficulty hearing?: No Does the patient have difficulty seeing, even when wearing glasses/contacts?: No Does the patient have difficulty concentrating, remembering, or making decisions?: Yes Patient able to express need for assistance with ADLs?: Yes Does the patient have difficulty dressing or bathing?: No Independently performs ADLs?: Yes (appropriate for developmental age) Does the patient have difficulty walking or climbing stairs?: Yes Weakness of Legs: Both Weakness of Arms/Hands: Both  Permission Sought/Granted   Permission granted to share information with : No              Emotional Assessment Appearance:: Appears stated age Attitude/Demeanor/Rapport: Engaged Affect (typically observed): Accepting Orientation: : Oriented to Self, Oriented to Place, Oriented to  Time, Oriented to Situation Alcohol / Substance Use: Not Applicable Psych Involvement: No (comment)  Admission diagnosis:  CAP (community acquired pneumonia) [J18.9] Multifocal pneumonia [J18.9] Community acquired pneumonia, unspecified laterality [J18.9] Patient Active Problem List   Diagnosis Date Noted  . Sepsis due to undetermined organism ([place]) 10/12/2019  . Chronic pain syndrome 10/12/2019  . Lobar pneumonia ([place]) 10/12/2019  . CAP (community acquired pneumonia) 10/11/2019  . Leukocytosis 10/11/2019  . Hyponatremia 10/11/2019  . Elevated brain natriuretic peptide (BNP) level 10/11/2019  . Depression 10/11/2019  . Tobacco abuse 10/11/2019  . Acute respiratory failure with hypoxia ([place]) 10/11/2019  . SIRS (systemic inflammatory response syndrome) ([place]) 10/11/2019  . Sepsis ([place]) 10/11/2019  . MDD (major depressive disorder), recurrent, in full  remission (HCC) 09/20/2019  . Anxiety 09/20/2019  . Current moderate episode of major depressive disorder without prior episode (HCC) 09/27/2017   PCP:  Ignatius Specking, MD Pharmacy:   [facility]  - [place], [place] - [address] [place] [address] Phone: [phone] Fax: [phone]     Social Determinants of Health (SDOH) Interventions    Readmission Risk Interventions No flowsheet data found.

## 2019-10-22 NOTE — Telephone Encounter (Signed)
Since msg already left by triage, will forward to the front desk pool for further f/u on scheduling thanks

## 2019-10-22 NOTE — Telephone Encounter (Signed)
ATC patient at mobile number, no answer, left message to call office. ATC home number, busy signal only. Will try again at a later time.

## 2019-10-23 DIAGNOSIS — J441 Chronic obstructive pulmonary disease with (acute) exacerbation: Secondary | ICD-10-CM | POA: Diagnosis not present

## 2019-10-23 DIAGNOSIS — J189 Pneumonia, unspecified organism: Secondary | ICD-10-CM | POA: Diagnosis not present

## 2019-10-23 DIAGNOSIS — J440 Chronic obstructive pulmonary disease with acute lower respiratory infection: Secondary | ICD-10-CM | POA: Diagnosis not present

## 2019-10-23 DIAGNOSIS — F419 Anxiety disorder, unspecified: Secondary | ICD-10-CM | POA: Diagnosis not present

## 2019-10-23 DIAGNOSIS — A419 Sepsis, unspecified organism: Secondary | ICD-10-CM | POA: Diagnosis not present

## 2019-10-23 DIAGNOSIS — J9621 Acute and chronic respiratory failure with hypoxia: Secondary | ICD-10-CM | POA: Diagnosis not present

## 2019-10-23 DIAGNOSIS — F329 Major depressive disorder, single episode, unspecified: Secondary | ICD-10-CM | POA: Diagnosis not present

## 2019-10-23 DIAGNOSIS — G8929 Other chronic pain: Secondary | ICD-10-CM | POA: Diagnosis not present

## 2019-10-23 DIAGNOSIS — J80 Acute respiratory distress syndrome: Secondary | ICD-10-CM | POA: Diagnosis not present

## 2019-10-23 DIAGNOSIS — F17210 Nicotine dependence, cigarettes, uncomplicated: Secondary | ICD-10-CM | POA: Diagnosis not present

## 2019-10-26 DIAGNOSIS — J189 Pneumonia, unspecified organism: Secondary | ICD-10-CM | POA: Diagnosis not present

## 2019-10-26 DIAGNOSIS — J440 Chronic obstructive pulmonary disease with acute lower respiratory infection: Secondary | ICD-10-CM | POA: Diagnosis not present

## 2019-10-26 DIAGNOSIS — J441 Chronic obstructive pulmonary disease with (acute) exacerbation: Secondary | ICD-10-CM | POA: Diagnosis not present

## 2019-10-26 DIAGNOSIS — J9621 Acute and chronic respiratory failure with hypoxia: Secondary | ICD-10-CM | POA: Diagnosis not present

## 2019-10-26 DIAGNOSIS — J80 Acute respiratory distress syndrome: Secondary | ICD-10-CM | POA: Diagnosis not present

## 2019-10-26 DIAGNOSIS — A419 Sepsis, unspecified organism: Secondary | ICD-10-CM | POA: Diagnosis not present

## 2019-10-28 DIAGNOSIS — A419 Sepsis, unspecified organism: Secondary | ICD-10-CM | POA: Diagnosis not present

## 2019-10-29 DIAGNOSIS — J189 Pneumonia, unspecified organism: Secondary | ICD-10-CM | POA: Diagnosis not present

## 2019-10-29 DIAGNOSIS — J440 Chronic obstructive pulmonary disease with acute lower respiratory infection: Secondary | ICD-10-CM | POA: Diagnosis not present

## 2019-10-29 DIAGNOSIS — J441 Chronic obstructive pulmonary disease with (acute) exacerbation: Secondary | ICD-10-CM | POA: Diagnosis not present

## 2019-10-29 DIAGNOSIS — A419 Sepsis, unspecified organism: Secondary | ICD-10-CM | POA: Diagnosis not present

## 2019-10-29 DIAGNOSIS — J80 Acute respiratory distress syndrome: Secondary | ICD-10-CM | POA: Diagnosis not present

## 2019-10-29 DIAGNOSIS — J9621 Acute and chronic respiratory failure with hypoxia: Secondary | ICD-10-CM | POA: Diagnosis not present

## 2019-10-31 DIAGNOSIS — E871 Hypo-osmolality and hyponatremia: Secondary | ICD-10-CM | POA: Diagnosis not present

## 2019-10-31 DIAGNOSIS — Z6825 Body mass index (BMI) 25.0-25.9, adult: Secondary | ICD-10-CM | POA: Diagnosis not present

## 2019-10-31 DIAGNOSIS — J189 Pneumonia, unspecified organism: Secondary | ICD-10-CM | POA: Diagnosis not present

## 2019-10-31 DIAGNOSIS — Z09 Encounter for follow-up examination after completed treatment for conditions other than malignant neoplasm: Secondary | ICD-10-CM | POA: Diagnosis not present

## 2019-11-02 DIAGNOSIS — A419 Sepsis, unspecified organism: Secondary | ICD-10-CM | POA: Diagnosis not present

## 2019-11-02 DIAGNOSIS — J441 Chronic obstructive pulmonary disease with (acute) exacerbation: Secondary | ICD-10-CM | POA: Diagnosis not present

## 2019-11-02 DIAGNOSIS — J80 Acute respiratory distress syndrome: Secondary | ICD-10-CM | POA: Diagnosis not present

## 2019-11-02 DIAGNOSIS — J9621 Acute and chronic respiratory failure with hypoxia: Secondary | ICD-10-CM | POA: Diagnosis not present

## 2019-11-02 DIAGNOSIS — J440 Chronic obstructive pulmonary disease with acute lower respiratory infection: Secondary | ICD-10-CM | POA: Diagnosis not present

## 2019-11-02 DIAGNOSIS — J189 Pneumonia, unspecified organism: Secondary | ICD-10-CM | POA: Diagnosis not present

## 2019-11-05 DIAGNOSIS — J440 Chronic obstructive pulmonary disease with acute lower respiratory infection: Secondary | ICD-10-CM | POA: Diagnosis not present

## 2019-11-05 DIAGNOSIS — A419 Sepsis, unspecified organism: Secondary | ICD-10-CM | POA: Diagnosis not present

## 2019-11-05 DIAGNOSIS — J441 Chronic obstructive pulmonary disease with (acute) exacerbation: Secondary | ICD-10-CM | POA: Diagnosis not present

## 2019-11-05 DIAGNOSIS — J80 Acute respiratory distress syndrome: Secondary | ICD-10-CM | POA: Diagnosis not present

## 2019-11-05 DIAGNOSIS — J9621 Acute and chronic respiratory failure with hypoxia: Secondary | ICD-10-CM | POA: Diagnosis not present

## 2019-11-05 DIAGNOSIS — J189 Pneumonia, unspecified organism: Secondary | ICD-10-CM | POA: Diagnosis not present

## 2019-11-08 DIAGNOSIS — A419 Sepsis, unspecified organism: Secondary | ICD-10-CM | POA: Diagnosis not present

## 2019-11-08 DIAGNOSIS — J80 Acute respiratory distress syndrome: Secondary | ICD-10-CM | POA: Diagnosis not present

## 2019-11-08 DIAGNOSIS — J440 Chronic obstructive pulmonary disease with acute lower respiratory infection: Secondary | ICD-10-CM | POA: Diagnosis not present

## 2019-11-08 DIAGNOSIS — J9621 Acute and chronic respiratory failure with hypoxia: Secondary | ICD-10-CM | POA: Diagnosis not present

## 2019-11-08 DIAGNOSIS — J189 Pneumonia, unspecified organism: Secondary | ICD-10-CM | POA: Diagnosis not present

## 2019-11-08 DIAGNOSIS — J441 Chronic obstructive pulmonary disease with (acute) exacerbation: Secondary | ICD-10-CM | POA: Diagnosis not present

## 2019-11-12 NOTE — Progress Notes (Signed)
Virtual Visit via Telephone Note  I connected with Paula Bailey on 11/20/19 at  9:00 AM EDT by telephone and verified that I am speaking with the correct person using two identifiers.   I discussed the limitations, risks, security and privacy concerns of performing an evaluation and management service by telephone and the availability of in person appointments. I also discussed with the patient that there may be a patient responsible charge related to this service. The patient expressed understanding and agreed to proceed.    I discussed the assessment and treatment plan with the patient. The patient was provided an opportunity to ask questions and all were answered. The patient agreed with the plan and demonstrated an understanding of the instructions.   The patient was advised to call back or seek an in-person evaluation if the symptoms worsen or if the condition fails to improve as anticipated.  I provided 8 minutes of non-face-to-face time during this encounter.   [name], MD     [facility] MD/PA/NP OP Progress Note  11/20/2019 9:35 AM Paula Bailey  MRN:  [id number]  Chief Complaint:  Chief Complaint    Depression; Follow-up     HPI:  - sertraline was tapered down to 150 mg daily due to reported fatigue This is a follow-up appointment for depression.  She states that she has been doing better after sertraline was tapered down.  She goes to grocery store, and takes a bath twice a day.  She was admitted for pneumonia last month; she has been working on with the physical therapist, who comes a few times per week. She denies feeling depressed. She sleeps well, taking amitriptyline.  She has fair motivation and energy.  She has good appetite.  She has fair concentration.  She denies SI.  She denies anxiety or panic attacks.   Visit Diagnosis:    ICD-10-CM   1. MDD (major depressive disorder), recurrent, in full remission (HCC)  F33.42 sertraline (ZOLOFT) 100 MG tablet  2. Anxiety   F41.9 sertraline (ZOLOFT) 100 MG tablet    Past Psychiatric History: Please see initial evaluation for full details. I have reviewed the history. No updates at this time.     Past Medical History:  Past Medical History:  Diagnosis Date  . ARDS (adult respiratory distress syndrome) (HCC)   . Dyslipidemia     Past Surgical History:  Procedure Laterality Date  . ABDOMINAL HYSTERECTOMY    . CHOLECYSTECTOMY    . TONSILLECTOMY      Family Psychiatric History: Please see initial evaluation for full details. I have reviewed the history. No updates at this time.     Family History:  Family History  Problem Relation Age of Onset  . CAD Father   . CAD Brother     Social History:  Social History   Socioeconomic History  . Marital status: Divorced    Spouse name: Not on file  . Number of children: Not on file  . Years of education: Not on file  . Highest education level: Not on file  Occupational History  . Not on file  Tobacco Use  . Smoking status: Current Every Day Smoker    Packs/day: 0.50    Years: 40.00    Pack years: 20.00    Start date: 11/13/1981  . Smokeless tobacco: Never Used  Substance and Sexual Activity  . Alcohol use: No  . Drug use: Not Currently  . Sexual activity: Not Currently  Other Topics Concern  . Not on  file  Social History Narrative  . Not on file   Social Determinants of Health   Financial Resource Strain:   . Difficulty of Paying Living Expenses:   Food Insecurity:   . Worried About Charity fundraiser in the Last Year:   . Arboriculturist in the Last Year:   Transportation Needs:   . Film/video editor (Medical):   Marland Kitchen Lack of Transportation (Non-Medical):   Physical Activity:   . Days of Exercise per Week:   . Minutes of Exercise per Session:   Stress:   . Feeling of Stress :   Social Connections:   . Frequency of Communication with Friends and Family:   . Frequency of Social Gatherings with Friends and Family:   . Attends  Religious Services:   . Active Member of Clubs or Organizations:   . Attends Archivist Meetings:   Marland Kitchen Marital Status:     Allergies: No Known Allergies  Metabolic Disorder Labs: No results found for: HGBA1C, MPG No results found for: PROLACTIN Lab Results  Component Value Date   TRIG 240 (H) 10/14/2019   No results found for: TSH  Therapeutic Level Labs: No results found for: LITHIUM No results found for: VALPROATE No components found for:  CBMZ  Current Medications: Current Outpatient Medications  Medication Sig Dispense Refill  . alendronate (FOSAMAX) 70 MG tablet Take 70 mg by mouth once a week.  6  . ALPRAZolam (XANAX) 0.5 MG tablet Take 0.25 mg by mouth at bedtime.   0  . amitriptyline (ELAVIL) 10 MG tablet Take 20 mg by mouth at bedtime.    . fluticasone furoate-vilanterol (BREO ELLIPTA) 200-25 MCG/INH AEPB Inhale 1 puff into the lungs daily. 1 each 2  . gabapentin (NEURONTIN) 300 MG capsule Take 600 mg by mouth 3 (three) times daily.   2  . oxyCODONE-acetaminophen (PERCOCET) 7.5-325 MG tablet Take 1 tablet by mouth every 8 (eight) hours as needed for moderate pain or severe pain.   0  . [START ON 12/18/2019] sertraline (ZOLOFT) 100 MG tablet Take 1.5 tablets (150 mg total) by mouth daily. 135 tablet 0  . simvastatin (ZOCOR) 10 MG tablet Take 10 mg by mouth at bedtime.    Marland Kitchen umeclidinium bromide (INCRUSE ELLIPTA) 62.5 MCG/INH AEPB Inhale 1 puff into the lungs daily. 1 each 2  . VENTOLIN HFA 108 (90 Base) MCG/ACT inhaler Inhale 1-2 puffs into the lungs every 4 (four) hours as needed for wheezing or shortness of breath.   12   No current facility-administered medications for this visit.     Musculoskeletal: Strength & Muscle Tone: N/A Gait & Station: N/A Patient leans: N/A  Psychiatric Specialty Exam: Review of Systems  Psychiatric/Behavioral: Negative for agitation, behavioral problems, confusion, decreased concentration, dysphoric mood, hallucinations,  self-injury, sleep disturbance and suicidal ideas. The patient is not nervous/anxious and is not hyperactive.   All other systems reviewed and are negative.   There were no vitals taken for this visit.There is no height or weight on file to calculate BMI.  General Appearance: NA  Eye Contact:  NA  Speech:  Clear and Coherent  Volume:  Normal  Mood:  "good"  Affect:  NA  Thought Process:  Coherent  Orientation:  Full (Time, Place, and Person)  Thought Content: Logical   Suicidal Thoughts:  No  Homicidal Thoughts:  No  Memory:  Immediate;   Good  Judgement:  Good  Insight:  Present  Psychomotor Activity:  Normal  Concentration:  Concentration: Good and Attention Span: Good  Recall:  Good  Fund of Knowledge: Good  Language: Good  Akathisia:  No  Handed:  Right  AIMS (if indicated): not done  Assets:  Communication Skills Desire for Improvement  ADL's:  Intact  Cognition: WNL  Sleep:  Good   Screenings:   Assessment and Plan:  Paula Bailey is a 59 y.o. year old female with a history of depression, r/o intellectual disability , who presents for follow up appointment for MDD (major depressive disorder), recurrent, in full remission (HCC) - Plan: sertraline (ZOLOFT) 100 MG tablet  Anxiety - Plan: sertraline (ZOLOFT) 100 MG tablet  # MDD in full remission, recurrent She reports overall improvement in depressive symptoms since her last visit.  We will continue current dose of sertraline to target depression.  Discussed behavioral activation.   # r/o intellectual disability Patient was in a special class as a child.  ADL independent.  Also it has been requested to bring the records of disability evaluation, her sister has not been able to bring the one to the office. No behavioral/safety concerns.   Plan I have reviewed and updated plans as below 1.Continuesertraline 150 mg at night  2.Next appointment: 6/21 at 10:40 for 20 mins, phone 4. TSH reportedly normal per  patient (not known when it was checked the last time) (She has been on amitriptyline 20 mg qhs, oxycodone, Xanax 0.5 mgdaily as needed for anxiety by other provider)  Past trials of medication:Paxil, duloxetine (drowsiness),bupropion, Xanax  The patient demonstrates the following risk factors for suicide: Chronic risk factors for suicide include:psychiatric disorder ofdepression. Acute risk factorsfor suicide include: unemployment. Protective factorsfor this patient include: positive social support, coping skills and hope for the future. Considering these factors, the overall suicide risk at this point appears to below  [name], MD 11/20/2019, 9:35 AM

## 2019-11-13 DIAGNOSIS — J80 Acute respiratory distress syndrome: Secondary | ICD-10-CM | POA: Diagnosis not present

## 2019-11-13 DIAGNOSIS — J441 Chronic obstructive pulmonary disease with (acute) exacerbation: Secondary | ICD-10-CM | POA: Diagnosis not present

## 2019-11-13 DIAGNOSIS — J9621 Acute and chronic respiratory failure with hypoxia: Secondary | ICD-10-CM | POA: Diagnosis not present

## 2019-11-13 DIAGNOSIS — A419 Sepsis, unspecified organism: Secondary | ICD-10-CM | POA: Diagnosis not present

## 2019-11-13 DIAGNOSIS — J440 Chronic obstructive pulmonary disease with acute lower respiratory infection: Secondary | ICD-10-CM | POA: Diagnosis not present

## 2019-11-13 DIAGNOSIS — J189 Pneumonia, unspecified organism: Secondary | ICD-10-CM | POA: Diagnosis not present

## 2019-11-14 ENCOUNTER — Ambulatory Visit (INDEPENDENT_AMBULATORY_CARE_PROVIDER_SITE_OTHER): Payer: Medicare Other | Admitting: Internal Medicine

## 2019-11-14 ENCOUNTER — Other Ambulatory Visit: Payer: Self-pay

## 2019-11-14 ENCOUNTER — Encounter: Payer: Self-pay | Admitting: Internal Medicine

## 2019-11-14 VITALS — BP 114/62 | HR 90 | Temp 97.0°F | Ht 62.0 in | Wt 146.4 lb

## 2019-11-14 DIAGNOSIS — F17210 Nicotine dependence, cigarettes, uncomplicated: Secondary | ICD-10-CM | POA: Diagnosis not present

## 2019-11-14 DIAGNOSIS — F17200 Nicotine dependence, unspecified, uncomplicated: Secondary | ICD-10-CM

## 2019-11-14 DIAGNOSIS — J410 Simple chronic bronchitis: Secondary | ICD-10-CM | POA: Diagnosis not present

## 2019-11-14 NOTE — Patient Instructions (Signed)
The patient should have follow up scheduled with myself in 3 months.   Prior to next visit patient should have: Spirometry  Continue Incruse, Breo, once every day. Take albuterol as needed.   What are the benefits of quitting smoking? Quitting smoking can lower your chances of getting or dying from heart disease, lung disease, kidney failure, infection, or cancer. It can also lower your chances of getting osteoporosis, a condition that makes your bones weak. Plus, quitting smoking can help your skin look younger and reduce the chances that you will have problems with sex.  Quitting smoking will improve your health no matter how old you are, and no matter how long or how much you have smoked.  What should I do if I want to quit smoking? The letters in the word "START" can help you remember the steps to take: S = Set a quit date. T = Tell family, friends, and the people around you that you plan to quit. A = Anticipate or plan ahead for the tough times you'll face while quitting. R = Remove cigarettes and other tobacco products from your home, car, and work. T = Talk to your doctor about getting help to quit.  How can my doctor or nurse help? Your doctor or nurse can give you advice on the best way to quit. He or she can also put you in touch with counselors or other people you can call for support. Plus, your doctor or nurse can give you medicines to: ?Reduce your craving for cigarettes ?Reduce the unpleasant symptoms that happen when you stop smoking (called "withdrawal symptoms"). You can also get help from a free phone line (1-800-QUIT-NOW) or go online to [facility].  What are the symptoms of withdrawal? The symptoms include: ?Trouble sleeping ?Being irritable, anxious or restless ?Getting frustrated or angry ?Having trouble thinking clearly  Some people who stop smoking become temporarily depressed. Some people need treatment for depression, such as counseling or  antidepressant medicines. Depressed people might: ?No longer enjoy or care about doing the things they used to like to do ?Feel sad, down, hopeless, nervous, or cranky most of the day, almost every day ?Lose or gain weight ?Sleep too much or too little ?Feel tired or like they have no energy ?Feel guilty or like they are worth nothing ?Forget things or feel confused ?Move and speak more slowly than usual ?Act restless or have trouble staying still ?Think about death or suicide  If you think you might be depressed, see your doctor or nurse. Only someone trained in mental health can tell for sure if you are depressed. If you ever feel like you might hurt yourself, go straight to the nearest emergency department. Or you can call for an ambulance (in the [place] and [place], [place] 9-1-1) or call your doctor or nurse right away and tell them it is an emergency. You can also reach the [place] National Suicide Prevention Lifeline at 856-550-4237 or http://walker-sanchez.info/.  How do medicines help you stop smoking? Different medicines work in different ways: ?Nicotine replacement therapy eases withdrawal and reduces your body's craving for nicotine, the main drug found in cigarettes. There are different forms of nicotine replacement, including skin patches, lozenges, gum, nasal sprays, and "puffers" or inhalers. Many can be bought without a prescription, while others might require one. ?Bupropion is a prescription medicine that reduces your desire to smoke. This medicine is sold under the brand names Zyban and Wellbutrin. It is also available in a generic version, which  is cheaper than brand name medicines. ?Varenicline (brand names: Chantix, Champix) is a prescription medicine that reduces withdrawal symptoms and cigarette cravings. If you think you'd like to take varenicline and you have a history of depression, anxiety, or heart disease, discuss this with your doctor or nurse before taking the medicine.  Varenicline can also increase the effects of alcohol in some people. It's a good idea to limit drinking while you're taking it, at least until you know how it affects you.  How does counseling work? Counseling can happen during formal office visits or just over the phone. A counselor can help you: ?Figure out what triggers your smoking and what to do instead ?Overcome cravings ?Figure out what went wrong when you tried to quit before  What works best? Studies show that people have the best luck at quitting if they take medicines to help them quit and work with a Veterinary surgeon. It might also be helpful to combine nicotine replacement with one of the prescription medicines that help people quit. In some cases, it might even make sense to take bupropion and varenicline together.  What about e-cigarettes? Sometimes people wonder if using electronic cigarettes, or "e-cigarettes," might help them quit smoking. Using e-cigarettes is also called "vaping." Doctors do not recommend e-cigarettes in place of medicines and counseling. That's because e-cigarettes still contain nicotine as well as other substances that might be harmful. It's not clear how they can affect a person's health in the long term.  Will I gain weight if I quit? Yes, you might gain a few pounds. But quitting smoking will have a much more positive effect on your health than weighing a few pounds more. Plus, you can help prevent some weight gain by being more active and eating less. Taking the medicine bupropion might help control weight gain.   What else can I do to improve my chances of quitting? You can: ?Start exercising. ?Stay away from smokers and places that you associate with smoking. If people close to you smoke, ask them to quit with you. ?Keep gum, hard candy, or something to put in your mouth handy. If you get a craving for a cigarette, try one of these instead. ?Don't give up, even if you start smoking again. It takes most  people a few tries before they succeed.  What if I am pregnant and I smoke? If you are pregnant, it's really important for the health of your baby that you quit. Ask your doctor what options you have, and what is safest for your baby

## 2019-11-14 NOTE — Progress Notes (Signed)
Paula Bailey    [id number]    1961-06-17  Primary Care Physician:Vyas, Angelina Pih, MD Date of Appointment: 11/14/2019 Established Patient Visit  Chief complaint:   Chief Complaint  Patient presents with  . Hospitalization Follow-up    Patient reports that she's doing much better since her hospital stay. She denies andy sob or cough.      HPI: Hospitalized for ARDS. COPD Discharged on Breo and Incruse. Albuterol  Interval Updates: Since discharge therapy at home going well. Eating and drinking ok. Going for walks.  Taking Breo and Incruse each once a day. Taking albuterol 3 times a day schedule because that's how she thought she was supposed to take it. Feeling better with this. Staying with her sister right now - no one smokes in the house. Started smoking in her 69s. Uses it now to help with stress and anxiety. Enjoys the habit of having it with coffee. Has cut back but has never quit.   I have reviewed the patient's family social and past medical history and updated as appropriate.   Past Medical History:  Diagnosis Date  . ARDS (adult respiratory distress syndrome) (HCC)   . Dyslipidemia     Past Surgical History:  Procedure Laterality Date  . ABDOMINAL HYSTERECTOMY    . CHOLECYSTECTOMY    . TONSILLECTOMY      Family History  Problem Relation Age of Onset  . CAD Father   . CAD Brother     Social History   Occupational History  . Not on file  Tobacco Use  . Smoking status: Current Every Day Smoker    Packs/day: 0.50    Years: 40.00    Pack years: 20.00    Start date: 11/13/1981  . Smokeless tobacco: Never Used  Substance and Sexual Activity  . Alcohol use: No  . Drug use: Not Currently  . Sexual activity: Not Currently    Review of systems: Constitutional: No fevers, chills, night sweats, or weight loss. CV: No chest pain, or palpitations. Resp: No hemoptysis.  Physical Exam: Blood pressure 114/62, pulse 90, temperature (!) 97 F (36.1  C), temperature source Temporal, height 5\' 2"  (1.575 m), weight 146 lb 6.4 oz (66.4 kg), SpO2 97 %.  Gen:      No acute distress Lungs:    No increased respiratory effort, symmetric chest wall excursion, clear to auscultation bilaterally, no wheezes or crackles CV:         Regular rate and rhythm; no murmurs, rubs, or gallops.  No pedal edema   Data Reviewed: Imaging: I have personally reviewed the Chest xray 2/15 showing bilateral airspace opacities consistent with ARDS while in the ICU  PFTs:   Labs:  Immunization status: Immunization History  Administered Date(s) Administered  . Influenza,inj,Quad PF,6+ Mos 06/16/2019  . Influenza,inj,quad, With Preservative 05/03/2018  . Tdap 05/03/2018  . Zoster Recombinat (Shingrix) 06/01/2018    Assessment:  COPD ARDS survivor Tobacco Cessation Counseling  Plan/Recommendations: Continue Breo, Incruse Take albuterol prn only. Will obtain spirometry.  Discussed getting COVID 19 vaccine - I recommend highly.   I personally spent 10 minutes counseling the patient regarding tobacco use disorder.  Patient is symptomatic from tobacco use disorder due to the following condition: COPD.  The patient's response was pre-contemplative.  We discussed nicotine replacement therapy, Wellbutrin, Chantix.  We identified to gather patient specific barriers to change.  The patient is open to future discussions about tobacco cessation.  Return to  Care: Return in about 3 months (around 02/14/2020).   Lenice Llamas, MD [facility]monary and [place]

## 2019-11-19 ENCOUNTER — Inpatient Hospital Stay: Payer: Medicare Other | Admitting: Internal Medicine

## 2019-11-20 ENCOUNTER — Ambulatory Visit (INDEPENDENT_AMBULATORY_CARE_PROVIDER_SITE_OTHER): Payer: Medicare Other | Admitting: Psychiatry

## 2019-11-20 ENCOUNTER — Other Ambulatory Visit: Payer: Self-pay

## 2019-11-20 ENCOUNTER — Encounter (HOSPITAL_COMMUNITY): Payer: Self-pay | Admitting: Psychiatry

## 2019-11-20 DIAGNOSIS — J440 Chronic obstructive pulmonary disease with acute lower respiratory infection: Secondary | ICD-10-CM | POA: Diagnosis not present

## 2019-11-20 DIAGNOSIS — F419 Anxiety disorder, unspecified: Secondary | ICD-10-CM | POA: Diagnosis not present

## 2019-11-20 DIAGNOSIS — J189 Pneumonia, unspecified organism: Secondary | ICD-10-CM | POA: Diagnosis not present

## 2019-11-20 DIAGNOSIS — J9621 Acute and chronic respiratory failure with hypoxia: Secondary | ICD-10-CM | POA: Diagnosis not present

## 2019-11-20 DIAGNOSIS — J441 Chronic obstructive pulmonary disease with (acute) exacerbation: Secondary | ICD-10-CM | POA: Diagnosis not present

## 2019-11-20 DIAGNOSIS — J80 Acute respiratory distress syndrome: Secondary | ICD-10-CM | POA: Diagnosis not present

## 2019-11-20 DIAGNOSIS — A419 Sepsis, unspecified organism: Secondary | ICD-10-CM | POA: Diagnosis not present

## 2019-11-20 DIAGNOSIS — F3342 Major depressive disorder, recurrent, in full remission: Secondary | ICD-10-CM | POA: Diagnosis not present

## 2019-11-20 MED ORDER — SERTRALINE HCL 100 MG PO TABS: 150.0000 mg | ORAL_TABLET | Freq: Every day | ORAL | 0 refills | Status: DC

## 2019-11-20 NOTE — Patient Instructions (Signed)
1.Continuesertraline 150 mg at night  2.Next appointment: 6/21 at 10:40

## 2019-11-22 ENCOUNTER — Ambulatory Visit: Payer: Medicare Other | Attending: Internal Medicine

## 2019-11-22 DIAGNOSIS — J80 Acute respiratory distress syndrome: Secondary | ICD-10-CM | POA: Diagnosis not present

## 2019-11-22 DIAGNOSIS — J189 Pneumonia, unspecified organism: Secondary | ICD-10-CM | POA: Diagnosis not present

## 2019-11-22 DIAGNOSIS — F419 Anxiety disorder, unspecified: Secondary | ICD-10-CM | POA: Diagnosis not present

## 2019-11-22 DIAGNOSIS — F17210 Nicotine dependence, cigarettes, uncomplicated: Secondary | ICD-10-CM | POA: Diagnosis not present

## 2019-11-22 DIAGNOSIS — J441 Chronic obstructive pulmonary disease with (acute) exacerbation: Secondary | ICD-10-CM | POA: Diagnosis not present

## 2019-11-22 DIAGNOSIS — J440 Chronic obstructive pulmonary disease with acute lower respiratory infection: Secondary | ICD-10-CM | POA: Diagnosis not present

## 2019-11-22 DIAGNOSIS — Z23 Encounter for immunization: Secondary | ICD-10-CM

## 2019-11-22 DIAGNOSIS — F329 Major depressive disorder, single episode, unspecified: Secondary | ICD-10-CM | POA: Diagnosis not present

## 2019-11-22 DIAGNOSIS — G8929 Other chronic pain: Secondary | ICD-10-CM | POA: Diagnosis not present

## 2019-11-22 DIAGNOSIS — J9621 Acute and chronic respiratory failure with hypoxia: Secondary | ICD-10-CM | POA: Diagnosis not present

## 2019-11-22 DIAGNOSIS — A419 Sepsis, unspecified organism: Secondary | ICD-10-CM | POA: Diagnosis not present

## 2019-11-22 NOTE — Progress Notes (Signed)
   Covid-19 Vaccination Clinic  Name:  Paula Bailey    MRN: [id number] DOB: 11-Oct-1960  11/22/2019  Paula Bailey was observed post Covid-19 immunization for 15 minutes without incident. She was provided with Vaccine Information Sheet and instruction to access the V-Safe system.   Paula Bailey was instructed to call 911 with any severe reactions post vaccine: Marland Kitchen Difficulty breathing  . Swelling of face and throat  . A fast heartbeat  . A bad rash all over body  . Dizziness and weakness   Immunizations Administered    Name Date Dose VIS Date Route   Moderna COVID-19 Vaccine 11/22/2019  9:26 AM 0.5 mL 07/31/2019 Intramuscular   Manufacturer: Moderna   Lot: [id number]   NDC: 20037-944-46

## 2019-11-25 DIAGNOSIS — A419 Sepsis, unspecified organism: Secondary | ICD-10-CM | POA: Diagnosis not present

## 2019-11-27 DIAGNOSIS — J9621 Acute and chronic respiratory failure with hypoxia: Secondary | ICD-10-CM | POA: Diagnosis not present

## 2019-11-27 DIAGNOSIS — J189 Pneumonia, unspecified organism: Secondary | ICD-10-CM | POA: Diagnosis not present

## 2019-11-27 DIAGNOSIS — J440 Chronic obstructive pulmonary disease with acute lower respiratory infection: Secondary | ICD-10-CM | POA: Diagnosis not present

## 2019-11-27 DIAGNOSIS — A419 Sepsis, unspecified organism: Secondary | ICD-10-CM | POA: Diagnosis not present

## 2019-11-27 DIAGNOSIS — J80 Acute respiratory distress syndrome: Secondary | ICD-10-CM | POA: Diagnosis not present

## 2019-11-27 DIAGNOSIS — J441 Chronic obstructive pulmonary disease with (acute) exacerbation: Secondary | ICD-10-CM | POA: Diagnosis not present

## 2019-12-07 DIAGNOSIS — F17210 Nicotine dependence, cigarettes, uncomplicated: Secondary | ICD-10-CM | POA: Diagnosis not present

## 2019-12-07 DIAGNOSIS — L84 Corns and callosities: Secondary | ICD-10-CM | POA: Diagnosis not present

## 2019-12-07 DIAGNOSIS — M19079 Primary osteoarthritis, unspecified ankle and foot: Secondary | ICD-10-CM | POA: Diagnosis not present

## 2019-12-07 DIAGNOSIS — D692 Other nonthrombocytopenic purpura: Secondary | ICD-10-CM | POA: Diagnosis not present

## 2019-12-07 DIAGNOSIS — Z299 Encounter for prophylactic measures, unspecified: Secondary | ICD-10-CM | POA: Diagnosis not present

## 2019-12-10 DIAGNOSIS — M5416 Radiculopathy, lumbar region: Secondary | ICD-10-CM | POA: Insufficient documentation

## 2019-12-11 DIAGNOSIS — F419 Anxiety disorder, unspecified: Secondary | ICD-10-CM | POA: Diagnosis not present

## 2019-12-11 DIAGNOSIS — M461 Sacroiliitis, not elsewhere classified: Secondary | ICD-10-CM | POA: Diagnosis not present

## 2019-12-11 DIAGNOSIS — M545 Low back pain: Secondary | ICD-10-CM | POA: Diagnosis not present

## 2019-12-11 DIAGNOSIS — Z79891 Long term (current) use of opiate analgesic: Secondary | ICD-10-CM | POA: Diagnosis not present

## 2019-12-13 DIAGNOSIS — J80 Acute respiratory distress syndrome: Secondary | ICD-10-CM | POA: Diagnosis not present

## 2019-12-13 DIAGNOSIS — A419 Sepsis, unspecified organism: Secondary | ICD-10-CM | POA: Diagnosis not present

## 2019-12-13 DIAGNOSIS — J440 Chronic obstructive pulmonary disease with acute lower respiratory infection: Secondary | ICD-10-CM | POA: Diagnosis not present

## 2019-12-13 DIAGNOSIS — J441 Chronic obstructive pulmonary disease with (acute) exacerbation: Secondary | ICD-10-CM | POA: Diagnosis not present

## 2019-12-13 DIAGNOSIS — J9621 Acute and chronic respiratory failure with hypoxia: Secondary | ICD-10-CM | POA: Diagnosis not present

## 2019-12-13 DIAGNOSIS — J189 Pneumonia, unspecified organism: Secondary | ICD-10-CM | POA: Diagnosis not present

## 2019-12-21 DIAGNOSIS — W57XXXA Bitten or stung by nonvenomous insect and other nonvenomous arthropods, initial encounter: Secondary | ICD-10-CM | POA: Diagnosis not present

## 2019-12-21 DIAGNOSIS — F17210 Nicotine dependence, cigarettes, uncomplicated: Secondary | ICD-10-CM | POA: Diagnosis not present

## 2019-12-21 DIAGNOSIS — J069 Acute upper respiratory infection, unspecified: Secondary | ICD-10-CM | POA: Diagnosis not present

## 2019-12-21 DIAGNOSIS — Z299 Encounter for prophylactic measures, unspecified: Secondary | ICD-10-CM | POA: Diagnosis not present

## 2019-12-25 ENCOUNTER — Ambulatory Visit: Payer: Medicare Other | Attending: Internal Medicine

## 2019-12-25 DIAGNOSIS — J069 Acute upper respiratory infection, unspecified: Secondary | ICD-10-CM | POA: Diagnosis not present

## 2019-12-25 DIAGNOSIS — W57XXXA Bitten or stung by nonvenomous insect and other nonvenomous arthropods, initial encounter: Secondary | ICD-10-CM | POA: Diagnosis not present

## 2019-12-25 DIAGNOSIS — F17210 Nicotine dependence, cigarettes, uncomplicated: Secondary | ICD-10-CM | POA: Diagnosis not present

## 2019-12-25 DIAGNOSIS — Z299 Encounter for prophylactic measures, unspecified: Secondary | ICD-10-CM | POA: Diagnosis not present

## 2019-12-25 DIAGNOSIS — Z23 Encounter for immunization: Secondary | ICD-10-CM

## 2019-12-25 NOTE — Progress Notes (Signed)
   Covid-19 Vaccination Clinic  Name:  Paula Bailey    MRN: [id number] DOB: 05-28-1961  12/25/2019  Paula Bailey was observed post Covid-19 immunization for 15 minutes without incident. She was provided with Vaccine Information Sheet and instruction to access the V-Safe system.   Paula Bailey was instructed to call 911 with any severe reactions post vaccine: Marland Kitchen Difficulty breathing  . Swelling of face and throat  . A fast heartbeat  . A bad rash all over body  . Dizziness and weakness   Immunizations Administered    Name Date Dose VIS Date Route   Moderna COVID-19 Vaccine 12/25/2019  9:19 AM 0.5 mL 07/2019 Intramuscular   Manufacturer: Moderna   Lot: [id number]   NDC: 03491-791-50

## 2019-12-26 DIAGNOSIS — F329 Major depressive disorder, single episode, unspecified: Secondary | ICD-10-CM | POA: Diagnosis not present

## 2019-12-26 DIAGNOSIS — E7800 Pure hypercholesterolemia, unspecified: Secondary | ICD-10-CM | POA: Diagnosis not present

## 2020-01-03 ENCOUNTER — Ambulatory Visit: Payer: Medicare Other | Admitting: Podiatry

## 2020-01-10 DIAGNOSIS — M542 Cervicalgia: Secondary | ICD-10-CM | POA: Diagnosis not present

## 2020-01-10 DIAGNOSIS — R05 Cough: Secondary | ICD-10-CM | POA: Diagnosis not present

## 2020-01-10 DIAGNOSIS — Z299 Encounter for prophylactic measures, unspecified: Secondary | ICD-10-CM | POA: Diagnosis not present

## 2020-01-10 DIAGNOSIS — F17210 Nicotine dependence, cigarettes, uncomplicated: Secondary | ICD-10-CM | POA: Diagnosis not present

## 2020-01-21 DIAGNOSIS — E8940 Asymptomatic postprocedural ovarian failure: Secondary | ICD-10-CM | POA: Diagnosis not present

## 2020-01-22 ENCOUNTER — Other Ambulatory Visit: Payer: Self-pay

## 2020-01-22 ENCOUNTER — Ambulatory Visit (INDEPENDENT_AMBULATORY_CARE_PROVIDER_SITE_OTHER): Payer: Medicare Other | Admitting: Podiatry

## 2020-01-22 DIAGNOSIS — M79672 Pain in left foot: Secondary | ICD-10-CM

## 2020-01-22 DIAGNOSIS — Q828 Other specified congenital malformations of skin: Secondary | ICD-10-CM | POA: Diagnosis not present

## 2020-01-22 DIAGNOSIS — M21619 Bunion of unspecified foot: Secondary | ICD-10-CM

## 2020-01-22 NOTE — Patient Instructions (Signed)
Keep moisturizer on the feet daily, but do not apply between the toes Wear the pads to take the pressure off of the ball of the foot.

## 2020-01-24 ENCOUNTER — Other Ambulatory Visit: Payer: Self-pay | Admitting: Internal Medicine

## 2020-01-24 ENCOUNTER — Emergency Department (HOSPITAL_COMMUNITY)
Admission: EM | Admit: 2020-01-24 | Discharge: 2020-01-24 | Disposition: A | Discharge status: Home / Self Care | Payer: Medicare Other | Attending: Emergency Medicine | Admitting: Emergency Medicine

## 2020-01-24 ENCOUNTER — Other Ambulatory Visit: Payer: Self-pay

## 2020-01-24 ENCOUNTER — Emergency Department (HOSPITAL_COMMUNITY): Payer: Medicare Other

## 2020-01-24 ENCOUNTER — Encounter (HOSPITAL_COMMUNITY): Payer: Self-pay

## 2020-01-24 DIAGNOSIS — M542 Cervicalgia: Secondary | ICD-10-CM | POA: Insufficient documentation

## 2020-01-24 DIAGNOSIS — R05 Cough: Secondary | ICD-10-CM | POA: Diagnosis not present

## 2020-01-24 DIAGNOSIS — F17210 Nicotine dependence, cigarettes, uncomplicated: Secondary | ICD-10-CM | POA: Diagnosis not present

## 2020-01-24 DIAGNOSIS — R490 Dysphonia: Secondary | ICD-10-CM | POA: Insufficient documentation

## 2020-01-24 LAB — BASIC METABOLIC PANEL
Anion gap: 10 (ref 5–15)
BUN: 8 mg/dL (ref 6–20)
CO2: 25 mmol/L (ref 22–32)
Calcium: 8.7 mg/dL — ABNORMAL LOW (ref 8.9–10.3)
Chloride: 105 mmol/L (ref 98–111)
Creatinine, Ser: 0.59 mg/dL (ref 0.44–1.00)
GFR calc Af Amer: >60 mL/min (ref >60)
GFR calc non Af Amer: >60 mL/min (ref >60)
Glucose, Bld: 86 mg/dL (ref 70–99)
Potassium: 3.8 mmol/L (ref 3.5–5.1)
Sodium: 140 mmol/L (ref 135–145)

## 2020-01-24 LAB — CBC WITH DIFFERENTIAL/PLATELET
Abs Immature Granulocytes: 0.03 10*3/uL (ref 0.00–0.07)
Basophils Absolute: 0.1 10*3/uL (ref 0.0–0.1)
Basophils Relative: 1 %
Eosinophils Absolute: 0.1 10*3/uL (ref 0.0–0.5)
Eosinophils Relative: 1 %
HCT: 44.6 % (ref 36.0–46.0)
Hemoglobin: 13.9 g/dL (ref 12.0–15.0)
Immature Granulocytes: 0 %
Lymphocytes Relative: 33 %
Lymphs Abs: 2.7 10*3/uL (ref 0.7–4.0)
MCH: 29.2 pg (ref 26.0–34.0)
MCHC: 31.2 g/dL (ref 30.0–36.0)
MCV: 93.7 fL (ref 80.0–100.0)
Monocytes Absolute: 0.5 10*3/uL (ref 0.1–1.0)
Monocytes Relative: 6 %
Neutro Abs: 4.9 10*3/uL (ref 1.7–7.7)
Neutrophils Relative %: 59 %
Platelets: 193 10*3/uL (ref 150–400)
RBC: 4.76 MIL/uL (ref 3.87–5.11)
RDW: 13.4 % (ref 11.5–15.5)
WBC: 8.2 10*3/uL (ref 4.0–10.5)
nRBC: 0 % (ref 0.0–0.2)

## 2020-01-24 MED ORDER — SODIUM CHLORIDE 0.9 % IV SOLN: Freq: Once | INTRAVENOUS | Status: AC

## 2020-01-24 MED ORDER — FENTANYL CITRATE (PF) 100 MCG/2ML IJ SOLN
50.0000 ug | INTRAMUSCULAR | Status: DC | PRN
  Administered 2020-01-24: 50 ug via INTRAVENOUS
  Filled 2020-01-24: qty 2

## 2020-01-24 MED ORDER — IOHEXOL 300 MG/ML  SOLN
75.0000 mL | Freq: Once | INTRAMUSCULAR | Status: AC | PRN
  Administered 2020-01-24: 75 mL via INTRAVENOUS

## 2020-01-24 NOTE — ED Triage Notes (Signed)
Pt reports r sided neck pain, hoarseness, and fatigue x 2 weeks.

## 2020-01-24 NOTE — Discharge Instructions (Signed)
Call and make an appointment to follow-up with the ENT Dr. Christia Reading, or follow-up upon his partners.

## 2020-01-24 NOTE — ED Provider Notes (Signed)
[facility] EMERGENCY DEPARTMENT Provider Note   CSN: [id number] Arrival date & time: 01/24/20  1023     History Chief Complaint  Patient presents with  . Neck Pain    Paula Bailey is a 59 y.o. female.  Patient with history of cigarette smoking, ards, pneumonia presents with persistent hoarseness, mild cough and right neck pain for the past 2 weeks.  Patient has been given steroids and supportive care however symptoms persist.  No weight loss.  No cancer history known.  No fevers or chills.        Past Medical History:  Diagnosis Date  . ARDS (adult respiratory distress syndrome) (HCC)   . Dyslipidemia     Patient Active Problem List   Diagnosis Date Noted  . Lumbar radiculopathy 12/10/2019  . Sepsis due to undetermined organism (HCC) 10/12/2019  . Chronic pain syndrome 10/12/2019  . Lobar pneumonia (HCC) 10/12/2019  . CAP (community acquired pneumonia) 10/11/2019  . Leukocytosis 10/11/2019  . Hyponatremia 10/11/2019  . Elevated brain natriuretic peptide (BNP) level 10/11/2019  . Depression 10/11/2019  . Tobacco abuse 10/11/2019  . Acute respiratory failure with hypoxia (HCC) 10/11/2019  . SIRS (systemic inflammatory response syndrome) (HCC) 10/11/2019  . Sepsis (HCC) 10/11/2019  . MDD (major depressive disorder), recurrent, in full remission (HCC) 09/20/2019  . Anxiety 09/20/2019  . Current moderate episode of major depressive disorder without prior episode (HCC) 09/27/2017    Past Surgical History:  Procedure Laterality Date  . ABDOMINAL HYSTERECTOMY    . CHOLECYSTECTOMY    . TONSILLECTOMY       OB History   No obstetric history on file.     Family History  Problem Relation Age of Onset  . CAD Father   . CAD Brother     Social History   Tobacco Use  . Smoking status: Current Every Day Smoker    Packs/day: 0.50    Years: 40.00    Pack years: 20.00    Start date: 11/13/1981  . Smokeless tobacco: Never Used  Substance Use Topics  . Alcohol  use: No  . Drug use: Not Currently    Home Medications Prior to Admission medications   Medication Sig Start Date End Date Taking? Authorizing Provider  alendronate (FOSAMAX) 70 MG tablet Take 70 mg by mouth once a week. 08/26/17   [provider]  ALPRAZolam Prudy Feeler) 0.5 MG tablet Take 0.25 mg by mouth at bedtime.  07/11/17   [provider]  amitriptyline (ELAVIL) 10 MG tablet Take 20 mg by mouth at bedtime. 09/28/19   [provider]  Earlie Server 025-42 MCG/INH AEPB INHALE ONE PUFF EVERY DAY 01/24/20   Charlott Holler, MD  Doxepin HCl 5 % CREA doxepin 5 % topical cream  APPLY A THIN LAYER TO THE AFFECTED AREA(S) BY TOPICAL ROUTE 4 TIMES PER DAY WITH AN INTERVAL OF AT LEAST 3-4 HOURS BETWEEN APPLICATIONS    [provider]  doxycycline (VIBRA-TABS) 100 MG tablet Take 100 mg by mouth 2 (two) times daily. 12/21/19   [provider]  fluticasone (FLONASE) 50 MCG/ACT nasal spray Allergy Relief (fluticasone) 50 mcg/actuation nasal spray,suspension  Spray 1 spray every day by intranasal route.    [provider]  gabapentin (NEURONTIN) 300 MG capsule Take 600 mg by mouth 3 (three) times daily.  08/26/17   [provider]  HYDROcodone-homatropine (HYCODAN) 5-1.5 MG/5ML syrup hydrocodone-homatropine 5 mg-1.5 mg/5 mL oral syrup  TAKE 1 TEASPOONFUL ( ) BY MOUTH EVERY 6 HOURS AS  NEEDED    [provider]  ibuprofen (ADVIL) 800 MG tablet Take 800 mg by mouth every 8 (eight) hours as needed. 01/10/20   [provider]  INCRUSE ELLIPTA 62.5 MCG/INH AEPB INHALE ONE PUFF EVERY DAY 01/24/20   Charlott Holler, MD  methocarbamol (ROBAXIN) 500 MG tablet methocarbamol 500 mg tablet  TAKE 1 TABLET BY MOUTH THREE TIMES DAILY (decreased DOSE)    [provider]  oxyCODONE-acetaminophen (PERCOCET) 7.5-325 MG tablet Take 1 tablet by mouth every 8 (eight) hours as needed for moderate pain or severe pain.  09/08/17   [provider]  predniSONE (DELTASONE) 5 MG tablet prednisone 5 mg tablets in a dose pack  TAKE AS DIRECTED    [provider]  sertraline (ZOLOFT) 100 MG tablet Take 1.5 tablets (150 mg total) by mouth daily. 12/18/19   [name], MD  simvastatin (ZOCOR) 10 MG tablet Take 10 mg by mouth at bedtime. 10/01/19   [provider]  VENTOLIN HFA 108 (90 Base) MCG/ACT inhaler Inhale 1-2 puffs into the lungs every 4 (four) hours as needed for wheezing or shortness of breath.  08/26/17   [provider]    Allergies    Patient has no known allergies.  Review of Systems   Review of Systems  Constitutional: Negative for chills and fever.  HENT: Negative for congestion.   Eyes: Negative for visual disturbance.  Respiratory: Negative for shortness of breath.   Cardiovascular: Negative for chest pain.  Gastrointestinal: Negative for abdominal pain and vomiting.  Genitourinary: Negative for dysuria and flank pain.  Musculoskeletal: Negative for back pain, neck pain and neck stiffness.  Skin: Negative for rash.  Neurological: Negative for light-headedness and headaches.    Physical Exam Updated Vital Signs BP (!) 164/79 (BP Location: Left Arm)   Pulse 63   Temp 98.2 F (36.8 C) (Oral)   Resp 16   Ht 5\' 3"  (1.6 m)   Wt 65.8 kg   SpO2 99%   BMI 25.69 kg/m   Physical Exam Vitals and nursing note reviewed.  Constitutional:      Appearance: She is well-developed.  HENT:     Head: Normocephalic and atraumatic.     Comments: No significant lymphadenopathy appreciated anterior cervical or supraclavicular region, mild tenderness right proximal sternocleidomastoid, no external sign of infection.  No trismus.  No obvious masses posterior pharynx. Eyes:     General:        Right eye: No discharge.        Left eye: No discharge.     Conjunctiva/sclera: Conjunctivae normal.  Neck:     Trachea: No tracheal deviation.  Cardiovascular:     Rate and Rhythm: Normal rate  and regular rhythm.  Pulmonary:     Effort: Pulmonary effort is normal.     Breath sounds: Normal breath sounds.  Abdominal:     General: There is no distension.     Palpations: Abdomen is soft.     Tenderness: There is no abdominal tenderness. There is no guarding.  Musculoskeletal:     Cervical back: Normal range of motion and neck supple.  Skin:    General: Skin is warm.     Findings: No rash.  Neurological:     Mental Status: She is alert and oriented to person, place, and time.  Psychiatric:        Mood and Affect: Mood is anxious.     ED Results / Procedures / Treatments  Labs (all labs ordered are listed, but only abnormal results are displayed) Labs Reviewed  CBC WITH DIFFERENTIAL/PLATELET  BASIC METABOLIC PANEL    EKG None  Radiology No results found.  Procedures Procedures (including critical care time)  Medications Ordered in ED Medications  fentaNYL (SUBLIMAZE) injection 50 mcg (50 mcg Intravenous Given 01/24/20 1502)  0.9 %  sodium chloride infusion ( Intravenous Stopped 01/24/20 1400)    ED Course  I have reviewed the triage vital signs and the nursing notes.  Pertinent labs & imaging results that were available during my care of the patient were reviewed by me and considered in my medical decision making (see chart for details).    MDM Rules/Calculators/A&P                      Patient presents with worsening hoarseness and right neck pain, chronic smoking history.  Discussed CT scan soft tissue neck to look for any signs of masses or reason for her persistent symptoms.  General blood work ordered.  Discussed smoking cessation. Initial blood work results showed normal white blood cell count, normal hemoglobin, normal platelets. Reassessment patient requesting pain meds.  Patient care be signed out to follow-up CT scan results.   Final Clinical Impression(s) / ED Diagnoses Final diagnoses:  Hoarse voice quality  Neck pain on right side     Rx / DC Orders ED Discharge Orders    None       Elnora Morrison, MD 01/24/20 1517

## 2020-01-29 NOTE — Progress Notes (Signed)
Subjective:   Patient ID: Paula Bailey, female   DOB: 59 y.o.   MRN: [id number]   HPI 59 year old female presents the office for concerns of painful lesions on her left foot pointing to submetatarsal 5.  She thinks this may be a callus but unsure.  This is on the last 4 months has been getting worse.  Denies any open sores or any drainage.  No recent treatment.  Overall she does have dry skin.  No other concerns today.   Review of Systems  All other systems reviewed and are negative.  Past Medical History:  Diagnosis Date  . ARDS (adult respiratory distress syndrome) (HCC)   . Dyslipidemia     Past Surgical History:  Procedure Laterality Date  . ABDOMINAL HYSTERECTOMY    . CHOLECYSTECTOMY    . TONSILLECTOMY       Current Outpatient Medications:  .  alendronate (FOSAMAX) 70 MG tablet, Take 70 mg by mouth once a week., Disp: , Rfl: 6 .  ALPRAZolam (XANAX) 0.5 MG tablet, Take 0.25 mg by mouth at bedtime. , Disp: , Rfl: 0 .  amitriptyline (ELAVIL) 10 MG tablet, Take 20 mg by mouth at bedtime., Disp: , Rfl:  .  fluticasone (FLONASE) 50 MCG/ACT nasal spray, Allergy Relief (fluticasone) 50 mcg/actuation nasal spray,suspension  Spray 1 spray every day by intranasal route., Disp: , Rfl:  .  gabapentin (NEURONTIN) 300 MG capsule, Take 600 mg by mouth 3 (three) times daily. , Disp: , Rfl: 2 .  ibuprofen (ADVIL) 800 MG tablet, Take 800 mg by mouth every 8 (eight) hours as needed., Disp: , Rfl:  .  oxyCODONE-acetaminophen (PERCOCET) 7.5-325 MG tablet, Take 1 tablet by mouth every 6 (six) hours as needed for moderate pain or severe pain. , Disp: , Rfl: 0 .  sertraline (ZOLOFT) 100 MG tablet, Take 1.5 tablets (150 mg total) by mouth daily., Disp: 135 tablet, Rfl: 0 .  simvastatin (ZOCOR) 10 MG tablet, Take 10 mg by mouth at bedtime., Disp: , Rfl:  .  VENTOLIN HFA 108 (90 Base) MCG/ACT inhaler, Inhale 1-2 puffs into the lungs every 4 (four) hours as needed for wheezing or shortness of breath. ,  Disp: , Rfl: 12 .  BREO ELLIPTA 200-25 MCG/INH AEPB, INHALE ONE PUFF EVERY DAY, Disp: 60 each, Rfl: 2 .  INCRUSE ELLIPTA 62.5 MCG/INH AEPB, INHALE ONE PUFF EVERY DAY, Disp: 30 each, Rfl: 2  No Known Allergies       Objective:  Physical Exam  General: NAD  Dermatological: Thick hyperkeratotic tissue left foot submetatarsal 5.  No underlying ulceration drainage or any signs of infection.  No foreign body identified.  Dry, xerotic skin present bilaterally.  Vascular: Dorsalis Pedis artery and Posterior Tibial artery pedal pulses are 2/4 bilateral with immedate capillary fill time.  There is no pain with calf compression, swelling, warmth, erythema.   Neruologic: Grossly intact via light touch bilateral.   Musculoskeletal: Tailor's bunion present bilaterally left side worse than right and prominence of the fifth metatarsal head.  Muscular strength 5/5 in all groups tested bilateral.  Gait: Unassisted, Nonantalgic.       Assessment:   59 year old female hyperkeratotic lesion, dry skin     Plan:  -Treatment options discussed including all alternatives, risks, and complications -Etiology of symptoms were discussed -Debrided hyperkeratotic tissue left foot without any complications or bleeding.  Recommend moisturizer daily and I dispensed miracle foot cream for her.  Offloading pads dispensed.  Consider an accommodative insert if needed.  Trula Slade DPM

## 2020-02-13 NOTE — Progress Notes (Signed)
Virtual Visit via Telephone Note  I connected with Paula Bailey on 02/18/20 at 10:40 AM EDT by telephone and verified that I am speaking with the correct person using two identifiers.   I discussed the limitations, risks, security and privacy concerns of performing an evaluation and management service by telephone and the availability of in person appointments. I also discussed with the patient that there may be a patient responsible charge related to this service. The patient expressed understanding and agreed to proceed.      I discussed the assessment and treatment plan with the patient. The patient was provided an opportunity to ask questions and all were answered. The patient agreed with the plan and demonstrated an understanding of the instructions.   The patient was advised to call back or seek an in-person evaluation if the symptoms worsen or if the condition fails to improve as anticipated.  I provided 8 minutes of non-face-to-face time during this encounter.   Location: patient- daughter's home, provider- office   Norman Clay, MD   [facility] MD/PA/NP OP Progress Note  02/18/2020 10:37 AM Paula Bailey  MRN:  [id number]  Chief Complaint:  Chief Complaint    Follow-up; Depression     HPI:  This is a follow-up appointment for depression.  She states that she has been doing very well.  She believes that the current medications are working very well for the patient.  She was able to have her hair done.  She takes a bath twice a week, and she goes outside regularly.  Although she completed physical therapy sessions, she has been doing it on her own.  She reports good relationship with her daughter, who lives nearby.  Her son is in [place].  She denies feeling depressed.  She has good appetite and concentration.  She has good energy.  She denies SI.  Although she feels anxious at times, she denies any concern.  She denies panic attacks.    Visit Diagnosis:    ICD-10-CM   1. MDD  (major depressive disorder), recurrent, in full remission ([place])  F33.42 sertraline (ZOLOFT) 100 MG tablet  2. Anxiety  F41.9 sertraline (ZOLOFT) 100 MG tablet    Past Psychiatric History: Please see initial evaluation for full details. I have reviewed the history. No updates at this time.     Past Medical History:  Past Medical History:  Diagnosis Date  . ARDS (adult respiratory distress syndrome) ([place])   . Dyslipidemia     Past Surgical History:  Procedure Laterality Date  . ABDOMINAL HYSTERECTOMY    . CHOLECYSTECTOMY    . TONSILLECTOMY      Family Psychiatric History: Please see initial evaluation for full details. I have reviewed the history. No updates at this time.     Family History:  Family History  Problem Relation Age of Onset  . CAD Father   . CAD Brother     Social History:  Social History   Socioeconomic History  . Marital status: Divorced    Spouse name: Not on file  . Number of children: Not on file  . Years of education: Not on file  . Highest education level: Not on file  Occupational History  . Not on file  Tobacco Use  . Smoking status: Current Every Day Smoker    Packs/day: 0.50    Years: 40.00    Pack years: 20.00    Start date: 11/13/1981  . Smokeless tobacco: Never Used  Vaping Use  . Vaping Use: Never  used  Substance and Sexual Activity  . Alcohol use: No  . Drug use: Not Currently  . Sexual activity: Not Currently  Other Topics Concern  . Not on file  Social History Narrative  . Not on file   Social Determinants of Health   Financial Resource Strain:   . Difficulty of Paying Living Expenses:   Food Insecurity:   . Worried About Programme researcher, broadcasting/film/video in the Last Year:   . Barista in the Last Year:   Transportation Needs:   . Freight forwarder (Medical):   Marland Kitchen Lack of Transportation (Non-Medical):   Physical Activity:   . Days of Exercise per Week:   . Minutes of Exercise per Session:   Stress:   . Feeling of  Stress :   Social Connections:   . Frequency of Communication with Friends and Family:   . Frequency of Social Gatherings with Friends and Family:   . Attends Religious Services:   . Active Member of Clubs or Organizations:   . Attends Banker Meetings:   Marland Kitchen Marital Status:     Allergies: No Known Allergies  Metabolic Disorder Labs: No results found for: HGBA1C, MPG No results found for: PROLACTIN Lab Results  Component Value Date   TRIG 240 (H) 10/14/2019   No results found for: TSH  Therapeutic Level Labs: No results found for: LITHIUM No results found for: VALPROATE No components found for:  CBMZ  Current Medications: Current Outpatient Medications  Medication Sig Dispense Refill  . alendronate (FOSAMAX) 70 MG tablet Take 70 mg by mouth once a week.  6  . ALPRAZolam (XANAX) 0.5 MG tablet Take 0.25 mg by mouth at bedtime.   0  . amitriptyline (ELAVIL) 10 MG tablet Take 20 mg by mouth at bedtime.    Marland Kitchen BREO ELLIPTA 200-25 MCG/INH AEPB INHALE ONE PUFF EVERY DAY 60 each 2  . fluticasone (FLONASE) 50 MCG/ACT nasal spray Allergy Relief (fluticasone) 50 mcg/actuation nasal spray,suspension  Spray 1 spray every day by intranasal route.    . gabapentin (NEURONTIN) 300 MG capsule Take 600 mg by mouth 3 (three) times daily.   2  . ibuprofen (ADVIL) 800 MG tablet Take 800 mg by mouth every 8 (eight) hours as needed.    . INCRUSE ELLIPTA 62.5 MCG/INH AEPB INHALE ONE PUFF EVERY DAY 30 each 2  . oxyCODONE-acetaminophen (PERCOCET) 7.5-325 MG tablet Take 1 tablet by mouth every 6 (six) hours as needed for moderate pain or severe pain.   0  . sertraline (ZOLOFT) 100 MG tablet Take 1.5 tablets (150 mg total) by mouth daily. 135 tablet 1  . simvastatin (ZOCOR) 10 MG tablet Take 10 mg by mouth at bedtime.    . VENTOLIN HFA 108 (90 Base) MCG/ACT inhaler Inhale 1-2 puffs into the lungs every 4 (four) hours as needed for wheezing or shortness of breath.   12   No current  facility-administered medications for this visit.     Musculoskeletal: Strength & Muscle Tone: N/A Gait & Station: N/A Patient leans: N/A  Psychiatric Specialty Exam: Review of Systems  Psychiatric/Behavioral: Negative for agitation, behavioral problems, confusion, decreased concentration, dysphoric mood, hallucinations, self-injury, sleep disturbance and suicidal ideas. The patient is nervous/anxious. The patient is not hyperactive.   All other systems reviewed and are negative.   There were no vitals taken for this visit.There is no height or weight on file to calculate BMI.  General Appearance: NA  Eye Contact:  NA  Speech:  Clear and Coherent  Volume:  Normal  Mood:  good  Affect:  NA  Thought Process:  Coherent  Orientation:  Full (Time, Place, and Person)  Thought Content: Logical   Suicidal Thoughts:  No  Homicidal Thoughts:  No  Memory:  Immediate;   Good  Judgement:  Good  Insight:  Present  Psychomotor Activity:  Normal  Concentration:  Concentration: Good and Attention Span: Good  Recall:  Good  Fund of Knowledge: Good  Language: Good  Akathisia:  No  Handed:  Right  AIMS (if indicated): not done  Assets:  Communication Skills Desire for Improvement  ADL's:  Intact  Cognition: WNL  Sleep:  Good   Screenings:   Assessment and Plan:  Paula Bailey is a 59 y.o. year old female with a history of depression, r/o intellectual disability, who presents for follow up appointment for below.   1. MDD (major depressive disorder), recurrent, in full remission (HCC) 2. Anxiety She denies any significant mood symptoms since her last visit.  We will continue the current dose of medication as maintenance therapy for depression.  Discussed behavioral activation.   # r/o intellectual disability Patient was in a special class as a child.ADL independent.Also it has been requested to bring the records of disability evaluation, her sister has not been able to bring the  one to the office. No behavioral/safety concerns.  Plan I have reviewed and updated plans as below 1.Continuesertraline 150 mg at night  2.Next appointment: 10/11 at 10 AM for 20 mins, phone 4.TSH reportedly normal per patient (not known when it was checked the last time) (She has been on amitriptyline 20 mg qhs, oxycodone, Xanax 0.5 mgdailyas needed for anxietyby other provider)  Past trials of medication:Paxil, duloxetine (drowsiness),bupropion, Xanax  The patient demonstrates the following risk factors for suicide: Chronic risk factors for suicide include:psychiatric disorder ofdepression. Acute risk factorsfor suicide include: unemployment. Protective factorsfor this patient include: positive social support, coping skills and hope for the future. Considering these factors, the overall suicide risk at this point appears to below   [name], MD 02/18/2020, 10:37 AM

## 2020-02-18 ENCOUNTER — Telehealth (INDEPENDENT_AMBULATORY_CARE_PROVIDER_SITE_OTHER): Payer: Medicare Other | Admitting: Psychiatry

## 2020-02-18 ENCOUNTER — Other Ambulatory Visit: Payer: Self-pay

## 2020-02-18 ENCOUNTER — Encounter (HOSPITAL_COMMUNITY): Payer: Self-pay | Admitting: Psychiatry

## 2020-02-18 DIAGNOSIS — F3342 Major depressive disorder, recurrent, in full remission: Secondary | ICD-10-CM | POA: Diagnosis not present

## 2020-02-18 DIAGNOSIS — F419 Anxiety disorder, unspecified: Secondary | ICD-10-CM

## 2020-02-18 MED ORDER — SERTRALINE HCL 100 MG PO TABS: 150.0000 mg | ORAL_TABLET | Freq: Every day | ORAL | 1 refills | Status: DC

## 2020-02-18 NOTE — Patient Instructions (Signed)
1.Continuesertraline 150 mg at night  2.Next appointment: 10/11 at 10 AM

## 2020-03-28 DIAGNOSIS — E7800 Pure hypercholesterolemia, unspecified: Secondary | ICD-10-CM | POA: Diagnosis not present

## 2020-03-28 DIAGNOSIS — F329 Major depressive disorder, single episode, unspecified: Secondary | ICD-10-CM | POA: Diagnosis not present

## 2020-04-15 DIAGNOSIS — R490 Dysphonia: Secondary | ICD-10-CM | POA: Diagnosis not present

## 2020-04-15 DIAGNOSIS — M26623 Arthralgia of bilateral temporomandibular joint: Secondary | ICD-10-CM | POA: Diagnosis not present

## 2020-04-15 DIAGNOSIS — H9203 Otalgia, bilateral: Secondary | ICD-10-CM | POA: Diagnosis not present

## 2020-04-16 ENCOUNTER — Other Ambulatory Visit: Payer: Self-pay | Admitting: Internal Medicine

## 2020-04-16 DIAGNOSIS — E7800 Pure hypercholesterolemia, unspecified: Secondary | ICD-10-CM | POA: Diagnosis not present

## 2020-04-16 DIAGNOSIS — F329 Major depressive disorder, single episode, unspecified: Secondary | ICD-10-CM | POA: Diagnosis not present

## 2020-04-22 DIAGNOSIS — Z299 Encounter for prophylactic measures, unspecified: Secondary | ICD-10-CM | POA: Diagnosis not present

## 2020-04-22 DIAGNOSIS — F17210 Nicotine dependence, cigarettes, uncomplicated: Secondary | ICD-10-CM | POA: Diagnosis not present

## 2020-04-24 ENCOUNTER — Ambulatory Visit: Payer: Medicare Other | Admitting: Podiatry

## 2020-05-21 DIAGNOSIS — Z79891 Long term (current) use of opiate analgesic: Secondary | ICD-10-CM | POA: Diagnosis not present

## 2020-05-21 DIAGNOSIS — M461 Sacroiliitis, not elsewhere classified: Secondary | ICD-10-CM | POA: Diagnosis not present

## 2020-05-21 DIAGNOSIS — R252 Cramp and spasm: Secondary | ICD-10-CM | POA: Diagnosis not present

## 2020-05-21 DIAGNOSIS — G894 Chronic pain syndrome: Secondary | ICD-10-CM | POA: Diagnosis not present

## 2020-05-21 DIAGNOSIS — F419 Anxiety disorder, unspecified: Secondary | ICD-10-CM | POA: Diagnosis not present

## 2020-05-21 DIAGNOSIS — M542 Cervicalgia: Secondary | ICD-10-CM | POA: Diagnosis not present

## 2020-05-21 DIAGNOSIS — M25511 Pain in right shoulder: Secondary | ICD-10-CM | POA: Diagnosis not present

## 2020-05-21 DIAGNOSIS — M545 Low back pain: Secondary | ICD-10-CM | POA: Diagnosis not present

## 2020-05-29 DIAGNOSIS — E7800 Pure hypercholesterolemia, unspecified: Secondary | ICD-10-CM | POA: Diagnosis not present

## 2020-05-29 DIAGNOSIS — F329 Major depressive disorder, single episode, unspecified: Secondary | ICD-10-CM | POA: Diagnosis not present
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
## 2020-06-09 ENCOUNTER — Encounter (HOSPITAL_COMMUNITY): Payer: Self-pay | Admitting: Psychiatry

## 2020-06-09 ENCOUNTER — Telehealth (INDEPENDENT_AMBULATORY_CARE_PROVIDER_SITE_OTHER): Payer: Medicare Other | Admitting: Psychiatry

## 2020-06-09 ENCOUNTER — Other Ambulatory Visit: Payer: Self-pay

## 2020-06-09 DIAGNOSIS — F3342 Major depressive disorder, recurrent, in full remission: Secondary | ICD-10-CM

## 2020-06-09 DIAGNOSIS — F419 Anxiety disorder, unspecified: Secondary | ICD-10-CM | POA: Diagnosis not present

## 2020-06-09 MED ORDER — SERTRALINE HCL 100 MG PO TABS: 150.0000 mg | ORAL_TABLET | Freq: Every day | ORAL | 0 refills | Status: DC

## 2020-06-09 NOTE — Patient Instructions (Signed)
1.Continuesertraline150mg  at night  2.Next appointment:2/7 at 10AM

## 2020-06-17 DIAGNOSIS — M542 Cervicalgia: Secondary | ICD-10-CM | POA: Diagnosis not present

## 2020-06-17 DIAGNOSIS — M25511 Pain in right shoulder: Secondary | ICD-10-CM | POA: Diagnosis not present

## 2020-06-17 DIAGNOSIS — G894 Chronic pain syndrome: Secondary | ICD-10-CM | POA: Diagnosis not present

## 2020-06-17 DIAGNOSIS — F419 Anxiety disorder, unspecified: Secondary | ICD-10-CM | POA: Diagnosis not present

## 2020-06-17 DIAGNOSIS — M545 Low back pain, unspecified: Secondary | ICD-10-CM | POA: Diagnosis not present

## 2020-06-17 DIAGNOSIS — M5459 Other low back pain: Secondary | ICD-10-CM | POA: Diagnosis not present

## 2020-06-17 DIAGNOSIS — M461 Sacroiliitis, not elsewhere classified: Secondary | ICD-10-CM | POA: Diagnosis not present

## 2020-06-17 DIAGNOSIS — R252 Cramp and spasm: Secondary | ICD-10-CM | POA: Diagnosis not present

## 2020-06-17 DIAGNOSIS — Z79891 Long term (current) use of opiate analgesic: Secondary | ICD-10-CM | POA: Diagnosis not present

## 2020-06-27 DIAGNOSIS — F329 Major depressive disorder, single episode, unspecified: Secondary | ICD-10-CM | POA: Diagnosis not present

## 2020-06-27 DIAGNOSIS — E7800 Pure hypercholesterolemia, unspecified: Secondary | ICD-10-CM | POA: Diagnosis not present

## 2020-07-15 DIAGNOSIS — F419 Anxiety disorder, unspecified: Secondary | ICD-10-CM | POA: Diagnosis not present

## 2020-07-15 DIAGNOSIS — R252 Cramp and spasm: Secondary | ICD-10-CM | POA: Diagnosis not present

## 2020-07-15 DIAGNOSIS — M25511 Pain in right shoulder: Secondary | ICD-10-CM | POA: Diagnosis not present

## 2020-07-15 DIAGNOSIS — M542 Cervicalgia: Secondary | ICD-10-CM | POA: Diagnosis not present

## 2020-07-15 DIAGNOSIS — M545 Low back pain, unspecified: Secondary | ICD-10-CM | POA: Diagnosis not present

## 2020-07-15 DIAGNOSIS — M461 Sacroiliitis, not elsewhere classified: Secondary | ICD-10-CM | POA: Diagnosis not present

## 2020-07-15 DIAGNOSIS — Z79891 Long term (current) use of opiate analgesic: Secondary | ICD-10-CM | POA: Diagnosis not present

## 2020-07-15 DIAGNOSIS — M5459 Other low back pain: Secondary | ICD-10-CM | POA: Diagnosis not present

## 2020-07-16 ENCOUNTER — Other Ambulatory Visit: Payer: Self-pay | Admitting: Internal Medicine

## 2020-07-20 ENCOUNTER — Inpatient Hospital Stay (HOSPITAL_COMMUNITY): Payer: Medicare Other

## 2020-07-20 ENCOUNTER — Emergency Department (HOSPITAL_COMMUNITY): Payer: Medicare Other

## 2020-07-20 ENCOUNTER — Other Ambulatory Visit: Payer: Self-pay

## 2020-07-20 ENCOUNTER — Encounter (HOSPITAL_COMMUNITY): Payer: Self-pay

## 2020-07-20 ENCOUNTER — Inpatient Hospital Stay (HOSPITAL_COMMUNITY)
Admission: EM | Admit: 2020-07-20 | Discharge: 2020-07-24 | DRG: 871 | Disposition: A | Discharge status: Home / Self Care | Payer: Medicare Other | Attending: Internal Medicine | Admitting: Internal Medicine

## 2020-07-20 DIAGNOSIS — R059 Cough, unspecified: Secondary | ICD-10-CM | POA: Diagnosis not present

## 2020-07-20 DIAGNOSIS — F1120 Opioid dependence, uncomplicated: Secondary | ICD-10-CM | POA: Diagnosis present

## 2020-07-20 DIAGNOSIS — Z7951 Long term (current) use of inhaled steroids: Secondary | ICD-10-CM

## 2020-07-20 DIAGNOSIS — J811 Chronic pulmonary edema: Secondary | ICD-10-CM | POA: Diagnosis not present

## 2020-07-20 DIAGNOSIS — R739 Hyperglycemia, unspecified: Secondary | ICD-10-CM | POA: Diagnosis not present

## 2020-07-20 DIAGNOSIS — R509 Fever, unspecified: Secondary | ICD-10-CM | POA: Diagnosis not present

## 2020-07-20 DIAGNOSIS — I517 Cardiomegaly: Secondary | ICD-10-CM | POA: Diagnosis not present

## 2020-07-20 DIAGNOSIS — J440 Chronic obstructive pulmonary disease with acute lower respiratory infection: Secondary | ICD-10-CM | POA: Diagnosis present

## 2020-07-20 DIAGNOSIS — Z79899 Other long term (current) drug therapy: Secondary | ICD-10-CM | POA: Diagnosis not present

## 2020-07-20 DIAGNOSIS — Z90710 Acquired absence of both cervix and uterus: Secondary | ICD-10-CM

## 2020-07-20 DIAGNOSIS — J1282 Pneumonia due to coronavirus disease 2019: Secondary | ICD-10-CM | POA: Diagnosis not present

## 2020-07-20 DIAGNOSIS — F32A Depression, unspecified: Secondary | ICD-10-CM | POA: Diagnosis present

## 2020-07-20 DIAGNOSIS — U071 COVID-19: Secondary | ICD-10-CM | POA: Diagnosis present

## 2020-07-20 DIAGNOSIS — J90 Pleural effusion, not elsewhere classified: Secondary | ICD-10-CM | POA: Diagnosis not present

## 2020-07-20 DIAGNOSIS — Z7983 Long term (current) use of bisphosphonates: Secondary | ICD-10-CM | POA: Diagnosis not present

## 2020-07-20 DIAGNOSIS — R0602 Shortness of breath: Secondary | ICD-10-CM | POA: Diagnosis not present

## 2020-07-20 DIAGNOSIS — T380X5A Adverse effect of glucocorticoids and synthetic analogues, initial encounter: Secondary | ICD-10-CM | POA: Diagnosis not present

## 2020-07-20 DIAGNOSIS — R000 Tachycardia, unspecified: Secondary | ICD-10-CM | POA: Diagnosis not present

## 2020-07-20 DIAGNOSIS — E785 Hyperlipidemia, unspecified: Secondary | ICD-10-CM | POA: Diagnosis present

## 2020-07-20 DIAGNOSIS — R6520 Severe sepsis without septic shock: Secondary | ICD-10-CM | POA: Diagnosis present

## 2020-07-20 DIAGNOSIS — G894 Chronic pain syndrome: Secondary | ICD-10-CM | POA: Diagnosis present

## 2020-07-20 DIAGNOSIS — M810 Age-related osteoporosis without current pathological fracture: Secondary | ICD-10-CM | POA: Diagnosis present

## 2020-07-20 DIAGNOSIS — Z9049 Acquired absence of other specified parts of digestive tract: Secondary | ICD-10-CM

## 2020-07-20 DIAGNOSIS — J069 Acute upper respiratory infection, unspecified: Secondary | ICD-10-CM | POA: Diagnosis not present

## 2020-07-20 DIAGNOSIS — R079 Chest pain, unspecified: Secondary | ICD-10-CM | POA: Diagnosis not present

## 2020-07-20 DIAGNOSIS — A419 Sepsis, unspecified organism: Principal | ICD-10-CM | POA: Diagnosis present

## 2020-07-20 DIAGNOSIS — Z885 Allergy status to narcotic agent status: Secondary | ICD-10-CM | POA: Diagnosis not present

## 2020-07-20 DIAGNOSIS — Z716 Tobacco abuse counseling: Secondary | ICD-10-CM | POA: Diagnosis not present

## 2020-07-20 DIAGNOSIS — F17210 Nicotine dependence, cigarettes, uncomplicated: Secondary | ICD-10-CM | POA: Diagnosis present

## 2020-07-20 DIAGNOSIS — R6521 Severe sepsis with septic shock: Secondary | ICD-10-CM

## 2020-07-20 DIAGNOSIS — F419 Anxiety disorder, unspecified: Secondary | ICD-10-CM | POA: Diagnosis present

## 2020-07-20 DIAGNOSIS — J9601 Acute respiratory failure with hypoxia: Secondary | ICD-10-CM | POA: Diagnosis not present

## 2020-07-20 DIAGNOSIS — Z720 Tobacco use: Secondary | ICD-10-CM | POA: Diagnosis present

## 2020-07-20 DIAGNOSIS — J189 Pneumonia, unspecified organism: Secondary | ICD-10-CM | POA: Diagnosis not present

## 2020-07-20 LAB — COMPREHENSIVE METABOLIC PANEL
ALT: 23 U/L (ref 0–44)
AST: 22 U/L (ref 15–41)
Albumin: 3.2 g/dL — ABNORMAL LOW (ref 3.5–5.0)
Alkaline Phosphatase: 75 U/L (ref 38–126)
Anion gap: 8 (ref 5–15)
BUN: 18 mg/dL (ref 6–20)
CO2: 25 mmol/L (ref 22–32)
Calcium: 8.6 mg/dL — ABNORMAL LOW (ref 8.9–10.3)
Chloride: 102 mmol/L (ref 98–111)
Creatinine, Ser: 0.78 mg/dL (ref 0.44–1.00)
GFR, Estimated: >60 mL/min (ref >60)
Glucose, Bld: 158 mg/dL — ABNORMAL HIGH (ref 70–99)
Potassium: 4.6 mmol/L (ref 3.5–5.1)
Sodium: 135 mmol/L (ref 135–145)
Total Bilirubin: 0.3 mg/dL (ref 0.3–1.2)
Total Protein: 7.3 g/dL (ref 6.5–8.1)

## 2020-07-20 LAB — CBC WITH DIFFERENTIAL/PLATELET
Abs Immature Granulocytes: 0.12 10*3/uL — ABNORMAL HIGH (ref 0.00–0.07)
Basophils Absolute: 0.1 10*3/uL (ref 0.0–0.1)
Basophils Relative: 0 %
Eosinophils Absolute: 0 10*3/uL (ref 0.0–0.5)
Eosinophils Relative: 0 %
HCT: 45.6 % (ref 36.0–46.0)
Hemoglobin: 15 g/dL (ref 12.0–15.0)
Immature Granulocytes: 1 %
Lymphocytes Relative: 11 %
Lymphs Abs: 1.9 10*3/uL (ref 0.7–4.0)
MCH: 30.4 pg (ref 26.0–34.0)
MCHC: 32.9 g/dL (ref 30.0–36.0)
MCV: 92.3 fL (ref 80.0–100.0)
Monocytes Absolute: 0.4 10*3/uL (ref 0.1–1.0)
Monocytes Relative: 3 %
Neutro Abs: 14 10*3/uL — ABNORMAL HIGH (ref 1.7–7.7)
Neutrophils Relative %: 85 %
Platelets: 210 10*3/uL (ref 150–400)
RBC: 4.94 MIL/uL (ref 3.87–5.11)
RDW: 14.5 % (ref 11.5–15.5)
WBC: 16.5 10*3/uL — ABNORMAL HIGH (ref 4.0–10.5)
nRBC: 0 % (ref 0.0–0.2)

## 2020-07-20 LAB — FERRITIN: Ferritin: 123 ng/mL (ref 11–307)

## 2020-07-20 LAB — LACTIC ACID, PLASMA
Lactic Acid, Venous: 2.3 mmol/L (ref 0.5–1.9)
Lactic Acid, Venous: 2.8 mmol/L (ref 0.5–1.9)

## 2020-07-20 LAB — RESP PANEL BY RT-PCR (FLU A&B, COVID) ARPGX2
Influenza A by PCR: NEGATIVE
Influenza B by PCR: NEGATIVE
SARS Coronavirus 2 by RT PCR: POSITIVE — AB

## 2020-07-20 LAB — D-DIMER, QUANTITATIVE: D-Dimer, Quant: 2.24 ug/mL-FEU — ABNORMAL HIGH (ref 0.00–0.50)

## 2020-07-20 LAB — C-REACTIVE PROTEIN: CRP: 34.3 mg/dL — ABNORMAL HIGH (ref <1.0)

## 2020-07-20 LAB — GLUCOSE, CAPILLARY: Glucose-Capillary: 164 mg/dL — ABNORMAL HIGH (ref 70–99)

## 2020-07-20 LAB — PROCALCITONIN: Procalcitonin: 6.02 ng/mL

## 2020-07-20 LAB — FIBRINOGEN: Fibrinogen: >800 mg/dL — ABNORMAL HIGH (ref 210–475)

## 2020-07-20 LAB — TRIGLYCERIDES: Triglycerides: 155 mg/dL — ABNORMAL HIGH (ref <150)

## 2020-07-20 LAB — LACTATE DEHYDROGENASE: LDH: 431 U/L — ABNORMAL HIGH (ref 98–192)

## 2020-07-20 MED ORDER — NICOTINE 14 MG/24HR TD PT24
14.0000 mg | MEDICATED_PATCH | Freq: Every day | TRANSDERMAL | Status: DC
  Administered 2020-07-20 – 2020-07-24 (×5): 14 mg via TRANSDERMAL
  Filled 2020-07-20 (×5): qty 1

## 2020-07-20 MED ORDER — THIAMINE HCL 100 MG PO TABS
100.0000 mg | ORAL_TABLET | Freq: Every day | ORAL | Status: DC
  Administered 2020-07-20 – 2020-07-24 (×5): 100 mg via ORAL
  Filled 2020-07-20 (×5): qty 1

## 2020-07-20 MED ORDER — ALBUTEROL SULFATE HFA 108 (90 BASE) MCG/ACT IN AERS
2.0000 | INHALATION_SPRAY | RESPIRATORY_TRACT | Status: DC | PRN
  Filled 2020-07-20: qty 6.7

## 2020-07-20 MED ORDER — ZINC SULFATE 220 (50 ZN) MG PO CAPS
220.0000 mg | ORAL_CAPSULE | Freq: Every day | ORAL | Status: DC
  Administered 2020-07-20 – 2020-07-24 (×5): 220 mg via ORAL
  Filled 2020-07-20 (×5): qty 1

## 2020-07-20 MED ORDER — SERTRALINE HCL 50 MG PO TABS
150.0000 mg | ORAL_TABLET | Freq: Every day | ORAL | Status: DC
  Administered 2020-07-20 – 2020-07-24 (×5): 150 mg via ORAL
  Filled 2020-07-20 (×5): qty 3

## 2020-07-20 MED ORDER — SIMVASTATIN 20 MG PO TABS
10.0000 mg | ORAL_TABLET | Freq: Every day | ORAL | Status: DC
  Administered 2020-07-20 – 2020-07-23 (×4): 10 mg via ORAL
  Filled 2020-07-20 (×4): qty 1

## 2020-07-20 MED ORDER — SODIUM CHLORIDE 0.9 % IV SOLN
100.0000 mg | INTRAVENOUS | Status: AC
  Administered 2020-07-20 (×2): 100 mg via INTRAVENOUS
  Filled 2020-07-20 (×2): qty 20

## 2020-07-20 MED ORDER — SODIUM CHLORIDE 0.9 % IV SOLN
100.0000 mg | Freq: Every day | INTRAVENOUS | Status: AC
  Administered 2020-07-21 – 2020-07-24 (×4): 100 mg via INTRAVENOUS
  Filled 2020-07-20 (×4): qty 20

## 2020-07-20 MED ORDER — ACETAMINOPHEN 650 MG RE SUPP: 650.0000 mg | Freq: Four times a day (QID) | RECTAL | Status: DC | PRN

## 2020-07-20 MED ORDER — ACETAMINOPHEN 325 MG PO TABS
650.0000 mg | ORAL_TABLET | Freq: Four times a day (QID) | ORAL | Status: DC | PRN
  Filled 2020-07-20 (×2): qty 2

## 2020-07-20 MED ORDER — ASCORBIC ACID 500 MG PO TABS
500.0000 mg | ORAL_TABLET | Freq: Every day | ORAL | Status: DC
  Administered 2020-07-20 – 2020-07-24 (×5): 500 mg via ORAL
  Filled 2020-07-20 (×5): qty 1

## 2020-07-20 MED ORDER — GUAIFENESIN-DM 100-10 MG/5ML PO SYRP: 10.0000 mL | ORAL_SOLUTION | ORAL | Status: DC | PRN

## 2020-07-20 MED ORDER — INSULIN ASPART 100 UNIT/ML ~~LOC~~ SOLN
0.0000 [IU] | Freq: Three times a day (TID) | SUBCUTANEOUS | Status: DC
  Administered 2020-07-21: 1 [IU] via SUBCUTANEOUS
  Administered 2020-07-21 (×2): 2 [IU] via SUBCUTANEOUS
  Administered 2020-07-22: 5 [IU] via SUBCUTANEOUS
  Administered 2020-07-22: 2 [IU] via SUBCUTANEOUS
  Administered 2020-07-22: 3 [IU] via SUBCUTANEOUS
  Administered 2020-07-23: 1 [IU] via SUBCUTANEOUS
  Administered 2020-07-23: 2 [IU] via SUBCUTANEOUS
  Administered 2020-07-24: 1 [IU] via SUBCUTANEOUS
  Administered 2020-07-24: 2 [IU] via SUBCUTANEOUS

## 2020-07-20 MED ORDER — ALPRAZOLAM 0.25 MG PO TABS
0.2500 mg | ORAL_TABLET | Freq: Every day | ORAL | Status: DC
  Administered 2020-07-20 – 2020-07-23 (×4): 0.25 mg via ORAL
  Filled 2020-07-20 (×4): qty 1

## 2020-07-20 MED ORDER — UMECLIDINIUM BROMIDE 62.5 MCG/INH IN AEPB
1.0000 | INHALATION_SPRAY | Freq: Every day | RESPIRATORY_TRACT | Status: DC
  Administered 2020-07-21 – 2020-07-24 (×4): 1 via RESPIRATORY_TRACT
  Filled 2020-07-20: qty 7

## 2020-07-20 MED ORDER — METHYLPREDNISOLONE SODIUM SUCC 40 MG IJ SOLR
0.5000 mg/kg | Freq: Two times a day (BID) | INTRAMUSCULAR | Status: AC
  Administered 2020-07-20 – 2020-07-23 (×6): 32.4 mg via INTRAVENOUS
  Filled 2020-07-20 (×6): qty 1

## 2020-07-20 MED ORDER — SODIUM CHLORIDE 0.9 % IV SOLN: 200.0000 mg | Freq: Once | INTRAVENOUS | Status: DC

## 2020-07-20 MED ORDER — ONDANSETRON HCL 4 MG PO TABS: 4.0000 mg | ORAL_TABLET | Freq: Four times a day (QID) | ORAL | Status: DC | PRN

## 2020-07-20 MED ORDER — PANTOPRAZOLE SODIUM 40 MG PO TBEC
40.0000 mg | DELAYED_RELEASE_TABLET | Freq: Every day | ORAL | Status: DC
  Administered 2020-07-20 – 2020-07-24 (×5): 40 mg via ORAL
  Filled 2020-07-20 (×5): qty 1

## 2020-07-20 MED ORDER — IOHEXOL 350 MG/ML SOLN
100.0000 mL | Freq: Once | INTRAVENOUS | Status: AC | PRN
  Administered 2020-07-20: 100 mL via INTRAVENOUS

## 2020-07-20 MED ORDER — SODIUM CHLORIDE 0.9 % IV SOLN: 250.0000 mL | INTRAVENOUS | Status: DC | PRN

## 2020-07-20 MED ORDER — FOLIC ACID 1 MG PO TABS
1.0000 mg | ORAL_TABLET | Freq: Every day | ORAL | Status: DC
  Administered 2020-07-20 – 2020-07-24 (×5): 1 mg via ORAL
  Filled 2020-07-20 (×5): qty 1

## 2020-07-20 MED ORDER — SODIUM CHLORIDE 0.9 % IV SOLN
500.0000 mg | INTRAVENOUS | Status: DC
  Administered 2020-07-20 – 2020-07-22 (×3): 500 mg via INTRAVENOUS
  Filled 2020-07-20 (×3): qty 500

## 2020-07-20 MED ORDER — PREDNISONE 20 MG PO TABS: 50.0000 mg | ORAL_TABLET | Freq: Every day | ORAL | Status: DC

## 2020-07-20 MED ORDER — ACETAMINOPHEN 325 MG PO TABS
650.0000 mg | ORAL_TABLET | Freq: Four times a day (QID) | ORAL | Status: DC | PRN
  Administered 2020-07-20: 650 mg via ORAL

## 2020-07-20 MED ORDER — SODIUM CHLORIDE 0.9 % IV SOLN
1.0000 g | INTRAVENOUS | Status: DC
  Administered 2020-07-20 – 2020-07-23 (×4): 1 g via INTRAVENOUS
  Filled 2020-07-20 (×4): qty 10

## 2020-07-20 MED ORDER — SODIUM CHLORIDE 0.9% FLUSH: 3.0000 mL | INTRAVENOUS | Status: DC | PRN

## 2020-07-20 MED ORDER — HYDROCOD POLST-CPM POLST ER 10-8 MG/5ML PO SUER: 5.0000 mL | Freq: Two times a day (BID) | ORAL | Status: DC | PRN

## 2020-07-20 MED ORDER — BISACODYL 10 MG RE SUPP: 10.0000 mg | Freq: Every day | RECTAL | Status: DC | PRN

## 2020-07-20 MED ORDER — FLUTICASONE FUROATE-VILANTEROL 200-25 MCG/INH IN AEPB
1.0000 | INHALATION_SPRAY | Freq: Every day | RESPIRATORY_TRACT | Status: DC
  Administered 2020-07-21 – 2020-07-24 (×4): 1 via RESPIRATORY_TRACT
  Filled 2020-07-20: qty 28

## 2020-07-20 MED ORDER — OXYCODONE-ACETAMINOPHEN 7.5-325 MG PO TABS
1.0000 | ORAL_TABLET | Freq: Four times a day (QID) | ORAL | Status: DC | PRN
  Administered 2020-07-20 – 2020-07-24 (×12): 1 via ORAL
  Filled 2020-07-20 (×12): qty 1

## 2020-07-20 MED ORDER — SODIUM CHLORIDE 0.9 % IV SOLN: INTRAVENOUS | Status: AC

## 2020-07-20 MED ORDER — SODIUM CHLORIDE 0.9% FLUSH
3.0000 mL | Freq: Two times a day (BID) | INTRAVENOUS | Status: DC
  Administered 2020-07-20 – 2020-07-24 (×7): 3 mL via INTRAVENOUS

## 2020-07-20 MED ORDER — SODIUM CHLORIDE 0.9 % IV SOLN: 100.0000 mg | Freq: Every day | INTRAVENOUS | Status: DC

## 2020-07-20 MED ORDER — INSULIN ASPART 100 UNIT/ML ~~LOC~~ SOLN
0.0000 [IU] | Freq: Every day | SUBCUTANEOUS | Status: DC
  Administered 2020-07-23: 3 [IU] via SUBCUTANEOUS

## 2020-07-20 MED ORDER — AMITRIPTYLINE HCL 10 MG PO TABS
20.0000 mg | ORAL_TABLET | Freq: Every day | ORAL | Status: DC
  Administered 2020-07-20 – 2020-07-23 (×4): 20 mg via ORAL
  Filled 2020-07-20 (×5): qty 2

## 2020-07-20 MED ORDER — GABAPENTIN 300 MG PO CAPS
600.0000 mg | ORAL_CAPSULE | Freq: Three times a day (TID) | ORAL | Status: DC
  Administered 2020-07-20 – 2020-07-24 (×12): 600 mg via ORAL
  Filled 2020-07-20 (×12): qty 2

## 2020-07-20 MED ORDER — ENOXAPARIN SODIUM 40 MG/0.4ML ~~LOC~~ SOLN
40.0000 mg | SUBCUTANEOUS | Status: DC
  Administered 2020-07-20 – 2020-07-23 (×4): 40 mg via SUBCUTANEOUS
  Filled 2020-07-20 (×4): qty 0.4

## 2020-07-20 MED ORDER — ONDANSETRON HCL 4 MG/2ML IJ SOLN: 4.0000 mg | Freq: Four times a day (QID) | INTRAMUSCULAR | Status: DC | PRN

## 2020-07-20 MED ORDER — ADULT MULTIVITAMIN W/MINERALS CH
1.0000 | ORAL_TABLET | Freq: Every day | ORAL | Status: DC
  Administered 2020-07-20 – 2020-07-24 (×5): 1 via ORAL
  Filled 2020-07-20 (×5): qty 1

## 2020-07-20 MED ORDER — SODIUM CHLORIDE 0.9% FLUSH: 3.0000 mL | Freq: Two times a day (BID) | INTRAVENOUS | Status: DC

## 2020-07-20 NOTE — ED Notes (Signed)
CRITICAL VALUE ALERT  Critical Value: COVID +  Date & Time Notied:  07/20/20 1234 Provider Notified: Joylene Grapes  Orders Received/Actions taken: none

## 2020-07-20 NOTE — H&P (Signed)
Patient Demographics:    Paula Bailey, is a 59 y.o. female  MRN: [id number]   DOB - [birth year]  Admit Date - 07/20/2020  Outpatient Primary MD for the patient is Ignatius Specking, MD   Assessment & Plan:    Principal Problem:   Severe Sepsis due to pneumonia Active Problems:   Acute respiratory failure with hypoxia (HCC)   Acute respiratory disease due to COVID-19 virus   Community acquired pneumonia -Rt > Lt   Pneumonia due to COVID-19 virus   Anxiety   Depression   Tobacco abuse   Chronic pain syndrome    1) acute hypoxic respiratory failure--- patient requiring up to 4 L of oxygen via nasal cannula on presentation to the ED, -We will get CTA chest to rule out underlying PE given elevated D-dimer, acute onset of shortness of breath tachycardia and tachypnea with hypoxia -- --Please note that patient had acute respiratory failure in February 2021, she was actually intubated on 10/12/2019 and extubated on 10/16/2019--- she has done well since discharge without need for home O2 until now  2)Severe Sepsis secondary to community-acquired pneumonia--- -in ED WBC 16.5 , chest x-ray suggestive of pneumonia (Rt > Lt) --- procalcitonin is elevated at 6.0,  -Please note that patient presented with hypoxia and elevated lactic acid at 2.8, -Patient meets criteria on admission for severe sepsis -Empirically treat with Rocephin and azithromycin pending blood cultures as well as bronchodilators and mucolytics  -COVID-19 infection will not explain significant leukocytosis and significant elevation of procalcitonin, chest x-ray pattern also suggestive of possible bacterial pneumonia  3) Acute hypoxic respiratory failure secondary to COVID-19 infection/Pneumonia--- The treatment plan and use of medications  for treatment of  COVID-19 infection and possible side effects were discussed with patient -----Patient verbalizes understanding and agrees to treatment protocols   --Patient is positive for COVID-19 infection, chest x-ray with findings of infiltrates/opacities,  patient is tachypneic/hypoxic and requiring continuous supplemental oxygen---patient meets criteria for initiation of Remdesivir AND Steroid therapy per protocol  -Elevated inflammatory markers noted including-- Elevated CRP of 34.3, D-dimer of 2.24, LDH of 431, -fibrinogen greater than 800 -COVID-19 positive, influenza negative --Check and trend inflammatory markers including D-dimer, ferritin and  CRP---also follow CBC and CMP --Supplemental oxygen to keep O2 sats above 93% -Follow serial chest x-rays and ABGs as indicated --- Encourage prone positioning for More than 16 hours/day in increments of 2 to 3 hours at a time if able to tolerate --Attempt to maintain euvolemic state --Zinc and vitamin C as ordered -Albuterol inhaler as needed -Accu-Cheks/fingersticks while on high-dose steroids -PPI while on high-dose steroids Patient completed 2 doses of Moderna COVID-19 vaccine on 12/25/2019--- -patient also got her flu shot  4) COPD and tobacco abuse----steroids, bronchodilators, mucolytics and nicotine patch as ordered  5) chronic pain syndrome and depression and anxiety--- resume home regimen  6)HLD-continue simvastatin, monitor LFTs with concomitant use of remdesivir and simvastatin  7)Hyperglycemia--anticipate worsening hyperglycemia  with steroids, Use Novolog/Humalog Sliding scale insulin with Accu-Cheks/Fingersticks as ordered   --Prophylaxis--Protonix for GI prophylaxis while on high-dose steroids, Lovenox for DVT prophylaxis   Disposition/Need for in-Hospital Stay- patient unable to be discharged at this time due to --acute hypoxic respiratory failure due to pneumonia with concern for bacterial pneumonia superimposed on possible COVID-19  respiratory infection requiring IV antibiotic, IV steroids and IV remdesivir  Status is: Inpatient  Remains inpatient appropriate because:acute hypoxic respiratory failure due to pneumonia with concern for bacterial pneumonia superimposed on possible COVID-19 respiratory infection requiring IV antibiotic, IV steroids and IV remdesivir   Dispo: The patient is from: Home              Anticipated d/c is to: Home              Anticipated d/c date is: > 3 days              Patient currently is not medically stable to d/c. Barriers: Not Clinically Stable- acute hypoxic respiratory failure due to pneumonia with concern for bacterial pneumonia superimposed on possible COVID-19 respiratory infection requiring IV antibiotic, IV steroids and IV remdesivir   With History of - Reviewed by me  Past Medical History:  Diagnosis Date  . ARDS (adult respiratory distress syndrome) (HCC)   . Dyslipidemia       Past Surgical History:  Procedure Laterality Date  . ABDOMINAL HYSTERECTOMY    . CHOLECYSTECTOMY    . TONSILLECTOMY        Chief Complaint  Patient presents with  . Fever      HPI:    Paula Bailey  is a 59 y.o. female with past medical history relevant for ongoing tobacco abuse, COPD, depression and anxiety, osteoporosis, chronic pain syndrome and chronic opiate and benzo use, HLD who presents to the ED on 07/20/2020 with complaints of fever, cough, headache, chest pain with deep breaths, and vomiting since last night  -Please note that patient had acute respiratory failure in February 2021, she was actually intubated on 10/12/2019 and extubated on 10/16/2019--- she has done well since discharge without need for home O2  -In the ED patient is found to be hypoxic requiring up to 4 L of oxygen via nasal cannula -The ED patient is also found to be tachypneic and tachycardic, she did have low-grade temps and subjective fevers -- Patient completed 2 doses of Moderna COVID-19 vaccine on  12/25/2019--- -patient also got her flu shot -- -in ED WBC 16.5 , chest x-ray suggestive of pneumonia (Rt > Lt) --- procalcitonin is elevated at 6.0, lactic acid is elevated at 2.8 --Inflammatory markers are elevated with CRP 34.3, D-dimer of 2.24, LDH of 431, -fibrinogen greater than 800 -COVID-19 positive, influenza negative -Antibiotics administered, blood cultures requested,  CTA chest to rule out underlying PE given elevated D-dimer, acute onset of shortness of breath tachycardia and tachypnea with hypoxia  Review of systems:    In addition to the HPI above,   A full Review of  Systems was done, all other systems reviewed are negative except as noted above in HPI , .    Social History:  Reviewed by me    Social History   Tobacco Use  . Smoking status: Current Every Day Smoker    Packs/day: 0.50    Years: 40.00    Pack years: 20.00    Start date: 11/13/1981  . Smokeless tobacco: Never Used  Substance Use Topics  . Alcohol use: No  Family History :  Reviewed by me    Family History  Problem Relation Age of Onset  . CAD Father   . CAD Brother      Home Medications:   Prior to Admission medications   Medication Sig Start Date End Date Taking? Authorizing Provider  alendronate (FOSAMAX) 70 MG tablet Take 70 mg by mouth once a week. 08/26/17  Yes [provider]  ALPRAZolam Prudy Feeler) 0.5 MG tablet Take 0.25 mg by mouth at bedtime.  07/11/17  Yes [provider]  amitriptyline (ELAVIL) 10 MG tablet Take 20 mg by mouth at bedtime. 09/28/19  Yes [provider]  BREO ELLIPTA 200-25 MCG/INH AEPB INHALE ONE PUFF EVERY DAY Patient taking differently: Inhale 1 puff into the lungs daily.  07/16/20  Yes Charlott Holler, MD  fluticasone (FLONASE) 50 MCG/ACT nasal spray Place 2 sprays into both nostrils daily.    Yes [provider]  gabapentin (NEURONTIN) 300 MG capsule Take 600 mg by mouth 3 (three) times daily.  08/26/17  Yes [provider]  ibuprofen (ADVIL) 800 MG tablet Take 800 mg by mouth every 8 (eight) hours as needed for mild pain.  01/10/20  Yes [provider]  INCRUSE ELLIPTA 62.5 MCG/INH AEPB INHALE ONE PUFF EVERY DAY Patient taking differently: Inhale 1 puff into the lungs daily.  07/16/20  Yes Charlott Holler, MD  oxyCODONE-acetaminophen (PERCOCET) 7.5-325 MG tablet Take 1 tablet by mouth every 6 (six) hours as needed for moderate pain or severe pain.  09/08/17  Yes [provider]  sertraline (ZOLOFT) 100 MG tablet Take 1.5 tablets (150 mg total) by mouth daily. 08/17/20  Yes Hisada, Barbee Cough, MD  simvastatin (ZOCOR) 10 MG tablet Take 10 mg by mouth at bedtime. 10/01/19  Yes [provider]  VENTOLIN HFA 108 (90 Base) MCG/ACT inhaler Inhale 1-2 puffs into the lungs every 4 (four) hours as needed for wheezing or shortness of breath.  08/26/17  Yes [provider]  methocarbamol (ROBAXIN) 500 MG tablet Take 500 mg by mouth 3 (three) times daily as needed for muscle spasms.    [provider]     Allergies:     Allergies  Allergen Reactions  . Hydrocodone Rash     Physical Exam:   Vitals  Blood pressure 110/63, pulse (!) 109, temperature 99.4 F (37.4 C), temperature source Oral, resp. rate 19, height 5\' 5"  (1.651 m), weight 64.9 kg, SpO2 94 %.  Temp:  [99.4 F (37.4 C)] 99.4 F (37.4 C) (11/21 1039) Pulse Rate:  [100-124] 109 (11/21 1515) Resp:  [17-33] 19 (11/21 1515) BP: (98-129)/(63-81) 110/63 (11/21 1500) SpO2:  [87 %-99 %] 94 % (11/21 1515) Weight:  [64.9 kg] 64.9 kg (11/21 1027)   Physical Examination: General appearance - alert, ill- appearing,  Nose- [place] 4L/min Mental status - alert, oriented to person, place, and time,  Eyes - sclera anicteric Neck - supple, no JVD elevation , Chest -diminished breath sounds right more than left, scattered wheezes and rhonchi, tachypneic Heart - S1 and S2 normal, regular , static Abdomen - soft,  nontender, nondistended, no masses or organomegaly Neurological - screening mental status exam normal, neck supple without rigidity, cranial nerves II through XII intact, DTR's normal and symmetric Extremities - no pedal edema noted, intact peripheral pulses  Skin - warm, dry     Data Review:    CBC Recent Labs  Lab 07/20/20 1113  WBC 16.5*  HGB 15.0  HCT 45.6  PLT 210  MCV 92.3  MCH 30.4  MCHC 32.9  RDW 14.5  LYMPHSABS 1.9  MONOABS 0.4  EOSABS 0.0  BASOSABS 0.1   ------------------------------------------------------------------------------------------------------------------  Chemistries  Recent Labs  Lab 07/20/20 1113  NA 135  K 4.6  CL 102  CO2 25  GLUCOSE 158*  BUN 18  CREATININE 0.78  CALCIUM 8.6*  AST 22  ALT 23  ALKPHOS 75  BILITOT 0.3   ------------------------------------------------------------------------------------------------------------------ estimated creatinine clearance is 69 mL/min (by C-G formula based on SCr of 0.78 mg/dL). ------------------------------------------------------------------------------------------------------------------ No results for input(s): TSH, T4TOTAL, T3FREE, THYROIDAB in the last 72 hours.  Invalid input(s): FREET3   Coagulation profile No results for input(s): INR, PROTIME in the last 168 hours. ------------------------------------------------------------------------------------------------------------------- Recent Labs    07/20/20 1113  DDIMER 2.24*   -------------------------------------------------------------------------------------------------------------------  Cardiac Enzymes No results for input(s): CKMB, TROPONINI, MYOGLOBIN in the last 168 hours.  Invalid input(s): CK ------------------------------------------------------------------------------------------------------------------    Component Value Date/Time   BNP 349.0 (H) 10/15/2019 0349      ---------------------------------------------------------------------------------------------------------------  Urinalysis    Component Value Date/Time   COLORURINE YELLOW 10/11/2019 2349   APPEARANCEUR CLEAR 10/11/2019 2349   LABSPEC 1.023 10/11/2019 2349   PHURINE 6.0 10/11/2019 2349   GLUCOSEU NEGATIVE 10/11/2019 2349   HGBUR MODERATE (A) 10/11/2019 2349   BILIRUBINUR NEGATIVE 10/11/2019 2349   KETONESUR NEGATIVE 10/11/2019 2349   PROTEINUR NEGATIVE 10/11/2019 2349   NITRITE NEGATIVE 10/11/2019 2349   LEUKOCYTESUR NEGATIVE 10/11/2019 2349    ----------------------------------------------------------------------------------------------------------------   Imaging Results:    DG Chest Port 1 View  Result Date: 07/20/2020 CLINICAL DATA:  Shortness of breath and fever. EXAM: PORTABLE CHEST 1 VIEW COMPARISON:  Jan 10, 2020 FINDINGS: New infiltrate is seen in the right mid lower lung. Suggested minimal opacity in left base. The heart, hila, mediastinum, lungs, and pleura are otherwise unchanged. IMPRESSION: Right mid lower lung infiltrate worrisome for pneumonia given history. Possible minimal opacity in left base is well. Recommend short-term follow-up imaging to ensure resolution. Electronically Signed   By: Gerome Samavid  Williams III M.D   On: 07/20/2020 11:23    Radiological Exams on Admission: DG Chest Port 1 View  Result Date: 07/20/2020 CLINICAL DATA:  Shortness of breath and fever. EXAM: PORTABLE CHEST 1 VIEW COMPARISON:  Jan 10, 2020 FINDINGS: New infiltrate is seen in the right mid lower lung. Suggested minimal opacity in left base. The heart, hila, mediastinum, lungs, and pleura are otherwise unchanged. IMPRESSION: Right mid lower lung infiltrate worrisome for pneumonia given history. Possible minimal opacity in left base is well. Recommend short-term follow-up imaging to ensure resolution. Electronically Signed   By: Gerome Samavid  Williams III M.D   On: 07/20/2020 11:23    DVT  Prophylaxis -SCD /lovenox AM Labs Ordered, also please review Full Orders  Family Communication: Admission, patients condition and plan of care including tests being ordered have been discussed with the patient who indicate understanding and agree with the plan   Code Status - Full Code  Likely DC to  Home   Condition   stable  Shon Haleourage Conley Delisle M.D on 07/20/2020 at 3:39 PM Go to [email or web address] -  for contact info  [facility] - Office  [phone]

## 2020-07-20 NOTE — ED Triage Notes (Signed)
Pt reports fever, cough, headache, chest pain with deep breaths, and vomiting since last night.

## 2020-07-20 NOTE — ED Provider Notes (Signed)
[facility] EMERGENCY DEPARTMENT Provider Note   CSN: [id number] Arrival date & time: 07/20/20  0957     History Chief Complaint  Patient presents with  . Fever    Paula Bailey is a 59 y.o. female.  The history is provided by the patient. No language interpreter was used.  Shortness of Breath Severity:  Moderate Onset quality:  Sudden Duration:  1 day Timing:  Constant Progression:  Worsening Chronicity:  New Relieved by:  Nothing Worsened by:  Nothing Ineffective treatments:  None tried Associated symptoms: cough   Risk factors: tobacco use    Pt complains of a cough and shortness of breath.     Past Medical History:  Diagnosis Date  . ARDS (adult respiratory distress syndrome) (HCC)   . Dyslipidemia     Patient Active Problem List   Diagnosis Date Noted  . Lumbar radiculopathy 12/10/2019  . Sepsis due to undetermined organism (HCC) 10/12/2019  . Chronic pain syndrome 10/12/2019  . Lobar pneumonia (HCC) 10/12/2019  . CAP (community acquired pneumonia) 10/11/2019  . Leukocytosis 10/11/2019  . Hyponatremia 10/11/2019  . Elevated brain natriuretic peptide (BNP) level 10/11/2019  . Depression 10/11/2019  . Tobacco abuse 10/11/2019  . Acute respiratory failure with hypoxia (HCC) 10/11/2019  . SIRS (systemic inflammatory response syndrome) (HCC) 10/11/2019  . Sepsis (HCC) 10/11/2019  . MDD (major depressive disorder), recurrent, in full remission (HCC) 09/20/2019  . Anxiety 09/20/2019  . Current moderate episode of major depressive disorder without prior episode (HCC) 09/27/2017    Past Surgical History:  Procedure Laterality Date  . ABDOMINAL HYSTERECTOMY    . CHOLECYSTECTOMY    . TONSILLECTOMY       OB History   No obstetric history on file.     Family History  Problem Relation Age of Onset  . CAD Father   . CAD Brother     Social History   Tobacco Use  . Smoking status: Current Every Day Smoker    Packs/day: 0.50    Years: 40.00     Pack years: 20.00    Start date: 11/13/1981  . Smokeless tobacco: Never Used  Vaping Use  . Vaping Use: Never used  Substance Use Topics  . Alcohol use: No  . Drug use: Not Currently    Home Medications Prior to Admission medications   Medication Sig Start Date End Date Taking? Authorizing Provider  alendronate (FOSAMAX) 70 MG tablet Take 70 mg by mouth once a week. 08/26/17   [provider]  ALPRAZolam Prudy Feeler) 0.5 MG tablet Take 0.25 mg by mouth at bedtime.  07/11/17   [provider]  amitriptyline (ELAVIL) 10 MG tablet Take 20 mg by mouth at bedtime. 09/28/19   [provider]  Earlie Server 196-22 MCG/INH AEPB INHALE ONE PUFF EVERY DAY 07/16/20   Charlott Holler, MD  fluticasone [facility]) 50 MCG/ACT nasal spray Allergy Relief (fluticasone) 50 mcg/actuation nasal spray,suspension  Spray 1 spray every day by intranasal route.    [provider]  gabapentin (NEURONTIN) 300 MG capsule Take 600 mg by mouth 3 (three) times daily.  08/26/17   [provider]  ibuprofen (ADVIL) 800 MG tablet Take 800 mg by mouth every 8 (eight) hours as needed. 01/10/20   [provider]  INCRUSE ELLIPTA 62.5 MCG/INH AEPB INHALE ONE PUFF EVERY DAY 07/16/20   Charlott Holler, MD  oxyCODONE-acetaminophen (PERCOCET) 7.5-325 MG tablet Take 1 tablet by mouth every 6 (six) hours as needed for moderate pain or  severe pain.  09/08/17   [provider]  sertraline (ZOLOFT) 100 MG tablet Take 1.5 tablets (150 mg total) by mouth daily. 08/17/20   [name], MD  simvastatin (ZOCOR) 10 MG tablet Take 10 mg by mouth at bedtime. 10/01/19   [provider]  VENTOLIN HFA 108 (90 Base) MCG/ACT inhaler Inhale 1-2 puffs into the lungs every 4 (four) hours as needed for wheezing or shortness of breath.  08/26/17   [provider]    Allergies    Patient has no known allergies.  Review of Systems   Review of Systems  Respiratory: Positive for cough  and shortness of breath.   All other systems reviewed and are negative.   Physical Exam Updated Vital Signs BP 98/68   Pulse (!) 106   Temp 99.4 F (37.4 C) (Oral)   Resp (!) 24   Ht 5\' 5"  (1.651 m)   Wt 64.9 kg   SpO2 95%   BMI 23.80 kg/m   Physical Exam Vitals and nursing note reviewed.  Constitutional:      Appearance: She is well-developed.  HENT:     Head: Normocephalic.     Right Ear: Tympanic membrane normal.     Left Ear: Tympanic membrane normal.     Nose: Nose normal.     Mouth/Throat:     Mouth: Mucous membranes are moist.  Eyes:     Pupils: Pupils are equal, round, and reactive to light.  Cardiovascular:     Rate and Rhythm: Normal rate and regular rhythm.     Pulses: Normal pulses.  Pulmonary:     Effort: Pulmonary effort is normal.  Abdominal:     General: Abdomen is flat. There is no distension.  Musculoskeletal:        General: Normal range of motion.     Cervical back: Normal range of motion.  Skin:    General: Skin is warm.  Neurological:     General: No focal deficit present.     Mental Status: She is alert and oriented to person, place, and time.  Psychiatric:        Mood and Affect: Mood normal.     ED Results / Procedures / Treatments   Labs (all labs ordered are listed, but only abnormal results are displayed) Labs Reviewed  RESP PANEL BY RT-PCR (FLU A&B, COVID) ARPGX2 - Abnormal; Notable for the following components:      Result Value   SARS Coronavirus 2 by RT PCR POSITIVE (*)    All other components within normal limits  CBC WITH DIFFERENTIAL/PLATELET - Abnormal; Notable for the following components:   WBC 16.5 (*)    Neutro Abs 14.0 (*)    Abs Immature Granulocytes 0.12 (*)    All other components within normal limits  COMPREHENSIVE METABOLIC PANEL - Abnormal; Notable for the following components:   Glucose, Bld 158 (*)    Calcium 8.6 (*)    Albumin 3.2 (*)    All other components within normal limits     EKG None  Radiology DG Chest Port 1 View  Result Date: 07/20/2020 CLINICAL DATA:  Shortness of breath and fever. EXAM: PORTABLE CHEST 1 VIEW COMPARISON:  Jan 10, 2020 FINDINGS: New infiltrate is seen in the right mid lower lung. Suggested minimal opacity in left base. The heart, hila, mediastinum, lungs, and pleura are otherwise unchanged. IMPRESSION: Right mid lower lung infiltrate worrisome for pneumonia given history. Possible minimal opacity in left base is well. Recommend  short-term follow-up imaging to ensure resolution. Electronically Signed   By: Gerome Sam III M.D   On: 07/20/2020 11:23    Procedures Procedures (including critical care time)  Medications Ordered in ED Medications  acetaminophen (TYLENOL) tablet 650 mg (has no administration in time range)    ED Course  I have reviewed the triage vital signs and the nursing notes.  Pertinent labs & imaging results that were available during my care of the patient were reviewed by me and considered in my medical decision making (see chart for details).    MDM Rules/Calculators/A&P                          MDM:   Pt's 02 on room air 75-78%.  Pt placed on 2 liters and improved to 94 %.  Pt has had covid and flu vaccine Chest xray shows pneumonia, covid is positive. I spoke to Hospitalist who will see and admit pt Final Clinical Impression(s) / ED Diagnoses Final diagnoses:  Pneumonia due to COVID-19 virus    Rx / DC Orders ED Discharge Orders    None       Elson Areas, [place] 07/20/20 1503    Mancel Bale, MD 07/21/20 1413

## 2020-07-20 NOTE — Plan of Care (Signed)
  Problem: Education: Goal: Knowledge of General Education information will improve Description: Including pain rating scale, medication(s)/side effects and non-pharmacologic comfort measures Outcome: Progressing   Problem: Pain Managment: Goal: General experience of comfort will improve Outcome: Progressing   Problem: Safety: Goal: Ability to remain free from injury will improve Outcome: Progressing   

## 2020-07-20 NOTE — Progress Notes (Signed)
   07/20/20 1630  Assess: MEWS Score  Temp (!) 101.9 F (38.8 C)  BP 123/73  Pulse Rate (!) 108  Resp (!) 32  Level of Consciousness Alert  SpO2 95 %  O2 Device Nasal Cannula  O2 Flow Rate (L/min) 2 L/min  Assess: MEWS Score  MEWS Temp 2  MEWS Systolic 0  MEWS Pulse 1  MEWS RR 2  MEWS LOC 0  MEWS Score 5  MEWS Score Color Red  Assess: if the MEWS score is Yellow or Red  Were vital signs taken at a resting state? Yes  Focused Assessment No change from prior assessment  MEWS guidelines implemented *See Row Information* Yes  Treat  MEWS Interventions Administered scheduled meds/treatments;Administered prn meds/treatments  Pain Scale 0-10  Pain Score 0  Take Vital Signs  Increase Vital Sign Frequency  Red: Q 1hr X 4 then Q 4hr X 4, if remains red, continue Q 4hrs  Escalate  MEWS: Escalate Red: discuss with charge nurse/RN and provider, consider discussing with RRT  Notify: Charge Nurse/RN  Name of Charge Nurse/RN Notified Janan Ridge, RN and Rudie Meyer, [facility]  Date Charge Nurse/RN Notified 07/20/20  Time Charge Nurse/RN Notified 1630  Notify: Provider  Provider Name/Title Dr. Mariea Clonts  Date Provider Notified 07/20/20  Time Provider Notified 1630  Notification Type Page  Notification Reason Other (Comment) (red mews)  Response Other (Comment) (will give tylenol for fever and turn oxygen up to 4L)  Date of Provider Response 07/20/20  Time of Provider Response 1630  Document  Patient Outcome Stabilized after interventions (temp came down to 99.8, will continue hourly vitals)

## 2020-07-20 NOTE — ED Notes (Signed)
Change pt O2 to 4l per PA

## 2020-07-21 DIAGNOSIS — Z720 Tobacco use: Secondary | ICD-10-CM | POA: Diagnosis not present

## 2020-07-21 DIAGNOSIS — A419 Sepsis, unspecified organism: Secondary | ICD-10-CM | POA: Diagnosis not present

## 2020-07-21 DIAGNOSIS — G894 Chronic pain syndrome: Secondary | ICD-10-CM | POA: Diagnosis not present

## 2020-07-21 DIAGNOSIS — U071 COVID-19: Secondary | ICD-10-CM | POA: Diagnosis not present

## 2020-07-21 LAB — C-REACTIVE PROTEIN: CRP: 27.8 mg/dL — ABNORMAL HIGH (ref <1.0)

## 2020-07-21 LAB — CBC WITH DIFFERENTIAL/PLATELET
Abs Immature Granulocytes: 0.08 10*3/uL — ABNORMAL HIGH (ref 0.00–0.07)
Basophils Absolute: 0 10*3/uL (ref 0.0–0.1)
Basophils Relative: 0 %
Eosinophils Absolute: 0 10*3/uL (ref 0.0–0.5)
Eosinophils Relative: 0 %
HCT: 47 % — ABNORMAL HIGH (ref 36.0–46.0)
Hemoglobin: 14.4 g/dL (ref 12.0–15.0)
Immature Granulocytes: 1 %
Lymphocytes Relative: 14 %
Lymphs Abs: 1.6 10*3/uL (ref 0.7–4.0)
MCH: 29.4 pg (ref 26.0–34.0)
MCHC: 30.6 g/dL (ref 30.0–36.0)
MCV: 96.1 fL (ref 80.0–100.0)
Monocytes Absolute: 0.4 10*3/uL (ref 0.1–1.0)
Monocytes Relative: 3 %
Neutro Abs: 9.4 10*3/uL — ABNORMAL HIGH (ref 1.7–7.7)
Neutrophils Relative %: 82 %
Platelets: 199 10*3/uL (ref 150–400)
RBC: 4.89 MIL/uL (ref 3.87–5.11)
RDW: 14.4 % (ref 11.5–15.5)
WBC: 11.5 10*3/uL — ABNORMAL HIGH (ref 4.0–10.5)
nRBC: 0 % (ref 0.0–0.2)

## 2020-07-21 LAB — PHOSPHORUS: Phosphorus: 4.1 mg/dL (ref 2.5–4.6)

## 2020-07-21 LAB — COMPREHENSIVE METABOLIC PANEL
ALT: 21 U/L (ref 0–44)
AST: 17 U/L (ref 15–41)
Albumin: 3 g/dL — ABNORMAL LOW (ref 3.5–5.0)
Alkaline Phosphatase: 76 U/L (ref 38–126)
Anion gap: 12 (ref 5–15)
BUN: 18 mg/dL (ref 6–20)
CO2: 23 mmol/L (ref 22–32)
Calcium: 8.1 mg/dL — ABNORMAL LOW (ref 8.9–10.3)
Chloride: 106 mmol/L (ref 98–111)
Creatinine, Ser: 0.68 mg/dL (ref 0.44–1.00)
GFR, Estimated: >60 mL/min (ref >60)
Glucose, Bld: 116 mg/dL — ABNORMAL HIGH (ref 70–99)
Potassium: 4.7 mmol/L (ref 3.5–5.1)
Sodium: 141 mmol/L (ref 135–145)
Total Bilirubin: 0.4 mg/dL (ref 0.3–1.2)
Total Protein: 6.9 g/dL (ref 6.5–8.1)

## 2020-07-21 LAB — GLUCOSE, CAPILLARY
Glucose-Capillary: 150 mg/dL — ABNORMAL HIGH (ref 70–99)
Glucose-Capillary: 152 mg/dL — ABNORMAL HIGH (ref 70–99)
Glucose-Capillary: 191 mg/dL — ABNORMAL HIGH (ref 70–99)
Glucose-Capillary: 139 mg/dL — ABNORMAL HIGH (ref 70–99)

## 2020-07-21 LAB — FERRITIN: Ferritin: 130 ng/mL (ref 11–307)

## 2020-07-21 LAB — MAGNESIUM: Magnesium: 2.3 mg/dL (ref 1.7–2.4)

## 2020-07-21 LAB — D-DIMER, QUANTITATIVE: D-Dimer, Quant: 1.49 ug/mL-FEU — ABNORMAL HIGH (ref 0.00–0.50)

## 2020-07-21 NOTE — Progress Notes (Signed)
PROGRESS NOTE   Paula Bailey  BSJ:[id number] DOB: September 10, 1960 DOA: 07/20/2020 PCP: Ignatius Specking, MD   Chief Complaint  Patient presents with  . Fever    Brief Admission History:  59 y.o. female with past medical history relevant for ongoing tobacco abuse, COPD, depression and anxiety, osteoporosis, chronic pain syndrome and chronic opiate and benzo use, HLD who presents to the ED on 07/20/2020 with complaints of fever, cough, headache, chest pain with deep breaths, and vomiting since last night  -Please note that patient had acute respiratory failure in February 2021, she was actually intubated on 10/12/2019 and extubated on 10/16/2019--- she has done well since discharge without need for home O2  -In the ED patient is found to be hypoxic requiring up to 4 L of oxygen via nasal cannula -The ED patient is also found to be tachypneic and tachycardic, she did have low-grade temps and subjective fevers -- Patient completed 2 doses of Moderna COVID-19 vaccine on 12/25/2019--- -patient also got her flu vaccine   -in ED WBC 16.5 , chest x-ray suggestive of pneumonia (Rt > Lt) --- procalcitonin is elevated at 6.0, lactic acid is elevated at 2.8 --Inflammatory markers are elevated with CRP 34.3, D-dimer of 2.24, LDH of 431, -fibrinogen greater than 800 -COVID-19 positive, influenza negative -Antibiotics administered, blood cultures requested,  CTA chest to rule out underlying PE given elevated D-dimer, acute onset of shortness of breath tachycardia and tachypnea with hypoxia    Assessment & Plan:   Principal Problem:   Severe Sepsis due to pneumonia Active Problems:   Anxiety   Depression   Tobacco abuse   Acute respiratory failure with hypoxia (HCC)   Chronic pain syndrome   Acute respiratory disease due to COVID-19 virus   Community acquired pneumonia -Rt > Lt   Pneumonia due to COVID-19 virus  1. Acute respiratory failure with hypoxia - continue supplemental oxygen and supportive  measures. CTA chest negative for pulmonary embolus.  Given history patient high risk for decompensation and intubation.  Careful with opioids.  2. Severe sepsis secondary to community acquired pneumonia - presumed bacterial given markedly elevated procalcitonin.  Continue IV antibiotics.   3. Covid infection - Pt being treated with Iv steroids and supportive management.  Continue IV remdesivir. Continue oxygen support and follow inflammatory markers. Continue zinc and vitamin C. Pt has been fully vaccinated for Covid.  4. COPD and tobacco - continue steroids, bronchodilators, mucolytics and nicotine patch.  5. Chronic pain and opioid dependence - resume home meds.  6. Hyperlipidemia - she is on simvastatin.  7. Steroid induced hyperglycemia - continue SSI coverage and CBG monitoring.    DVT prophylaxis: enoxaparin Code Status: full  Family Communication:  Disposition: Home   Status is: Inpatient  Remains inpatient appropriate because:IV treatments appropriate due to intensity of illness or inability to take PO and Inpatient level of care appropriate due to severity of illness  Dispo:  Patient From: Home  Planned Disposition: Home  Expected discharge date: 07/24/20  Medically stable for discharge: No  Consultants:   Pulmonary   Procedures:     Antimicrobials:  Ceftriaxone 11/21>> Azithromycin 11/21>>   Subjective: Pt reports that she needs her percocet.  Her cough is productive of whitish/green sputum with wheezing  Objective: Vitals:   07/20/20 2200 07/21/20 0518 07/21/20 0839 07/21/20 0900  BP: 135/74 124/64    Pulse: 99 78    Resp: (!) 24 (!) 24    Temp: 98.8 F (37.1 C) 98.3 F (  36.8 C)  98.4 F (36.9 C)  TempSrc: Oral Oral  Oral  SpO2: 97% 97% 97%   Weight:      Height:        Intake/Output Summary (Last 24 hours) at 07/21/2020 1556 Last data filed at 07/21/2020 0300 Gross per 24 hour  Intake 1121.88 ml  Output --  Net 1121.88 ml   Filed Weights    07/20/20 1027  Weight: 64.9 kg    Examination:  General exam: Appears calm and comfortable, NAd.  Respiratory system: bilateral expiratory wheezing heard. Respiratory effort normal. Cardiovascular system: normal S1 & S2 heard. No JVD, murmurs, rubs, gallops or clicks. No pedal edema. Gastrointestinal system: Abdomen is nondistended, soft and nontender. No organomegaly or masses felt. Normal bowel sounds heard. Central nervous system: Alert and oriented. No focal neurological deficits. Extremities: Symmetric 5 x 5 power. Skin: No rashes, lesions or ulcers Psychiatry: Judgement and insight appear normal. Mood & affect appropriate.   Data Reviewed: I have personally reviewed following labs and imaging studies  CBC: Recent Labs  Lab 07/20/20 1113 07/21/20 0513  WBC 16.5* 11.5*  NEUTROABS 14.0* 9.4*  HGB 15.0 14.4  HCT 45.6 47.0*  MCV 92.3 96.1  PLT 210 199    Basic Metabolic Panel: Recent Labs  Lab 07/20/20 1113 07/21/20 0513  NA 135 141  K 4.6 4.7  CL 102 106  CO2 25 23  GLUCOSE 158* 116*  BUN 18 18  CREATININE 0.78 0.68  CALCIUM 8.6* 8.1*  MG  --  2.3  PHOS  --  4.1    GFR: Estimated Creatinine Clearance: 69 mL/min (by C-G formula based on SCr of 0.68 mg/dL).  Liver Function Tests: Recent Labs  Lab 07/20/20 1113 07/21/20 0513  AST 22 17  ALT 23 21  ALKPHOS 75 76  BILITOT 0.3 0.4  PROT 7.3 6.9  ALBUMIN 3.2* 3.0*    CBG: Recent Labs  Lab 07/20/20 2126 07/21/20 0754 07/21/20 1114  GLUCAP 164* 150* 152*    Recent Results (from the past 240 hour(s))  Resp Panel by RT-PCR (Flu A&B, Covid) Nasopharyngeal Swab     Status: Abnormal   Collection Time: 07/20/20 10:43 AM   Specimen: Nasopharyngeal Swab; Nasopharyngeal(NP) swabs in vial transport medium  Result Value Ref Range Status   SARS Coronavirus 2 by RT PCR POSITIVE (A) NEGATIVE Final    Comment: RESULT CALLED TO, READ BACK BY AND VERIFIED WITH: [email or web address]  BY MATTHEWS,B  11.21.21 (NOTE) SARS-CoV-2 target nucleic acids are DETECTED.  The SARS-CoV-2 RNA is generally detectable in upper respiratory specimens during the acute phase of infection. Positive results are indicative of the presence of the identified virus, but do not rule out bacterial infection or co-infection with other pathogens not detected by the test. Clinical correlation with patient history and other diagnostic information is necessary to determine patient infection status. The expected result is Negative.  Fact Sheet for Patients: BloggerCourse.comhttps://www.fda.gov/media/152166/download  Fact Sheet for Healthcare Providers: SeriousBroker.ithttps://www.fda.gov/media/152162/download  This test is not yet approved or cleared by the [place] FDA and  has been authorized for detection and/or diagnosis of SARS-CoV-2 by FDA under an Emergency Use Authorization (EUA).  This EUA will remain in effect (meaning this test ca n be used) for the duration of  the COVID-19 declaration under Section 564(b)(1) of the Act, 21 U.S.C. section 360bbb-3(b)(1), unless the authorization is terminated or revoked sooner.     Influenza A by PCR NEGATIVE NEGATIVE Final   Influenza B by PCR NEGATIVE NEGATIVE  Final    Comment: (NOTE) The Xpert Xpress SARS-CoV-2/FLU/RSV plus assay is intended as an aid in the diagnosis of influenza from Nasopharyngeal swab specimens and should not be used as a sole basis for treatment. Nasal washings and aspirates are unacceptable for Xpert Xpress SARS-CoV-2/FLU/RSV testing.  Fact Sheet for Patients: BloggerCourse.com  Fact Sheet for Healthcare Providers: SeriousBroker.it  This test is not yet approved or cleared by the [place] FDA and has been authorized for detection and/or diagnosis of SARS-CoV-2 by FDA under an Emergency Use Authorization (EUA). This EUA will remain in effect (meaning this test can be used) for the duration of  the COVID-19 declaration under Section 564(b)(1) of the Act, 21 U.S.C. section 360bbb-3(b)(1), unless the authorization is terminated or revoked.  Performed at [facility], [address], [place], [place] [address]   Blood Culture (routine x 2)     Status: None (Preliminary result)   Collection Time: 07/20/20 11:13 AM   Specimen: BLOOD  Result Value Ref Range Status   Specimen Description   Final    BLOOD BLOOD LEFT ARM Performed at [facility], [address], [place], [place] [address]    Special Requests   Final    BOTTLES DRAWN AEROBIC AND ANAEROBIC Blood Culture adequate volume Performed at [facility], [address] [place], [place] [address]    Culture   Final    NO GROWTH 1 DAY Performed at [facility], [address], [place], [place] [address]    Report Status PENDING  Incomplete  Blood Culture (routine x 2)     Status: None (Preliminary result)   Collection Time: 07/20/20  1:15 PM   Specimen: BLOOD  Result Value Ref Range Status   Specimen Description   Final    BLOOD LEFT ANTECUBITAL Performed at [facility], [address], [place], [place] [address]    Special Requests   Final    BOTTLES DRAWN AEROBIC AND ANAEROBIC Blood Culture adequate volume Performed at [facility], [address], [place], [place] [address]    Culture   Final    NO GROWTH 1 DAY Performed at [facility], [address], [place], [place] [address]    Report Status PENDING  Incomplete     Radiology Studies: CT ANGIO CHEST PE W OR WO CONTRAST  Result Date: 07/20/2020 CLINICAL DATA:  Evaluate for pulmonary embolus. Fever and cough. Chest pain. By report, the patient is positive for COVID-19. EXAM: CT ANGIOGRAPHY CHEST WITH CONTRAST TECHNIQUE: Multidetector CT imaging of the chest was performed using the standard protocol during bolus administration of intravenous contrast. Multiplanar CT image reconstructions and MIPs were obtained to evaluate the vascular anatomy.  CONTRAST:  OMNIPAQUE IOHEXOL 350 MG/ML SOLN COMPARISON:  Chest x-ray from earlier today. CT pulmonary angiogram October 11, 2019. FINDINGS: Cardiovascular: Mild calcified atherosclerosis in the thoracic aorta. The thoracic aorta is nonaneurysmal and there is no evidence of dissection. Cardiomegaly identified. The main pulmonary artery measures 3.1 cm. No pulmonary emboli are identified. No pulmonary emboli. Mediastinum/Nodes: Small bilateral pleural effusions are identified. No pericardial effusion. The esophagus and thyroid are normal. Mediastinal adenopathy is similar in the interval. Right greater than left hilar adenopathy is similar as well. The chest wall is normal. Lungs/Pleura: The central airways are normal. No pneumothorax. Patchy bilateral pulmonary infiltrates are identified. There are similar in character to the infiltrates seen in February of 2021. However, within the regions of ground-glass, there are some focal regions of  denser consolidation. All lobes are and affected. No nodules or masses. Upper Abdomen: No acute abnormality. Musculoskeletal: No chest wall abnormality. No acute or significant osseous findings. Review of the MIP images confirms the above findings. IMPRESSION: 1. No pulmonary emboli. 2. Diffuse bilateral pulmonary infiltrates involving all lobes. In February 2021, the infiltrates were almost completely ground-glass. Today, the infiltrates are mostly ground-glass but do contain areas of denser or more focal consolidation. The findings are not specific. Given the history of COVID-19, the findings could represent COVID-19 pneumonia. Other atypical infections could have a similar appearance. There are other inflammatory conditions which could have a similar appearance. Pulmonary edema should also be considered. 3. Adenopathy in the mediastinum is similar since February 2021. Given the lung findings, I suspect the nodes are reactive. Recommend short-term follow-up imaging after  treatment of the patient's pulmonary process to ensure the lymph nodes return to normal. 4. Mild calcified atherosclerosis in the thoracic aorta. 5. Cardiomegaly. 6. The pulmonary artery measures 3.1 cm raising the possibility of pulmonary arterial hypertension. 7. Small bilateral pleural effusions. 8. No other abnormalities. Aortic Atherosclerosis (ICD10-I70.0). Electronically Signed   By: Gerome Sam III M.D   On: 07/20/2020 16:44   DG Chest Port 1 View  Result Date: 07/20/2020 CLINICAL DATA:  Shortness of breath and fever. EXAM: PORTABLE CHEST 1 VIEW COMPARISON:  Jan 10, 2020 FINDINGS: New infiltrate is seen in the right mid lower lung. Suggested minimal opacity in left base. The heart, hila, mediastinum, lungs, and pleura are otherwise unchanged. IMPRESSION: Right mid lower lung infiltrate worrisome for pneumonia given history. Possible minimal opacity in left base is well. Recommend short-term follow-up imaging to ensure resolution. Electronically Signed   By: Gerome Sam III M.D   On: 07/20/2020 11:23     Scheduled Meds: . ALPRAZolam  0.25 mg Oral QHS  . amitriptyline  20 mg Oral QHS  . vitamin C  500 mg Oral Daily  . enoxaparin (LOVENOX) injection  40 mg Subcutaneous Q24H  . fluticasone furoate-vilanterol  1 puff Inhalation Daily  . folic acid  1 mg Oral Daily  . gabapentin  600 mg Oral TID  . insulin aspart  0-5 Units Subcutaneous QHS  . insulin aspart  0-9 Units Subcutaneous TID WC  . methylPREDNISolone (SOLU-MEDROL) injection  0.5 mg/kg Intravenous Q12H   Followed by  . [START ON 07/24/2020] predniSONE  50 mg Oral Daily  . multivitamin with minerals  1 tablet Oral Daily  . nicotine  14 mg Transdermal Daily  . pantoprazole  40 mg Oral Daily  . sertraline  150 mg Oral Daily  . simvastatin  10 mg Oral QHS  . sodium chloride flush  3 mL Intravenous Q12H  . thiamine  100 mg Oral Daily  . umeclidinium bromide  1 puff Inhalation Daily  . zinc sulfate  220 mg Oral Daily    Continuous Infusions: . sodium chloride    . azithromycin 500 mg (07/20/20 2243)  . cefTRIAXone (ROCEPHIN)  IV 1 g (07/20/20 2204)  . remdesivir 100 mg in NS 100 mL 100 mg (07/21/20 0947)     LOS: 1 day   Time spent: 36 mins   Deontray Hunnicutt Laural Benes, MD How to contact the [facility] Attending or Consulting provider 7A - 7P or covering provider during after hours 7P -7A, for this patient?  1. Check the care team in [facility] and look for a) attending/consulting [facility] provider listed and b) the [facility] team listed 2. Log into [email or web address] and use  [place] universal password to access. If you do not have the password, please contact the hospital operator. 3. Locate the [facility] provider you are looking for under [facility] and page to a number that you can be directly reached. 4. If you still have difficulty reaching the provider, please page the [facility] (Director on Call) for the Hospitalists listed on amion for assistance.  07/21/2020, 3:56 PM

## 2020-07-22 ENCOUNTER — Inpatient Hospital Stay (HOSPITAL_COMMUNITY): Payer: Medicare Other

## 2020-07-22 DIAGNOSIS — G894 Chronic pain syndrome: Secondary | ICD-10-CM | POA: Diagnosis not present

## 2020-07-22 DIAGNOSIS — Z720 Tobacco use: Secondary | ICD-10-CM | POA: Diagnosis not present

## 2020-07-22 DIAGNOSIS — U071 COVID-19: Secondary | ICD-10-CM | POA: Diagnosis not present

## 2020-07-22 DIAGNOSIS — A419 Sepsis, unspecified organism: Secondary | ICD-10-CM | POA: Diagnosis not present

## 2020-07-22 LAB — CBC WITH DIFFERENTIAL/PLATELET
Abs Immature Granulocytes: 0.1 10*3/uL — ABNORMAL HIGH (ref 0.00–0.07)
Basophils Absolute: 0 10*3/uL (ref 0.0–0.1)
Basophils Relative: 0 %
Eosinophils Absolute: 0 10*3/uL (ref 0.0–0.5)
Eosinophils Relative: 0 %
HCT: 41.5 % (ref 36.0–46.0)
Hemoglobin: 13.3 g/dL (ref 12.0–15.0)
Immature Granulocytes: 1 %
Lymphocytes Relative: 10 %
Lymphs Abs: 1.4 10*3/uL (ref 0.7–4.0)
MCH: 29.7 pg (ref 26.0–34.0)
MCHC: 32 g/dL (ref 30.0–36.0)
MCV: 92.6 fL (ref 80.0–100.0)
Monocytes Absolute: 0.6 10*3/uL (ref 0.1–1.0)
Monocytes Relative: 4 %
Neutro Abs: 12 10*3/uL — ABNORMAL HIGH (ref 1.7–7.7)
Neutrophils Relative %: 85 %
Platelets: 184 10*3/uL (ref 150–400)
RBC: 4.48 MIL/uL (ref 3.87–5.11)
RDW: 14.5 % (ref 11.5–15.5)
WBC: 14.1 10*3/uL — ABNORMAL HIGH (ref 4.0–10.5)
nRBC: 0 % (ref 0.0–0.2)

## 2020-07-22 LAB — COMPREHENSIVE METABOLIC PANEL
ALT: 18 U/L (ref 0–44)
AST: 11 U/L — ABNORMAL LOW (ref 15–41)
Albumin: 2.8 g/dL — ABNORMAL LOW (ref 3.5–5.0)
Alkaline Phosphatase: 61 U/L (ref 38–126)
Anion gap: 9 (ref 5–15)
BUN: 22 mg/dL — ABNORMAL HIGH (ref 6–20)
CO2: 23 mmol/L (ref 22–32)
Calcium: 8.3 mg/dL — ABNORMAL LOW (ref 8.9–10.3)
Chloride: 107 mmol/L (ref 98–111)
Creatinine, Ser: 0.59 mg/dL (ref 0.44–1.00)
GFR, Estimated: >60 mL/min (ref >60)
Glucose, Bld: 134 mg/dL — ABNORMAL HIGH (ref 70–99)
Potassium: 4.3 mmol/L (ref 3.5–5.1)
Sodium: 139 mmol/L (ref 135–145)
Total Bilirubin: 0.1 mg/dL — ABNORMAL LOW (ref 0.3–1.2)
Total Protein: 6.4 g/dL — ABNORMAL LOW (ref 6.5–8.1)

## 2020-07-22 LAB — GLUCOSE, CAPILLARY: Glucose-Capillary: 161 mg/dL — ABNORMAL HIGH (ref 70–99)

## 2020-07-22 LAB — C-REACTIVE PROTEIN: CRP: 12.3 mg/dL — ABNORMAL HIGH (ref <1.0)

## 2020-07-22 LAB — PROCALCITONIN: Procalcitonin: 1.54 ng/mL

## 2020-07-22 LAB — PHOSPHORUS: Phosphorus: 3.2 mg/dL (ref 2.5–4.6)

## 2020-07-22 LAB — D-DIMER, QUANTITATIVE: D-Dimer, Quant: 0.65 ug/mL-FEU — ABNORMAL HIGH (ref 0.00–0.50)

## 2020-07-22 LAB — FERRITIN: Ferritin: 97 ng/mL (ref 11–307)

## 2020-07-22 LAB — MAGNESIUM: Magnesium: 2.3 mg/dL (ref 1.7–2.4)

## 2020-07-22 NOTE — Progress Notes (Signed)
PROGRESS NOTE   Paula Bailey  VVO:[id number] DOB: [birth year] DOA: 07/20/2020 PCP: Ignatius Specking, MD   Chief Complaint  Patient presents with  . Fever    Brief Admission History:  59 y.o. female with past medical history relevant for ongoing tobacco abuse, COPD, depression and anxiety, osteoporosis, chronic pain syndrome and chronic opiate and benzo use, HLD who presents to the ED on 07/20/2020 with complaints of fever, cough, headache, chest pain with deep breaths, and vomiting since last night  -Please note that patient had acute respiratory failure in February 2021, she was actually intubated on 10/12/2019 and extubated on 10/16/2019--- she has done well since discharge without need for home O2  -In the ED patient is found to be hypoxic requiring up to 4 L of oxygen via nasal cannula -The ED patient is also found to be tachypneic and tachycardic, she did have low-grade temps and subjective fevers -- Patient completed 2 doses of Moderna COVID-19 vaccine on 12/25/2019--- -patient also got her flu vaccine   -in ED WBC 16.5 , chest x-ray suggestive of pneumonia (Rt > Lt) --- procalcitonin is elevated at 6.0, lactic acid is elevated at 2.8 --Inflammatory markers are elevated with CRP 34.3, D-dimer of 2.24, LDH of 431, -fibrinogen greater than 800 -COVID-19 positive, influenza negative -Antibiotics administered, blood cultures requested,  CTA chest to rule out underlying PE given elevated D-dimer, acute onset of shortness of breath tachycardia and tachypnea with hypoxia    Assessment & Plan:   Principal Problem:   Severe Sepsis due to pneumonia Active Problems:   Anxiety   Depression   Tobacco abuse   Acute respiratory failure with hypoxia (HCC)   Chronic pain syndrome   Acute respiratory disease due to COVID-19 virus   Community acquired pneumonia -Rt > Lt   Pneumonia due to COVID-19 virus  1. Acute respiratory failure with hypoxia - continue supplemental oxygen and supportive  measures. CTA chest negative for pulmonary embolus.  Given history patient high risk for decompensation and intubation.  Careful with opioids.  2. Severe sepsis secondary to community acquired pneumonia - presumed bacterial given markedly elevated procalcitonin.  Continue IV antibiotics.    Procalcitonin trending down with treatment.  3. Covid infection - Pt being treated with Iv steroids and supportive management.  Continue IV remdesivir. Continue oxygen support and follow inflammatory markers as they are trending down. Continue zinc and vitamin C. Pt has been fully vaccinated for Covid.  Repeated CXR 11/23 with slight improvement.  4. COPD and tobacco - continue steroids, bronchodilators, mucolytics and nicotine patch.  5. Chronic pain and opioid dependence - resume home meds.  6. Hyperlipidemia - she is on simvastatin.  7. Steroid induced hyperglycemia and leukocytosis - continue SSI coverage and CBG monitoring.   DVT prophylaxis: enoxaparin Code Status: full  Family Communication: updated telephone 11/23 Disposition: Home   Status is: Inpatient  Remains inpatient appropriate because:IV treatments appropriate due to intensity of illness or inability to take PO and Inpatient level of care appropriate due to severity of illness  Dispo:  Patient From: Home  Planned Disposition: Home  Expected discharge date: 07/24/20  Medically stable for discharge: No  Consultants:   Pulmonary   Procedures:     Antimicrobials:  Ceftriaxone 11/21>> Azithromycin 11/21>>   Subjective: Pt reports nonproductive cough, chest congestion, chest tightness and weakness.   Objective: Vitals:   07/21/20 2100 07/22/20 0501 07/22/20 0900 07/22/20 1500  BP: 126/79 (!) 142/67  (!) 122/57  Pulse:  73  78  Resp: 16 18  20   Temp: 97.6 F (36.4 C) 97.8 F (36.6 C)  97.7 F (36.5 C)  TempSrc: Oral Oral  Oral  SpO2: 97% 97% 97% 91%  Weight:      Height:        Intake/Output Summary (Last 24 hours) at  07/22/2020 1653 Last data filed at 07/22/2020 1300 Gross per 24 hour  Intake 580 ml  Output --  Net 580 ml   Filed Weights   07/20/20 1027  Weight: 64.9 kg    Examination:  General exam: Appears calm and comfortable, NAD. cooperative.  Respiratory system: improving bilateral expiratory wheezing heard. Respiratory effort normal. Cardiovascular system: normal S1 & S2 heard. No JVD, murmurs, rubs, gallops or clicks. No pedal edema. Gastrointestinal system: Abdomen is nondistended, soft and nontender. No organomegaly or masses felt. Normal bowel sounds heard. Central nervous system: Alert and oriented. No focal neurological deficits. Extremities: Symmetric 5 x 5 power. Skin: No rashes, lesions or ulcers Psychiatry: Judgement and insight appear normal. Mood & affect appropriate.   Data Reviewed: I have personally reviewed following labs and imaging studies  CBC: Recent Labs  Lab 07/20/20 1113 07/21/20 0513 07/22/20 0616  WBC 16.5* 11.5* 14.1*  NEUTROABS 14.0* 9.4* 12.0*  HGB 15.0 14.4 13.3  HCT 45.6 47.0* 41.5  MCV 92.3 96.1 92.6  PLT 210 199 184    Basic Metabolic Panel: Recent Labs  Lab 07/20/20 1113 07/21/20 0513 07/22/20 0616  NA 135 141 139  K 4.6 4.7 4.3  CL 102 106 107  CO2 25 23 23   GLUCOSE 158* 116* 134*  BUN 18 18 22*  CREATININE 0.78 0.68 0.59  CALCIUM 8.6* 8.1* 8.3*  MG  --  2.3 2.3  PHOS  --  4.1 3.2    GFR: Estimated Creatinine Clearance: 69 mL/min (by C-G formula based on SCr of 0.59 mg/dL).  Liver Function Tests: Recent Labs  Lab 07/20/20 1113 07/21/20 0513 07/22/20 0616  AST 22 17 11*  ALT 23 21 18   ALKPHOS 75 76 61  BILITOT 0.3 0.4 0.1*  PROT 7.3 6.9 6.4*  ALBUMIN 3.2* 3.0* 2.8*    CBG: Recent Labs  Lab 07/20/20 2126 07/21/20 0754 07/21/20 1114 07/21/20 1615 07/21/20 2110  GLUCAP 164* 150* 152* 191* 139*    Recent Results (from the past 240 hour(s))  Resp Panel by RT-PCR (Flu A&B, Covid) Nasopharyngeal Swab     Status:  Abnormal   Collection Time: 07/20/20 10:43 AM   Specimen: Nasopharyngeal Swab; Nasopharyngeal(NP) swabs in vial transport medium  Result Value Ref Range Status   SARS Coronavirus 2 by RT PCR POSITIVE (A) NEGATIVE Final    Comment: RESULT CALLED TO, READ BACK BY AND VERIFIED WITH: OSBOURN,[email or web address]  BY MATTHEWS,B 11.21.21 (NOTE) SARS-CoV-2 target nucleic acids are DETECTED.  The SARS-CoV-2 RNA is generally detectable in upper respiratory specimens during the acute phase of infection. Positive results are indicative of the presence of the identified virus, but do not rule out bacterial infection or co-infection with other pathogens not detected by the test. Clinical correlation with patient history and other diagnostic information is necessary to determine patient infection status. The expected result is Negative.  Fact Sheet for Patients: 07/23/20  Fact Sheet for Healthcare Providers: 07/22/20  This test is not yet approved or cleared by the 11.23.21 FDA and  has been authorized for detection and/or diagnosis of SARS-CoV-2 by FDA under an Emergency Use Authorization (EUA).  This EUA will remain in effect (meaning  this test ca n be used) for the duration of  the COVID-19 declaration under Section 564(b)(1) of the Act, 21 U.S.C. section 360bbb-3(b)(1), unless the authorization is terminated or revoked sooner.     Influenza A by PCR NEGATIVE NEGATIVE Final   Influenza B by PCR NEGATIVE NEGATIVE Final    Comment: (NOTE) The Xpert Xpress SARS-CoV-2/FLU/RSV plus assay is intended as an aid in the diagnosis of influenza from Nasopharyngeal swab specimens and should not be used as a sole basis for treatment. Nasal washings and aspirates are unacceptable for Xpert Xpress SARS-CoV-2/FLU/RSV testing.  Fact Sheet for Patients: BloggerCourse.com  Fact Sheet for Healthcare  Providers: SeriousBroker.it  This test is not yet approved or cleared by the [place] FDA and has been authorized for detection and/or diagnosis of SARS-CoV-2 by FDA under an Emergency Use Authorization (EUA). This EUA will remain in effect (meaning this test can be used) for the duration of the COVID-19 declaration under Section 564(b)(1) of the Act, 21 U.S.C. section 360bbb-3(b)(1), unless the authorization is terminated or revoked.  Performed at [facility], [address] [place], [place] [address]   Blood Culture (routine x 2)     Status: None (Preliminary result)   Collection Time: 07/20/20 11:13 AM   Specimen: BLOOD  Result Value Ref Range Status   Specimen Description BLOOD BLOOD LEFT ARM  Final   Special Requests   Final    BOTTLES DRAWN AEROBIC AND ANAEROBIC Blood Culture adequate volume   Culture   Final    NO GROWTH 2 DAYS Performed at [facility], [address], [place], [place] [address]    Report Status PENDING  Incomplete  Blood Culture (routine x 2)     Status: None (Preliminary result)   Collection Time: 07/20/20  1:15 PM   Specimen: BLOOD  Result Value Ref Range Status   Specimen Description BLOOD LEFT ANTECUBITAL  Final   Special Requests   Final    BOTTLES DRAWN AEROBIC AND ANAEROBIC Blood Culture adequate volume   Culture   Final    NO GROWTH 2 DAYS Performed at [facility], [address], [place], [place] [address]    Report Status PENDING  Incomplete     Radiology Studies: DG CHEST PORT 1 VIEW  Result Date: 07/22/2020 CLINICAL DATA:  Sepsis.  COVID. EXAM: PORTABLE CHEST 1 VIEW COMPARISON:  CT chest 07/20/2020.  Chest x-ray 07/20/2020. FINDINGS: Stable cardiomegaly. No pulmonary venous congestion. Bilateral interstitial infiltrates again noted. Slight improvement in aeration from prior exam. No pleural effusion or pneumothorax. No acute bony abnormality. IMPRESSION: Bilateral interstitial infiltrates again noted.  Slight improvement in aeration from prior exam. Electronically Signed   By: Maisie Fus  Register   On: 07/22/2020 06:03     Scheduled Meds: . ALPRAZolam  0.25 mg Oral QHS  . amitriptyline  20 mg Oral QHS  . vitamin C  500 mg Oral Daily  . enoxaparin (LOVENOX) injection  40 mg Subcutaneous Q24H  . fluticasone furoate-vilanterol  1 puff Inhalation Daily  . folic acid  1 mg Oral Daily  . gabapentin  600 mg Oral TID  . insulin aspart  0-5 Units Subcutaneous QHS  . insulin aspart  0-9 Units Subcutaneous TID WC  . methylPREDNISolone (SOLU-MEDROL) injection  0.5 mg/kg Intravenous Q12H   Followed by  . [START ON 07/24/2020] predniSONE  50 mg Oral Daily  . multivitamin with minerals  1 tablet Oral Daily  . nicotine  14 mg Transdermal Daily  . pantoprazole  40 mg Oral Daily  . sertraline  150 mg Oral Daily  . simvastatin  10 mg Oral QHS  . sodium chloride flush  3 mL Intravenous Q12H  . thiamine  100 mg Oral Daily  . umeclidinium bromide  1 puff Inhalation Daily  . zinc sulfate  220 mg Oral Daily   Continuous Infusions: . sodium chloride    . azithromycin 500 mg (07/22/20 1611)  . cefTRIAXone (ROCEPHIN)  IV 1 g (07/22/20 1530)  . remdesivir 100 mg in NS 100 mL Stopped (07/22/20 0937)     LOS: 2 days   Time spent: 37 mins   Merideth Bosque Laural BenesJohnson, MD How to contact the [facility] Attending or Consulting provider 7A - 7P or covering provider during after hours 7P -7A, for this patient?  1. Check the care team in [facility] and look for a) attending/consulting [facility] provider listed and b) the [facility] team listed 2. Log into [email or web address] and use [place]'s universal password to access. If you do not have the password, please contact the hospital operator. 3. Locate the [facility] provider you are looking for under [facility] and page to a number that you can be directly reached. 4. If you still have difficulty reaching the provider, please page the [facility] (Director on Call) for the Hospitalists listed on amion for  assistance.  07/22/2020, 4:53 PM

## 2020-07-23 DIAGNOSIS — G894 Chronic pain syndrome: Secondary | ICD-10-CM | POA: Diagnosis not present

## 2020-07-23 DIAGNOSIS — J9601 Acute respiratory failure with hypoxia: Secondary | ICD-10-CM | POA: Diagnosis not present

## 2020-07-23 DIAGNOSIS — U071 COVID-19: Secondary | ICD-10-CM | POA: Diagnosis not present

## 2020-07-23 DIAGNOSIS — Z720 Tobacco use: Secondary | ICD-10-CM | POA: Diagnosis not present

## 2020-07-23 LAB — CBC WITH DIFFERENTIAL/PLATELET
Abs Immature Granulocytes: 0.12 10*3/uL — ABNORMAL HIGH (ref 0.00–0.07)
Basophils Absolute: 0 10*3/uL (ref 0.0–0.1)
Basophils Relative: 0 %
Eosinophils Absolute: 0 10*3/uL (ref 0.0–0.5)
Eosinophils Relative: 0 %
HCT: 43.8 % (ref 36.0–46.0)
Hemoglobin: 13.8 g/dL (ref 12.0–15.0)
Immature Granulocytes: 1 %
Lymphocytes Relative: 13 %
Lymphs Abs: 1.5 10*3/uL (ref 0.7–4.0)
MCH: 29.4 pg (ref 26.0–34.0)
MCHC: 31.5 g/dL (ref 30.0–36.0)
MCV: 93.2 fL (ref 80.0–100.0)
Monocytes Absolute: 0.5 10*3/uL (ref 0.1–1.0)
Monocytes Relative: 4 %
Neutro Abs: 8.9 10*3/uL — ABNORMAL HIGH (ref 1.7–7.7)
Neutrophils Relative %: 82 %
Platelets: 211 10*3/uL (ref 150–400)
RBC: 4.7 MIL/uL (ref 3.87–5.11)
RDW: 14.2 % (ref 11.5–15.5)
WBC: 11 10*3/uL — ABNORMAL HIGH (ref 4.0–10.5)
nRBC: 0 % (ref 0.0–0.2)

## 2020-07-23 LAB — COMPREHENSIVE METABOLIC PANEL
ALT: 19 U/L (ref 0–44)
AST: 13 U/L — ABNORMAL LOW (ref 15–41)
Albumin: 3.1 g/dL — ABNORMAL LOW (ref 3.5–5.0)
Alkaline Phosphatase: 60 U/L (ref 38–126)
Anion gap: 10 (ref 5–15)
BUN: 19 mg/dL (ref 6–20)
CO2: 20 mmol/L — ABNORMAL LOW (ref 22–32)
Calcium: 8.4 mg/dL — ABNORMAL LOW (ref 8.9–10.3)
Chloride: 109 mmol/L (ref 98–111)
Creatinine, Ser: 0.62 mg/dL (ref 0.44–1.00)
GFR, Estimated: >60 mL/min (ref >60)
Glucose, Bld: 139 mg/dL — ABNORMAL HIGH (ref 70–99)
Potassium: 4.2 mmol/L (ref 3.5–5.1)
Sodium: 139 mmol/L (ref 135–145)
Total Bilirubin: 0.2 mg/dL — ABNORMAL LOW (ref 0.3–1.2)
Total Protein: 6.8 g/dL (ref 6.5–8.1)

## 2020-07-23 LAB — D-DIMER, QUANTITATIVE: D-Dimer, Quant: 0.46 ug/mL-FEU (ref 0.00–0.50)

## 2020-07-23 LAB — FERRITIN: Ferritin: 94 ng/mL (ref 11–307)

## 2020-07-23 LAB — GLUCOSE, CAPILLARY
Glucose-Capillary: 119 mg/dL — ABNORMAL HIGH (ref 70–99)
Glucose-Capillary: 173 mg/dL — ABNORMAL HIGH (ref 70–99)
Glucose-Capillary: 275 mg/dL — ABNORMAL HIGH (ref 70–99)

## 2020-07-23 LAB — PHOSPHORUS: Phosphorus: 2.6 mg/dL (ref 2.5–4.6)

## 2020-07-23 LAB — C-REACTIVE PROTEIN: CRP: 5.5 mg/dL — ABNORMAL HIGH (ref <1.0)

## 2020-07-23 LAB — PROCALCITONIN: Procalcitonin: 0.54 ng/mL

## 2020-07-23 LAB — MAGNESIUM: Magnesium: 2.3 mg/dL (ref 1.7–2.4)

## 2020-07-23 MED ORDER — METHYLPREDNISOLONE SODIUM SUCC 125 MG IJ SOLR
60.0000 mg | Freq: Two times a day (BID) | INTRAMUSCULAR | Status: DC
  Administered 2020-07-23 – 2020-07-24 (×2): 60 mg via INTRAVENOUS
  Filled 2020-07-23 (×2): qty 2

## 2020-07-23 MED ORDER — AZITHROMYCIN 250 MG PO TABS
500.0000 mg | ORAL_TABLET | Freq: Every day | ORAL | Status: AC
  Administered 2020-07-23 – 2020-07-24 (×2): 500 mg via ORAL
  Filled 2020-07-23 (×2): qty 2

## 2020-07-23 NOTE — Progress Notes (Signed)
PROGRESS NOTE  Paula Bailey VQM:[id number] DOB: June 14, 1961 DOA: 07/20/2020 PCP: Ignatius Specking, MD  Brief History:  59 y.o.femalewith past medical history relevant for ongoing tobacco abuse, COPD, depression and anxiety, osteoporosis,chronic pain syndrome and chronic opiate and benzo use,HLD who presents to the ED on 11/21/2021with complaints offever, cough, headache, chest pain with deep breaths, and vomiting since last night  -Please note that patient had acute respiratory failure in February 2021, she was actually intubated on 10/12/2019 and extubated on 10/16/2019---she has done well since discharge without need for home O2  -In the ED patient is found to be hypoxic requiring up to 4 L of oxygen via nasal cannula -The ED patient is also found to be tachypneic and tachycardic, she did have low-grade temps and subjective fevers -- Patient completed 2 doses of ModernaCOVID-19 vaccine on 12/25/2019--- -patient also got her flu vaccine   -inED WBC 16.5,chest x-ray suggestive of pneumonia(Rt > Lt) ---procalcitonin is elevated at 6.0,lactic acid is elevated at 2.8 --Inflammatory markers are elevated with CRP 34.3,D-dimer of 2.24,LDH of 431, -fibrinogen greater than 800 -COVID-19 positive, influenza negative -Antibiotics administered, blood cultures requested, CTA chest to rule out underlying PE given elevated D-dimer, acute onset of shortness of breath tachycardia and tachypnea with hypoxia  Assessment/Plan:  Acute Respiratory Failure with Hypoxia due to COVID-19 -stable on RA -initially on 4L -finish remdesivir on 11/25 -CRP 27.8>>12.3>>5.5 -D-dimer--1.49>>0.65>>0.46 -Ferritin 130>>97>>94 -continue IV solumedrol -CTA chest--neg PE; diffuse bilateral infiltrates in all lobes, mostly GGO but few areas of more focal consolidation  Severe sepsis -present on admission -due to pneumonia -lactate peaked 2.8 -continue ceftriaxone and  azithro  Hyperlipidemia -continue zocor -continue incruse/Breo  COPD and tobacco abuse -tobacco cessation discussed   Depression and Anxiety -continue zoloft, xanax -continue hs Elavil  Chronic pain syndrome -continue home dose        Status is: Inpatient  Remains inpatient appropriate because:IV treatments appropriate due to intensity of illness or inability to take PO   Dispo:  Patient From: Home  Planned Disposition: Home  Expected discharge date: 07/24/20  Medically stable for discharge: No         Family Communication:  no Family at bedside  Consultants:  none  Code Status:  FULL   DVT Prophylaxis:[place] Lovenox   Procedures: As Listed in Progress Note Above  Antibiotics: Ceftriaxone 11/22>> azithro  11/22>>     Subjective: Patient denies fevers, chills, headache, chest pain, dyspnea, nausea, vomiting, diarrhea, abdominal pain, dysuria, hematuria, hematochezia, and melena.   Objective: Vitals:   07/22/20 0900 07/22/20 1500 07/22/20 2121 07/23/20 1117  BP:  (!) 122/57 136/67   Pulse:  78 65   Resp:  20 16   Temp:  97.7 F (36.5 C) 97.7 F (36.5 C)   TempSrc:  Oral Oral   SpO2: 97% 91% 94% 95%  Weight:      Height:        Intake/Output Summary (Last 24 hours) at 07/23/2020 1220 Last data filed at 07/23/2020 1010 Gross per 24 hour  Intake 1130 ml  Output --  Net 1130 ml   Weight change:  Exam:   General:  Pt is alert, follows commands appropriately, not in acute distress  HEENT: No icterus, No thrush, No neck mass, [place]/AT  Cardiovascular: RRR, S1/S2, no rubs, no gallops  Respiratory: bibasilar rales. No wheeze  Abdomen: Soft/+BS, non tender, non distended, no guarding  Extremities: No edema, No lymphangitis, No petechiae,  No rashes, no synovitis   Data Reviewed: I have personally reviewed following labs and imaging studies Basic Metabolic Panel: Recent Labs  Lab 07/20/20 1113 07/21/20 0513 07/22/20 0616  07/23/20 0808  NA 135 141 139 139  K 4.6 4.7 4.3 4.2  CL 102 106 107 109  CO2 25 23 23  20*  GLUCOSE 158* 116* 134* 139*  BUN 18 18 22* 19  CREATININE 0.78 0.68 0.59 0.62  CALCIUM 8.6* 8.1* 8.3* 8.4*  MG  --  2.3 2.3 2.3  PHOS  --  4.1 3.2 2.6   Liver Function Tests: Recent Labs  Lab 07/20/20 1113 07/21/20 0513 07/22/20 0616 07/23/20 0808  AST 22 17 11* 13*  ALT 23 21 18 19   ALKPHOS 75 76 61 60  BILITOT 0.3 0.4 0.1* 0.2*  PROT 7.3 6.9 6.4* 6.8  ALBUMIN 3.2* 3.0* 2.8* 3.1*   No results for input(s): LIPASE, AMYLASE in the last 168 hours. No results for input(s): AMMONIA in the last 168 hours. Coagulation Profile: No results for input(s): INR, PROTIME in the last 168 hours. CBC: Recent Labs  Lab 07/20/20 1113 07/21/20 0513 07/22/20 0616 07/23/20 0808  WBC 16.5* 11.5* 14.1* 11.0*  NEUTROABS 14.0* 9.4* 12.0* 8.9*  HGB 15.0 14.4 13.3 13.8  HCT 45.6 47.0* 41.5 43.8  MCV 92.3 96.1 92.6 93.2  PLT 210 199 184 211   Cardiac Enzymes: No results for input(s): CKTOTAL, CKMB, CKMBINDEX, TROPONINI in the last 168 hours. BNP: Invalid input(s): POCBNP CBG: Recent Labs  Lab 07/21/20 1615 07/21/20 2110 07/22/20 2118 07/23/20 0733 07/23/20 1127  GLUCAP 191* 139* 161* 119* 173*   HbA1C: No results for input(s): HGBA1C in the last 72 hours. Urine analysis:    Component Value Date/Time   COLORURINE YELLOW 10/11/2019 2349   APPEARANCEUR CLEAR 10/11/2019 2349   LABSPEC 1.023 10/11/2019 2349   PHURINE 6.0 10/11/2019 2349   GLUCOSEU NEGATIVE 10/11/2019 2349   HGBUR MODERATE (A) 10/11/2019 2349   BILIRUBINUR NEGATIVE 10/11/2019 2349   KETONESUR NEGATIVE 10/11/2019 2349   PROTEINUR NEGATIVE 10/11/2019 2349   NITRITE NEGATIVE 10/11/2019 2349   LEUKOCYTESUR NEGATIVE 10/11/2019 2349   Sepsis Labs: @LABRCNTIP (procalcitonin:4,lacticidven:4) ) Recent Results (from the past 240 hour(s))  Resp Panel by RT-PCR (Flu A&B, Covid) Nasopharyngeal Swab     Status: Abnormal    Collection Time: 07/20/20 10:43 AM   Specimen: Nasopharyngeal Swab; Nasopharyngeal(NP) swabs in vial transport medium  Result Value Ref Range Status   SARS Coronavirus 2 by RT PCR POSITIVE (A) NEGATIVE Final    Comment: RESULT CALLED TO, READ BACK BY AND VERIFIED WITH: OSBOURN,[email or web address]  BY MATTHEWS,B 11.21.21 (NOTE) SARS-CoV-2 target nucleic acids are DETECTED.  The SARS-CoV-2 RNA is generally detectable in upper respiratory specimens during the acute phase of infection. Positive results are indicative of the presence of the identified virus, but do not rule out bacterial infection or co-infection with other pathogens not detected by the test. Clinical correlation with patient history and other diagnostic information is necessary to determine patient infection status. The expected result is Negative.  Fact Sheet for Patients: BloggerCourse.comhttps://www.fda.gov/media/152166/download  Fact Sheet for Healthcare Providers: SeriousBroker.ithttps://www.fda.gov/media/152162/download  This test is not yet approved or cleared by the [place] FDA and  has been authorized for detection and/or diagnosis of SARS-CoV-2 by FDA under an Emergency Use Authorization (EUA).  This EUA will remain in effect (meaning this test ca n be used) for the duration of  the COVID-19 declaration under Section 564(b)(1) of the Act, 21 U.S.C. section 360bbb-3(b)(1),  unless the authorization is terminated or revoked sooner.     Influenza A by PCR NEGATIVE NEGATIVE Final   Influenza B by PCR NEGATIVE NEGATIVE Final    Comment: (NOTE) The Xpert Xpress SARS-CoV-2/FLU/RSV plus assay is intended as an aid in the diagnosis of influenza from Nasopharyngeal swab specimens and should not be used as a sole basis for treatment. Nasal washings and aspirates are unacceptable for Xpert Xpress SARS-CoV-2/FLU/RSV testing.  Fact Sheet for Patients: BloggerCourse.com  Fact Sheet for Healthcare  Providers: SeriousBroker.it  This test is not yet approved or cleared by the [place] FDA and has been authorized for detection and/or diagnosis of SARS-CoV-2 by FDA under an Emergency Use Authorization (EUA). This EUA will remain in effect (meaning this test can be used) for the duration of the COVID-19 declaration under Section 564(b)(1) of the Act, 21 U.S.C. section 360bbb-3(b)(1), unless the authorization is terminated or revoked.  Performed at [facility], [address] [place], [place] [address]   Blood Culture (routine x 2)     Status: None (Preliminary result)   Collection Time: 07/20/20 11:13 AM   Specimen: BLOOD  Result Value Ref Range Status   Specimen Description BLOOD BLOOD LEFT ARM  Final   Special Requests   Final    BOTTLES DRAWN AEROBIC AND ANAEROBIC Blood Culture adequate volume   Culture   Final    NO GROWTH 2 DAYS Performed at [facility], [address] [place], [place] [address]    Report Status PENDING  Incomplete  Blood Culture (routine x 2)     Status: None (Preliminary result)   Collection Time: 07/20/20  1:15 PM   Specimen: BLOOD  Result Value Ref Range Status   Specimen Description BLOOD LEFT ANTECUBITAL  Final   Special Requests   Final    BOTTLES DRAWN AEROBIC AND ANAEROBIC Blood Culture adequate volume   Culture   Final    NO GROWTH 2 DAYS Performed at [facility], [address], [place], [place] [address]    Report Status PENDING  Incomplete     Scheduled Meds: . ALPRAZolam  0.25 mg Oral QHS  . amitriptyline  20 mg Oral QHS  . vitamin C  500 mg Oral Daily  . azithromycin  500 mg Oral Daily  . enoxaparin (LOVENOX) injection  40 mg Subcutaneous Q24H  . fluticasone furoate-vilanterol  1 puff Inhalation Daily  . folic acid  1 mg Oral Daily  . gabapentin  600 mg Oral TID  . insulin aspart  0-5 Units Subcutaneous QHS  . insulin aspart  0-9 Units Subcutaneous TID WC  . methylPREDNISolone (SOLU-MEDROL)  injection  60 mg Intravenous Q12H  . multivitamin with minerals  1 tablet Oral Daily  . nicotine  14 mg Transdermal Daily  . pantoprazole  40 mg Oral Daily  . sertraline  150 mg Oral Daily  . simvastatin  10 mg Oral QHS  . sodium chloride flush  3 mL Intravenous Q12H  . thiamine  100 mg Oral Daily  . umeclidinium bromide  1 puff Inhalation Daily  . zinc sulfate  220 mg Oral Daily   Continuous Infusions: . sodium chloride    . cefTRIAXone (ROCEPHIN)  IV 1 g (07/22/20 1530)  . remdesivir 100 mg in NS 100 mL 100 mg (07/23/20 0927)    Procedures/Studies: CT ANGIO CHEST PE W OR WO CONTRAST  Result Date: 07/20/2020 CLINICAL DATA:  Evaluate for pulmonary embolus. Fever and cough. Chest pain. By report, the patient  is positive for COVID-19. EXAM: CT ANGIOGRAPHY CHEST WITH CONTRAST TECHNIQUE: Multidetector CT imaging of the chest was performed using the standard protocol during bolus administration of intravenous contrast. Multiplanar CT image reconstructions and MIPs were obtained to evaluate the vascular anatomy. CONTRAST:  OMNIPAQUE IOHEXOL 350 MG/ML SOLN COMPARISON:  Chest x-ray from earlier today. CT pulmonary angiogram October 11, 2019. FINDINGS: Cardiovascular: Mild calcified atherosclerosis in the thoracic aorta. The thoracic aorta is nonaneurysmal and there is no evidence of dissection. Cardiomegaly identified. The main pulmonary artery measures 3.1 cm. No pulmonary emboli are identified. No pulmonary emboli. Mediastinum/Nodes: Small bilateral pleural effusions are identified. No pericardial effusion. The esophagus and thyroid are normal. Mediastinal adenopathy is similar in the interval. Right greater than left hilar adenopathy is similar as well. The chest wall is normal. Lungs/Pleura: The central airways are normal. No pneumothorax. Patchy bilateral pulmonary infiltrates are identified. There are similar in character to the infiltrates seen in February of 2021. However, within the  regions of ground-glass, there are some focal regions of denser consolidation. All lobes are and affected. No nodules or masses. Upper Abdomen: No acute abnormality. Musculoskeletal: No chest wall abnormality. No acute or significant osseous findings. Review of the MIP images confirms the above findings. IMPRESSION: 1. No pulmonary emboli. 2. Diffuse bilateral pulmonary infiltrates involving all lobes. In February 2021, the infiltrates were almost completely ground-glass. Today, the infiltrates are mostly ground-glass but do contain areas of denser or more focal consolidation. The findings are not specific. Given the history of COVID-19, the findings could represent COVID-19 pneumonia. Other atypical infections could have a similar appearance. There are other inflammatory conditions which could have a similar appearance. Pulmonary edema should also be considered. 3. Adenopathy in the mediastinum is similar since February 2021. Given the lung findings, I suspect the nodes are reactive. Recommend short-term follow-up imaging after treatment of the patient's pulmonary process to ensure the lymph nodes return to normal. 4. Mild calcified atherosclerosis in the thoracic aorta. 5. Cardiomegaly. 6. The pulmonary artery measures 3.1 cm raising the possibility of pulmonary arterial hypertension. 7. Small bilateral pleural effusions. 8. No other abnormalities. Aortic Atherosclerosis (ICD10-I70.0). Electronically Signed   By: Gerome Sam III M.D   On: 07/20/2020 16:44   DG CHEST PORT 1 VIEW  Result Date: 07/22/2020 CLINICAL DATA:  Sepsis.  COVID. EXAM: PORTABLE CHEST 1 VIEW COMPARISON:  CT chest 07/20/2020.  Chest x-ray 07/20/2020. FINDINGS: Stable cardiomegaly. No pulmonary venous congestion. Bilateral interstitial infiltrates again noted. Slight improvement in aeration from prior exam. No pleural effusion or pneumothorax. No acute bony abnormality. IMPRESSION: Bilateral interstitial infiltrates again noted. Slight  improvement in aeration from prior exam. Electronically Signed   By: Maisie Fus  Register   On: 07/22/2020 06:03   DG Chest Port 1 View  Result Date: 07/20/2020 CLINICAL DATA:  Shortness of breath and fever. EXAM: PORTABLE CHEST 1 VIEW COMPARISON:  Jan 10, 2020 FINDINGS: New infiltrate is seen in the right mid lower lung. Suggested minimal opacity in left base. The heart, hila, mediastinum, lungs, and pleura are otherwise unchanged. IMPRESSION: Right mid lower lung infiltrate worrisome for pneumonia given history. Possible minimal opacity in left base is well. Recommend short-term follow-up imaging to ensure resolution. Electronically Signed   By: Gerome Sam III M.D   On: 07/20/2020 11:23    Catarina Hartshorn, DO  [facility]  If 7PM-7AM, please contact night-coverage [email or web address] Password [email or web address] 07/23/2020, 12:20 PM   LOS: 3 days

## 2020-07-23 NOTE — Care Management Important Message (Signed)
Important Message  Patient Details  Name: Paula Bailey MRN: [id number] Date of Birth: 09-29-1960   Medicare Important Message Given:  Yes - Important Message mailed due to current National Emergency     Corey Harold 07/23/2020, 4:01 PM

## 2020-07-23 NOTE — Plan of Care (Signed)

## 2020-07-23 NOTE — Evaluation (Signed)
Physical Therapy Evaluation Patient Details Name: Jairy Angulo MRN: [id number] DOB: 1961-03-14 Today's Date: 07/23/2020   History of Present Illness  Loriene Taunton  is a 59 y.o. female with past medical history relevant for ongoing tobacco abuse, COPD, depression and anxiety, osteoporosis, chronic pain syndrome and chronic opiate and benzo use, HLD who presents to the ED on 07/20/2020 with complaints of fever, cough, headache, chest pain with deep breaths, and vomiting since last night -Please note that patient had acute respiratory failure in February 2021, she was actually intubated on 10/12/2019 and extubated on 10/16/2019--- she has done well since discharge without need for home O2    Clinical Impression  Patient functioning at baseline for functional mobility/gait and demonstrates good return for ambulating in room, transferring to commode without loss of balance.  Plan:  Patient discharged from physical therapy to care of nursing for ambulation daily as tolerated for length of stay.     Follow Up Recommendations No PT follow up;Supervision - Intermittent    Equipment Recommendations  None recommended by PT    Recommendations for Other Services       Precautions / Restrictions Precautions Precautions: None Restrictions Weight Bearing Restrictions: No      Mobility  Bed Mobility Overal bed mobility: Modified Independent                  Transfers Overall transfer level: Modified independent                  Ambulation/Gait Ambulation/Gait assistance: Modified independent (Device/Increase time) Gait Distance (Feet): 100 Feet Assistive device: None Gait Pattern/deviations: WFL(Within Functional Limits) Gait velocity: slightly decreased   General Gait Details: demonstrates good return for ambulation in room without loss of balance while on room air with SpO2 at 96%  Stairs            Wheelchair Mobility    Modified Rankin (Stroke Patients  Only)       Balance Overall balance assessment: No apparent balance deficits (not formally assessed)                                           Pertinent Vitals/Pain Pain Assessment: 0-10 Pain Score: 2  Pain Location: chronic low back pain Pain Descriptors / Indicators: Aching;Discomfort Pain Intervention(s): Limited activity within patient's tolerance;Monitored during session    Home Living Family/patient expects to be discharged to:: Private residence Living Arrangements: Other relatives Available Help at Discharge: Family;Available 24 hours/day Type of Home: House Home Access: Level entry     Home Layout: One level Home Equipment: Grab bars - tub/shower      Prior Function Level of Independence: Independent         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, drives, does own shopping sometimes     Hand Dominance   Dominant Hand: Right    Extremity/Trunk Assessment   Upper Extremity Assessment Upper Extremity Assessment: Overall WFL for tasks assessed    Lower Extremity Assessment Lower Extremity Assessment: Overall WFL for tasks assessed    Cervical / Trunk Assessment Cervical / Trunk Assessment: Normal  Communication   Communication: No difficulties  Cognition Arousal/Alertness: Awake/alert Behavior During Therapy: WFL for tasks assessed/performed Overall Cognitive Status: Within Functional Limits for tasks assessed  General Comments      Exercises     Assessment/Plan    PT Assessment Patent does not need any further PT services  PT Problem List         PT Treatment Interventions      PT Goals (Current goals can be found in the Care Plan section)  Acute Rehab PT Goals Patient Stated Goal: return home with family to assist PT Goal Formulation: With patient Time For Goal Achievement: 07/23/20 Potential to Achieve Goals: Good    Frequency     Barriers to discharge         Co-evaluation               AM-PAC PT "6 Clicks" Mobility  Outcome Measure Help needed turning from your back to your side while in a flat bed without using bedrails?: None Help needed moving from lying on your back to sitting on the side of a flat bed without using bedrails?: None Help needed moving to and from a bed to a chair (including a wheelchair)?: None Help needed standing up from a chair using your arms (e.g., wheelchair or bedside chair)?: None Help needed to walk in hospital room?: None Help needed climbing 3-5 steps with a railing? : None 6 Click Score: 24    End of Session   Activity Tolerance: Patient tolerated treatment well Patient left: in bed;with call bell/phone within reach Nurse Communication: Mobility status PT Visit Diagnosis: Unsteadiness on feet (R26.81);Other abnormalities of gait and mobility (R26.89);Muscle weakness (generalized) (M62.81)    Time: 9937-1696 PT Time Calculation (min) (ACUTE ONLY): 22 min   Charges:   PT Evaluation $PT Eval Moderate Complexity: 1 Mod PT Treatments $Therapeutic Activity: 8-22 mins        9:50 AM, 07/23/20 Ocie Bob, MPT Physical Therapist with [facility] 336 [phone] office [phone] mobile phone

## 2020-07-24 DIAGNOSIS — J069 Acute upper respiratory infection, unspecified: Secondary | ICD-10-CM | POA: Diagnosis not present

## 2020-07-24 DIAGNOSIS — Z720 Tobacco use: Secondary | ICD-10-CM | POA: Diagnosis not present

## 2020-07-24 DIAGNOSIS — U071 COVID-19: Secondary | ICD-10-CM | POA: Diagnosis not present

## 2020-07-24 DIAGNOSIS — J9601 Acute respiratory failure with hypoxia: Secondary | ICD-10-CM | POA: Diagnosis not present

## 2020-07-24 LAB — CBC WITH DIFFERENTIAL/PLATELET
Abs Immature Granulocytes: 0.22 10*3/uL — ABNORMAL HIGH (ref 0.00–0.07)
Basophils Absolute: 0 10*3/uL (ref 0.0–0.1)
Basophils Relative: 0 %
Eosinophils Absolute: 0 10*3/uL (ref 0.0–0.5)
Eosinophils Relative: 0 %
HCT: 42.6 % (ref 36.0–46.0)
Hemoglobin: 13.7 g/dL (ref 12.0–15.0)
Immature Granulocytes: 2 %
Lymphocytes Relative: 14 %
Lymphs Abs: 1.8 10*3/uL (ref 0.7–4.0)
MCH: 29.8 pg (ref 26.0–34.0)
MCHC: 32.2 g/dL (ref 30.0–36.0)
MCV: 92.8 fL (ref 80.0–100.0)
Monocytes Absolute: 0.5 10*3/uL (ref 0.1–1.0)
Monocytes Relative: 4 %
Neutro Abs: 9.6 10*3/uL — ABNORMAL HIGH (ref 1.7–7.7)
Neutrophils Relative %: 80 %
Platelets: 253 10*3/uL (ref 150–400)
RBC: 4.59 MIL/uL (ref 3.87–5.11)
RDW: 14.2 % (ref 11.5–15.5)
WBC: 12.2 10*3/uL — ABNORMAL HIGH (ref 4.0–10.5)
nRBC: 0 % (ref 0.0–0.2)

## 2020-07-24 LAB — COMPREHENSIVE METABOLIC PANEL
ALT: 22 U/L (ref 0–44)
AST: 14 U/L — ABNORMAL LOW (ref 15–41)
Albumin: 2.9 g/dL — ABNORMAL LOW (ref 3.5–5.0)
Alkaline Phosphatase: 57 U/L (ref 38–126)
Anion gap: 9 (ref 5–15)
BUN: 20 mg/dL (ref 6–20)
CO2: 21 mmol/L — ABNORMAL LOW (ref 22–32)
Calcium: 8.1 mg/dL — ABNORMAL LOW (ref 8.9–10.3)
Chloride: 106 mmol/L (ref 98–111)
Creatinine, Ser: 0.6 mg/dL (ref 0.44–1.00)
GFR, Estimated: >60 mL/min (ref >60)
Glucose, Bld: 163 mg/dL — ABNORMAL HIGH (ref 70–99)
Potassium: 4.2 mmol/L (ref 3.5–5.1)
Sodium: 136 mmol/L (ref 135–145)
Total Bilirubin: 0.2 mg/dL — ABNORMAL LOW (ref 0.3–1.2)
Total Protein: 6.7 g/dL (ref 6.5–8.1)

## 2020-07-24 LAB — GLUCOSE, CAPILLARY
Glucose-Capillary: 138 mg/dL — ABNORMAL HIGH (ref 70–99)
Glucose-Capillary: 188 mg/dL — ABNORMAL HIGH (ref 70–99)
Glucose-Capillary: 133 mg/dL — ABNORMAL HIGH (ref 70–99)

## 2020-07-24 LAB — MAGNESIUM: Magnesium: 2.4 mg/dL (ref 1.7–2.4)

## 2020-07-24 LAB — PHOSPHORUS: Phosphorus: 3.4 mg/dL (ref 2.5–4.6)

## 2020-07-24 LAB — FERRITIN: Ferritin: 76 ng/mL (ref 11–307)

## 2020-07-24 LAB — C-REACTIVE PROTEIN: CRP: 3.1 mg/dL — ABNORMAL HIGH (ref <1.0)

## 2020-07-24 LAB — D-DIMER, QUANTITATIVE: D-Dimer, Quant: 0.46 ug/mL-FEU (ref 0.00–0.50)

## 2020-07-24 MED ORDER — PREDNISONE 20 MG PO TABS: 60.0000 mg | ORAL_TABLET | Freq: Two times a day (BID) | ORAL | Status: DC

## 2020-07-24 MED ORDER — CEFDINIR 300 MG PO CAPS: 300.0000 mg | ORAL_CAPSULE | Freq: Two times a day (BID) | ORAL | 0 refills | Status: DC

## 2020-07-24 MED ORDER — PREDNISONE 20 MG PO TABS
60.0000 mg | ORAL_TABLET | Freq: Two times a day (BID) | ORAL | 0 refills | Status: DC
Start: 2020-07-24 — End: 2021-10-19

## 2020-07-24 MED ORDER — AZITHROMYCIN 500 MG PO TABS: 500.0000 mg | ORAL_TABLET | Freq: Every day | ORAL | 0 refills | Status: DC

## 2020-07-24 NOTE — Discharge Summary (Signed)
Physician Discharge Summary  Paula Bailey ZOX:[id number] DOB: 1960/12/12 DOA: 07/20/2020  PCP: Ignatius Specking, MD  Admit date: 07/20/2020 Discharge date: 07/24/2020  Admitted From: Home Disposition:  Home   Recommendations for Outpatient Follow-up:  1. Follow up with PCP in 1-2 weeks 2. Please obtain BMP/CBC in one week     Discharge Condition: Stable CODE STATUS: FULL Diet recommendation: Heart Healthy / Carb Modified    Brief/Interim Summary: 59 y.o.femalewith past medical history relevant for ongoing tobacco abuse, COPD, depression and anxiety, osteoporosis,chronic pain syndrome and chronic opiate and benzo use,HLD who presents to the ED on 11/21/2021with complaints offever, cough, headache, chest pain with deep breaths, and vomiting since last night  -Please note that patient had acute respiratory failure in February 2021, she was actually intubated on 10/12/2019 and extubated on 10/16/2019---she has done well since discharge without need for home O2  -In the ED patient is found to be hypoxic requiring up to 4 L of oxygen via nasal cannula -The ED patient is also found to be tachypneic and tachycardic, she did have low-grade temps and subjective fevers -- Patient completed 2 doses of ModernaCOVID-19 vaccine on 12/25/2019--- -patient also got her flu vaccine   -inED WBC 16.5,chest x-ray suggestive of pneumonia(Rt > Lt) ---procalcitonin is elevated at 6.0,lactic acid is elevated at 2.8 --Inflammatory markers are elevated with CRP 34.3,D-dimer of 2.24,LDH of 431, -fibrinogen greater than 800 -COVID-19 positive, influenza negative -Antibiotics administered, blood cultures requested, CTA chest to rule out underlying PE given elevated D-dimer, acute onset of shortness of breath tachycardia and tachypnea with hypoxia  Discharge Diagnoses:  Acute Respiratory Failure with Hypoxia due to COVID-19 -stable on RA at time of d/c -initially on 4L -finished  remdesivir on 11/25 -CRP 27.8>>12.3>>5.5 -D-dimer--1.49>>0.65>>0.46 -Ferritin 130>>97>>94 -continue IV solumedrol -CTA chest--neg PE; diffuse bilateral infiltrates in all lobes, mostly GGO but few areas of more focal consolidation  Severe sepsis -present on admission -due to pneumonia -lactate peaked 2.8 -sepsis physiology resolved -continue ceftriaxone and azithro -d/c home with 2 more days azithro and cefdinir  Hyperlipidemia -continue zocor  COPD and tobacco abuse -tobacco cessation discussed  -continue incruse/Breo  Depression and Anxiety -continue zoloft, xanax -continue hs Elavil  Chronic pain syndrome -continue home dose meds  Discharge Instructions   Allergies as of 07/24/2020      Reactions   Hydrocodone Rash      Medication List    TAKE these medications   alendronate 70 MG tablet Commonly known as: FOSAMAX Take 70 mg by mouth once a week.   ALPRAZolam 0.5 MG tablet Commonly known as: XANAX Take 0.25 mg by mouth at bedtime.   amitriptyline 10 MG tablet Commonly known as: ELAVIL Take 20 mg by mouth at bedtime.   azithromycin 500 MG tablet Commonly known as: ZITHROMAX Take 1 tablet (500 mg total) by mouth daily. Start taking on: July 25, 2020   Breo Ellipta 200-25 MCG/INH Aepb Generic drug: fluticasone furoate-vilanterol INHALE ONE PUFF EVERY DAY What changed: See the new instructions.   cefdinir 300 MG capsule Commonly known as: OMNICEF Take 1 capsule (300 mg total) by mouth 2 (two) times daily.   fluticasone 50 MCG/ACT nasal spray Commonly known as: FLONASE Place 2 sprays into both nostrils daily.   gabapentin 300 MG capsule Commonly known as: NEURONTIN Take 600 mg by mouth 3 (three) times daily.   ibuprofen 800 MG tablet Commonly known as: ADVIL Take 800 mg by mouth every 8 (eight) hours as needed for mild pain.  Incruse Ellipta 62.5 MCG/INH Aepb Generic drug: umeclidinium bromide INHALE ONE PUFF EVERY DAY What  changed: See the new instructions.   methocarbamol 500 MG tablet Commonly known as: ROBAXIN Take 500 mg by mouth 3 (three) times daily as needed for muscle spasms.   oxyCODONE-acetaminophen 7.5-325 MG tablet Commonly known as: PERCOCET Take 1 tablet by mouth every 6 (six) hours as needed for moderate pain or severe pain.   predniSONE 20 MG tablet Commonly known as: DELTASONE Take 3 tablets (60 mg total) by mouth 2 (two) times daily with a meal.   sertraline 100 MG tablet Commonly known as: ZOLOFT Take 1.5 tablets (150 mg total) by mouth daily. Start taking on: August 17, 2020   simvastatin 10 MG tablet Commonly known as: ZOCOR Take 10 mg by mouth at bedtime.   Ventolin HFA 108 (90 Base) MCG/ACT inhaler Generic drug: albuterol Inhale 1-2 puffs into the lungs every 4 (four) hours as needed for wheezing or shortness of breath.       Allergies  Allergen Reactions  . Hydrocodone Rash    Consultations:  none   Procedures/Studies: CT ANGIO CHEST PE W OR WO CONTRAST  Result Date: 07/20/2020 CLINICAL DATA:  Evaluate for pulmonary embolus. Fever and cough. Chest pain. By report, the patient is positive for COVID-19. EXAM: CT ANGIOGRAPHY CHEST WITH CONTRAST TECHNIQUE: Multidetector CT imaging of the chest was performed using the standard protocol during bolus administration of intravenous contrast. Multiplanar CT image reconstructions and MIPs were obtained to evaluate the vascular anatomy. CONTRAST:  OMNIPAQUE IOHEXOL 350 MG/ML SOLN COMPARISON:  Chest x-ray from earlier today. CT pulmonary angiogram October 11, 2019. FINDINGS: Cardiovascular: Mild calcified atherosclerosis in the thoracic aorta. The thoracic aorta is nonaneurysmal and there is no evidence of dissection. Cardiomegaly identified. The main pulmonary artery measures 3.1 cm. No pulmonary emboli are identified. No pulmonary emboli. Mediastinum/Nodes: Small bilateral pleural effusions are identified. No  pericardial effusion. The esophagus and thyroid are normal. Mediastinal adenopathy is similar in the interval. Right greater than left hilar adenopathy is similar as well. The chest wall is normal. Lungs/Pleura: The central airways are normal. No pneumothorax. Patchy bilateral pulmonary infiltrates are identified. There are similar in character to the infiltrates seen in February of 2021. However, within the regions of ground-glass, there are some focal regions of denser consolidation. All lobes are and affected. No nodules or masses. Upper Abdomen: No acute abnormality. Musculoskeletal: No chest wall abnormality. No acute or significant osseous findings. Review of the MIP images confirms the above findings. IMPRESSION: 1. No pulmonary emboli. 2. Diffuse bilateral pulmonary infiltrates involving all lobes. In February 2021, the infiltrates were almost completely ground-glass. Today, the infiltrates are mostly ground-glass but do contain areas of denser or more focal consolidation. The findings are not specific. Given the history of COVID-19, the findings could represent COVID-19 pneumonia. Other atypical infections could have a similar appearance. There are other inflammatory conditions which could have a similar appearance. Pulmonary edema should also be considered. 3. Adenopathy in the mediastinum is similar since February 2021. Given the lung findings, I suspect the nodes are reactive. Recommend short-term follow-up imaging after treatment of the patient's pulmonary process to ensure the lymph nodes return to normal. 4. Mild calcified atherosclerosis in the thoracic aorta. 5. Cardiomegaly. 6. The pulmonary artery measures 3.1 cm raising the possibility of pulmonary arterial hypertension. 7. Small bilateral pleural effusions. 8. No other abnormalities. Aortic Atherosclerosis (ICD10-I70.0). Electronically Signed   By: Gerome Sam III M.D  On: 07/20/2020 16:44   DG CHEST PORT 1 VIEW  Result Date:  07/22/2020 CLINICAL DATA:  Sepsis.  COVID. EXAM: PORTABLE CHEST 1 VIEW COMPARISON:  CT chest 07/20/2020.  Chest x-ray 07/20/2020. FINDINGS: Stable cardiomegaly. No pulmonary venous congestion. Bilateral interstitial infiltrates again noted. Slight improvement in aeration from prior exam. No pleural effusion or pneumothorax. No acute bony abnormality. IMPRESSION: Bilateral interstitial infiltrates again noted. Slight improvement in aeration from prior exam. Electronically Signed   By: Maisie Fus  Register   On: 07/22/2020 06:03   DG Chest Port 1 View  Result Date: 07/20/2020 CLINICAL DATA:  Shortness of breath and fever. EXAM: PORTABLE CHEST 1 VIEW COMPARISON:  Jan 10, 2020 FINDINGS: New infiltrate is seen in the right mid lower lung. Suggested minimal opacity in left base. The heart, hila, mediastinum, lungs, and pleura are otherwise unchanged. IMPRESSION: Right mid lower lung infiltrate worrisome for pneumonia given history. Possible minimal opacity in left base is well. Recommend short-term follow-up imaging to ensure resolution. Electronically Signed   By: Gerome Sam III M.D   On: 07/20/2020 11:23        Discharge Exam: Vitals:   07/24/20 0821 07/24/20 1059  BP: (!) 141/71   Pulse: (!) 55   Resp: 16   Temp: 97.7 F (36.5 C)   SpO2: 99% (!) 5%   Vitals:   07/23/20 2237 07/24/20 0550 07/24/20 0821 07/24/20 1059  BP: 127/64 (!) 158/75 (!) 141/71   Pulse: 66 (!) 57 (!) 55   Resp:  16 16   Temp: 98 F (36.7 C) 97.9 F (36.6 C) 97.7 F (36.5 C)   TempSrc:   Oral   SpO2: 96% 100% 99% (!) 5%  Weight:      Height:        General: Pt is alert, awake, not in acute distress Cardiovascular: RRR, S1/S2 +, no rubs, no gallops Respiratory: bibasilar rales.  No wheeze Abdominal: Soft, NT, ND, bowel sounds + Extremities: no edema, no cyanosis   The results of significant diagnostics from this hospitalization (including imaging, microbiology, ancillary and laboratory) are listed below  for reference.    Significant Diagnostic Studies: CT ANGIO CHEST PE W OR WO CONTRAST  Result Date: 07/20/2020 CLINICAL DATA:  Evaluate for pulmonary embolus. Fever and cough. Chest pain. By report, the patient is positive for COVID-19. EXAM: CT ANGIOGRAPHY CHEST WITH CONTRAST TECHNIQUE: Multidetector CT imaging of the chest was performed using the standard protocol during bolus administration of intravenous contrast. Multiplanar CT image reconstructions and MIPs were obtained to evaluate the vascular anatomy. CONTRAST:  OMNIPAQUE IOHEXOL 350 MG/ML SOLN COMPARISON:  Chest x-ray from earlier today. CT pulmonary angiogram October 11, 2019. FINDINGS: Cardiovascular: Mild calcified atherosclerosis in the thoracic aorta. The thoracic aorta is nonaneurysmal and there is no evidence of dissection. Cardiomegaly identified. The main pulmonary artery measures 3.1 cm. No pulmonary emboli are identified. No pulmonary emboli. Mediastinum/Nodes: Small bilateral pleural effusions are identified. No pericardial effusion. The esophagus and thyroid are normal. Mediastinal adenopathy is similar in the interval. Right greater than left hilar adenopathy is similar as well. The chest wall is normal. Lungs/Pleura: The central airways are normal. No pneumothorax. Patchy bilateral pulmonary infiltrates are identified. There are similar in character to the infiltrates seen in February of 2021. However, within the regions of ground-glass, there are some focal regions of denser consolidation. All lobes are and affected. No nodules or masses. Upper Abdomen: No acute abnormality. Musculoskeletal: No chest wall abnormality. No acute or significant  osseous findings. Review of the MIP images confirms the above findings. IMPRESSION: 1. No pulmonary emboli. 2. Diffuse bilateral pulmonary infiltrates involving all lobes. In February 2021, the infiltrates were almost completely ground-glass. Today, the infiltrates are mostly ground-glass  but do contain areas of denser or more focal consolidation. The findings are not specific. Given the history of COVID-19, the findings could represent COVID-19 pneumonia. Other atypical infections could have a similar appearance. There are other inflammatory conditions which could have a similar appearance. Pulmonary edema should also be considered. 3. Adenopathy in the mediastinum is similar since February 2021. Given the lung findings, I suspect the nodes are reactive. Recommend short-term follow-up imaging after treatment of the patient's pulmonary process to ensure the lymph nodes return to normal. 4. Mild calcified atherosclerosis in the thoracic aorta. 5. Cardiomegaly. 6. The pulmonary artery measures 3.1 cm raising the possibility of pulmonary arterial hypertension. 7. Small bilateral pleural effusions. 8. No other abnormalities. Aortic Atherosclerosis (ICD10-I70.0). Electronically Signed   By: Gerome Sam III M.D   On: 07/20/2020 16:44   DG CHEST PORT 1 VIEW  Result Date: 07/22/2020 CLINICAL DATA:  Sepsis.  COVID. EXAM: PORTABLE CHEST 1 VIEW COMPARISON:  CT chest 07/20/2020.  Chest x-ray 07/20/2020. FINDINGS: Stable cardiomegaly. No pulmonary venous congestion. Bilateral interstitial infiltrates again noted. Slight improvement in aeration from prior exam. No pleural effusion or pneumothorax. No acute bony abnormality. IMPRESSION: Bilateral interstitial infiltrates again noted. Slight improvement in aeration from prior exam. Electronically Signed   By: Maisie Fus  Register   On: 07/22/2020 06:03   DG Chest Port 1 View  Result Date: 07/20/2020 CLINICAL DATA:  Shortness of breath and fever. EXAM: PORTABLE CHEST 1 VIEW COMPARISON:  Jan 10, 2020 FINDINGS: New infiltrate is seen in the right mid lower lung. Suggested minimal opacity in left base. The heart, hila, mediastinum, lungs, and pleura are otherwise unchanged. IMPRESSION: Right mid lower lung infiltrate worrisome for pneumonia given history.  Possible minimal opacity in left base is well. Recommend short-term follow-up imaging to ensure resolution. Electronically Signed   By: Gerome Sam III M.D   On: 07/20/2020 11:23     Microbiology: Recent Results (from the past 240 hour(s))  Resp Panel by RT-PCR (Flu A&B, Covid) Nasopharyngeal Swab     Status: Abnormal   Collection Time: 07/20/20 10:43 AM   Specimen: Nasopharyngeal Swab; Nasopharyngeal(NP) swabs in vial transport medium  Result Value Ref Range Status   SARS Coronavirus 2 by RT PCR POSITIVE (A) NEGATIVE Final    Comment: RESULT CALLED TO, READ BACK BY AND VERIFIED WITH: OSBOURN,[email or web address]  BY MATTHEWS,B 11.21.21 (NOTE) SARS-CoV-2 target nucleic acids are DETECTED.  The SARS-CoV-2 RNA is generally detectable in upper respiratory specimens during the acute phase of infection. Positive results are indicative of the presence of the identified virus, but do not rule out bacterial infection or co-infection with other pathogens not detected by the test. Clinical correlation with patient history and other diagnostic information is necessary to determine patient infection status. The expected result is Negative.  Fact Sheet for Patients: BloggerCourse.com  Fact Sheet for Healthcare Providers: SeriousBroker.it  This test is not yet approved or cleared by the [place] FDA and  has been authorized for detection and/or diagnosis of SARS-CoV-2 by FDA under an Emergency Use Authorization (EUA).  This EUA will remain in effect (meaning this test ca n be used) for the duration of  the COVID-19 declaration under Section 564(b)(1) of the Act, 21 U.S.C. section 360bbb-3(b)(1), unless the authorization is  terminated or revoked sooner.     Influenza A by PCR NEGATIVE NEGATIVE Final   Influenza B by PCR NEGATIVE NEGATIVE Final    Comment: (NOTE) The Xpert Xpress SARS-CoV-2/FLU/RSV plus assay is intended as an aid in the diagnosis  of influenza from Nasopharyngeal swab specimens and should not be used as a sole basis for treatment. Nasal washings and aspirates are unacceptable for Xpert Xpress SARS-CoV-2/FLU/RSV testing.  Fact Sheet for Patients: BloggerCourse.comhttps://www.fda.gov/media/152166/download  Fact Sheet for Healthcare Providers: SeriousBroker.ithttps://www.fda.gov/media/152162/download  This test is not yet approved or cleared by the [place] FDA and has been authorized for detection and/or diagnosis of SARS-CoV-2 by FDA under an Emergency Use Authorization (EUA). This EUA will remain in effect (meaning this test can be used) for the duration of the COVID-19 declaration under Section 564(b)(1) of the Act, 21 U.S.C. section 360bbb-3(b)(1), unless the authorization is terminated or revoked.  Performed at [facility], [address] [place], [place] [address]   Blood Culture (routine x 2)     Status: None (Preliminary result)   Collection Time: 07/20/20 11:13 AM   Specimen: BLOOD  Result Value Ref Range Status   Specimen Description BLOOD BLOOD LEFT ARM  Final   Special Requests   Final    BOTTLES DRAWN AEROBIC AND ANAEROBIC Blood Culture adequate volume   Culture   Final    NO GROWTH 4 DAYS Performed at [facility], [address] [place], [place] [address]    Report Status PENDING  Incomplete  Blood Culture (routine x 2)     Status: None (Preliminary result)   Collection Time: 07/20/20  1:15 PM   Specimen: BLOOD  Result Value Ref Range Status   Specimen Description BLOOD LEFT ANTECUBITAL  Final   Special Requests   Final    BOTTLES DRAWN AEROBIC AND ANAEROBIC Blood Culture adequate volume   Culture   Final    NO GROWTH 4 DAYS Performed at [facility], [address] [place], [place] [address]    Report Status PENDING  Incomplete     Labs: Basic Metabolic Panel: Recent Labs  Lab 07/20/20 1113 07/20/20 1113 07/21/20 0513 07/21/20 0513 07/22/20 0616 07/22/20 0616 07/23/20 0808 07/24/20 0624  NA 135   --  141  --  139  --  139 136  K 4.6   < > 4.7   < > 4.3   < > 4.2 4.2  CL 102  --  106  --  107  --  109 106  CO2 25  --  23  --  23  --  20* 21*  GLUCOSE 158*  --  116*  --  134*  --  139* 163*  BUN 18  --  18  --  22*  --  19 20  CREATININE 0.78  --  0.68  --  0.59  --  0.62 0.60  CALCIUM 8.6*  --  8.1*  --  8.3*  --  8.4* 8.1*  MG  --   --  2.3  --  2.3  --  2.3 2.4  PHOS  --   --  4.1  --  3.2  --  2.6 3.4   < > = values in this interval not displayed.   Liver Function Tests: Recent Labs  Lab 07/20/20 1113 07/21/20 0513 07/22/20 0616 07/23/20 0808 07/24/20 0624  AST 22 17 11* 13* 14*  ALT 23 21 18 19 22   ALKPHOS 75 76 61 60 57  BILITOT 0.3  0.4 0.1* 0.2* 0.2*  PROT 7.3 6.9 6.4* 6.8 6.7  ALBUMIN 3.2* 3.0* 2.8* 3.1* 2.9*   No results for input(s): LIPASE, AMYLASE in the last 168 hours. No results for input(s): AMMONIA in the last 168 hours. CBC: Recent Labs  Lab 07/20/20 1113 07/21/20 0513 07/22/20 0616 07/23/20 0808 07/24/20 0624  WBC 16.5* 11.5* 14.1* 11.0* 12.2*  NEUTROABS 14.0* 9.4* 12.0* 8.9* 9.6*  HGB 15.0 14.4 13.3 13.8 13.7  HCT 45.6 47.0* 41.5 43.8 42.6  MCV 92.3 96.1 92.6 93.2 92.8  PLT 210 199 184 211 253   Cardiac Enzymes: No results for input(s): CKTOTAL, CKMB, CKMBINDEX, TROPONINI in the last 168 hours. BNP: Invalid input(s): POCBNP CBG: Recent Labs  Lab 07/23/20 0733 07/23/20 1127 07/23/20 2136 07/24/20 0738 07/24/20 1138  GLUCAP 119* 173* 275* 188* 133*    Time coordinating discharge:  36 minutes  Signed:  Catarina Hartshorn, DO [facility] Pager: [phone] 07/24/2020, 12:11 PM

## 2020-07-25 LAB — CULTURE, BLOOD (ROUTINE X 2)
Culture: NO GROWTH
Culture: NO GROWTH
Special Requests: ADEQUATE
Special Requests: ADEQUATE

## 2020-07-28 LAB — GLUCOSE, CAPILLARY
Glucose-Capillary: 151 mg/dL — ABNORMAL HIGH (ref 70–99)
Glucose-Capillary: 225 mg/dL — ABNORMAL HIGH (ref 70–99)
Glucose-Capillary: 273 mg/dL — ABNORMAL HIGH (ref 70–99)

## 2020-07-29 ENCOUNTER — Other Ambulatory Visit: Payer: Self-pay | Admitting: *Deleted

## 2020-07-29 DIAGNOSIS — F329 Major depressive disorder, single episode, unspecified: Secondary | ICD-10-CM | POA: Diagnosis not present

## 2020-07-29 DIAGNOSIS — E7800 Pure hypercholesterolemia, unspecified: Secondary | ICD-10-CM | POA: Diagnosis not present

## 2020-07-29 NOTE — Patient Outreach (Signed)
[facility]) Care Management  07/29/2020  Paula Bailey 10/20/1960 [id number]   RED ON EMMI ALERT - General Discharge Day # 1 Date: 11/27 Red Alert Reason: Unfilled prescriptions, Not able to fill day of EMMI call   Outreach attempt #1, successful.  Identity verified.  This care manager introduced self and stated purpose of call.  [facility] care management services explained.    Member report she has been doing well since her discharge.  State she has been getting as much rest as possible, remaining hydrated and eating well.  She has been able to get her prescriptions, report taking as instructed.  Follow up with PCP is scheduled for 12/8, sister will provide transportation.  She will try to get her Covid booster at that time.  Denies any lingering shortness of breath, using breathing devices to help with pneumonia recovery. Was not discharged on home O2 but has been monitoring her oxygen levels, state they have been greater than 90%.  Denies any urgent concerns, denies need for ongoing follow up.  Agrees to contact this care manager with questions.  Plan: RN CM will send member this care manager's contact information, will close case at this time.  Kemper Durie, [place], MSN [facility] Care Management  [facility] Manager [phone]

## 2020-08-07 IMAGING — CT CT ANGIO CHEST
2 of 6 series · 18 of 46 positions shown · IV contrast (Omnipaque or Isovue)
Comparison: Chest radiograph 10/11/2019

CLINICAL DATA: Shortness of breath and weakness. Positive D-dimer.

EXAM:
CT ANGIOGRAPHY CHEST WITH CONTRAST
TECHNIQUE: Multidetector CT imaging of the chest was performed using the
standard protocol during bolus administration of intravenous
contrast. Multiplanar CT image reconstructions and MIPs were
obtained to evaluate the vascular anatomy.
CONTRAST:  100mL OMNIPAQUE IOHEXOL 350 MG/ML SOLN

[Series 5: pe axial thins · axial · 0.64mm/px · z∈[+1145,+1375]mm · 15 of 252 slices shown]
[im 11/252  lung]
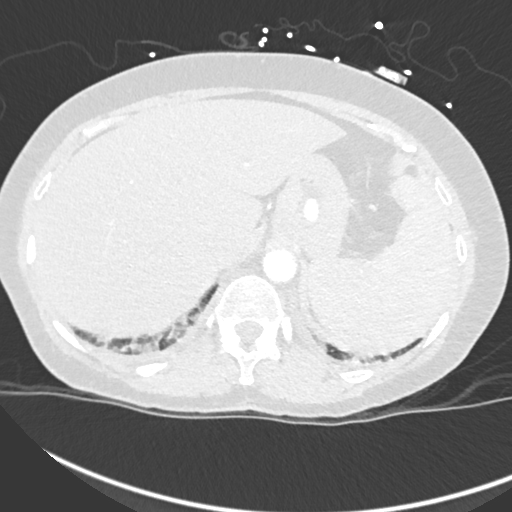
[im 33/252  soft-tissue]
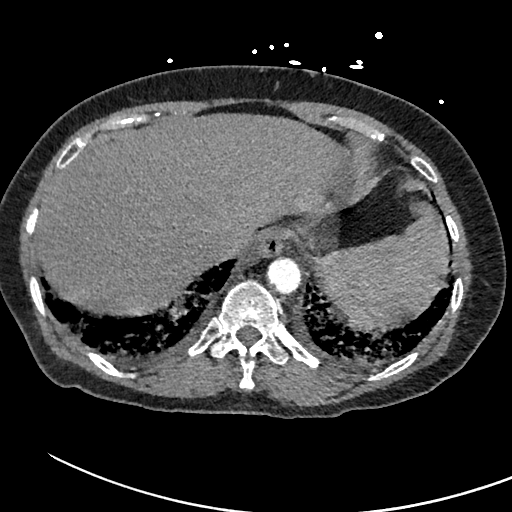
[im 44/252  lung]
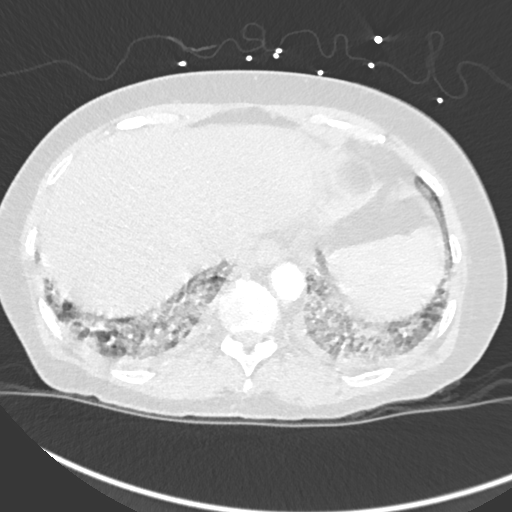
[im 66/252  soft-tissue]
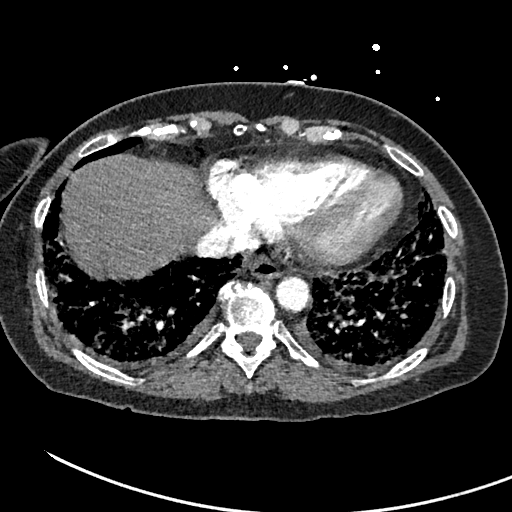
[im 77/252  lung]
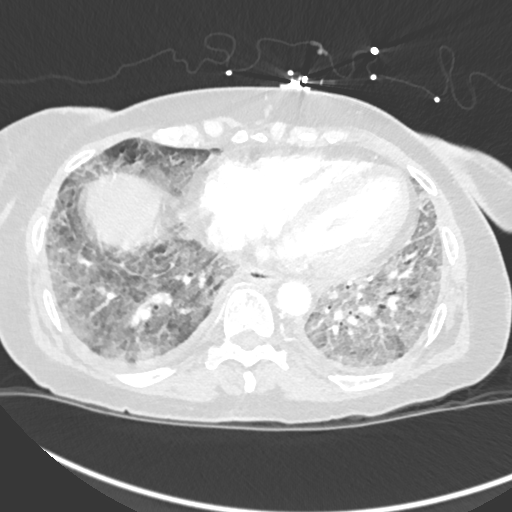
[im 99/252  soft-tissue]
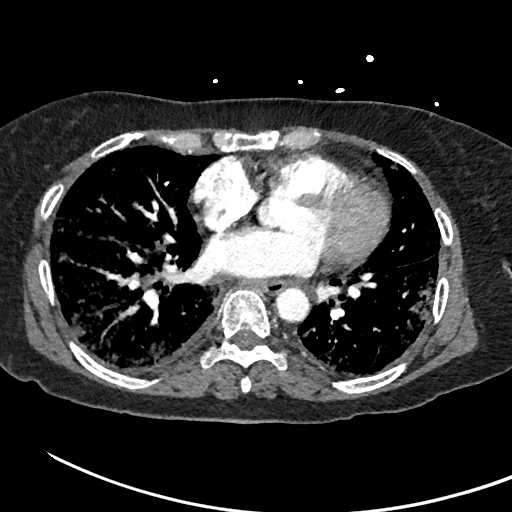
[im 110/252  lung]
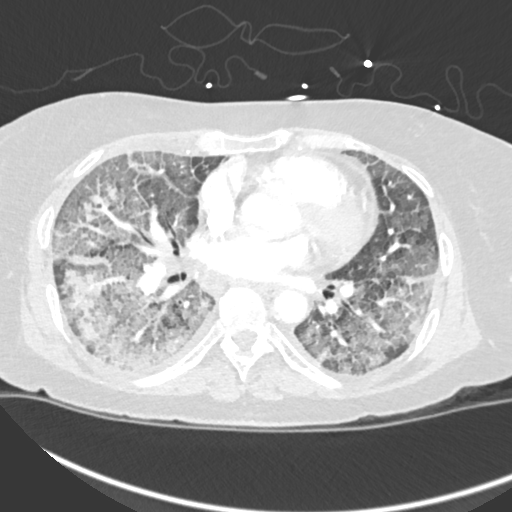
[im 131/252  soft-tissue]
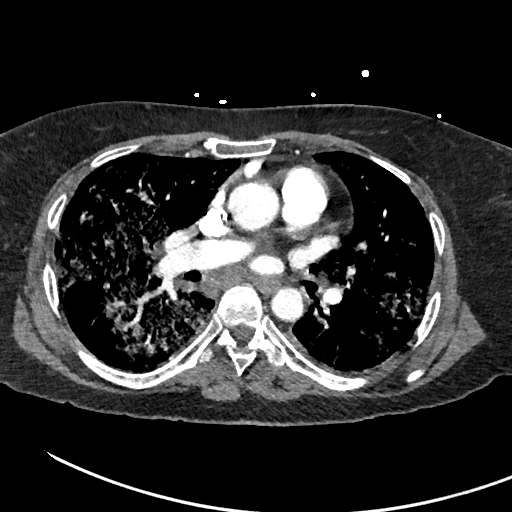
[im 142/252  lung]
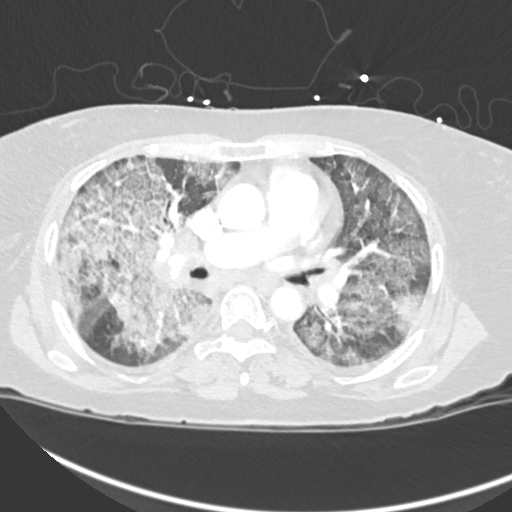
[im 153/252  soft-tissue]
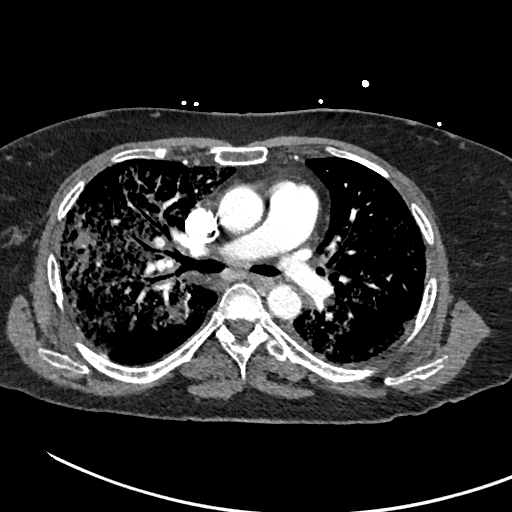
[im 175/252  lung]
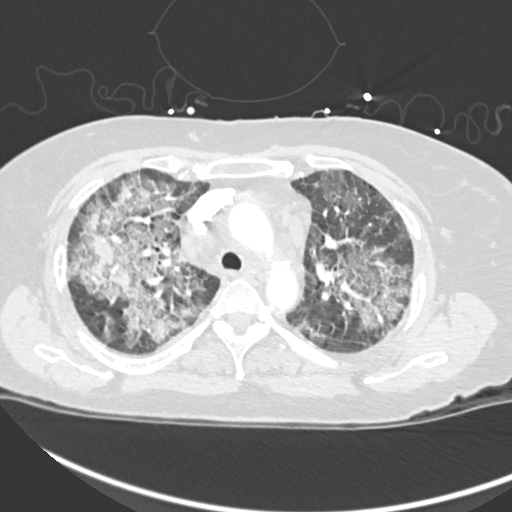
[im 186/252  soft-tissue]
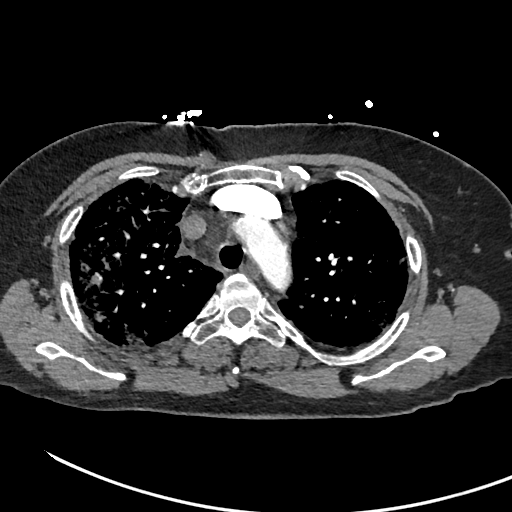
[im 208/252  lung]
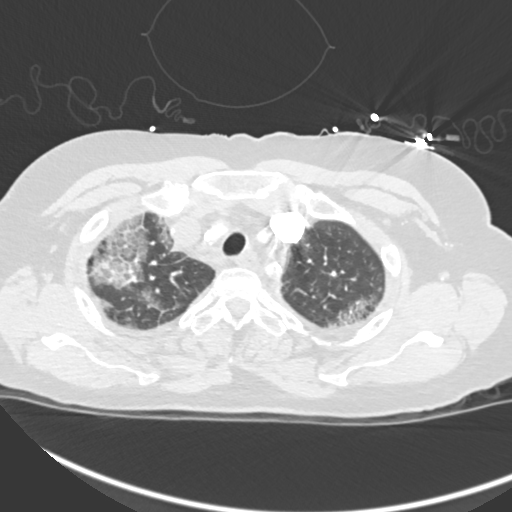
[im 219/252  soft-tissue]
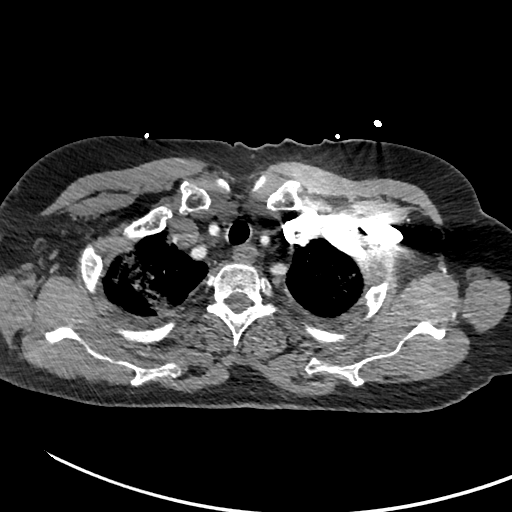
[im 241/252  lung]
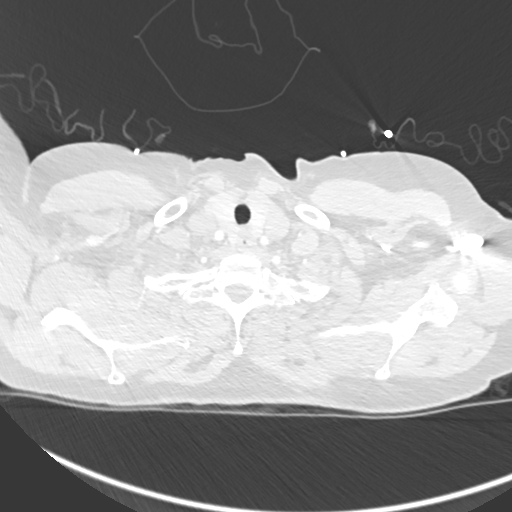

[Series 8: cor soft · coronal · 0.46mm/px · 3 of 119 slices shown]
[im 30/119  soft-tissue]
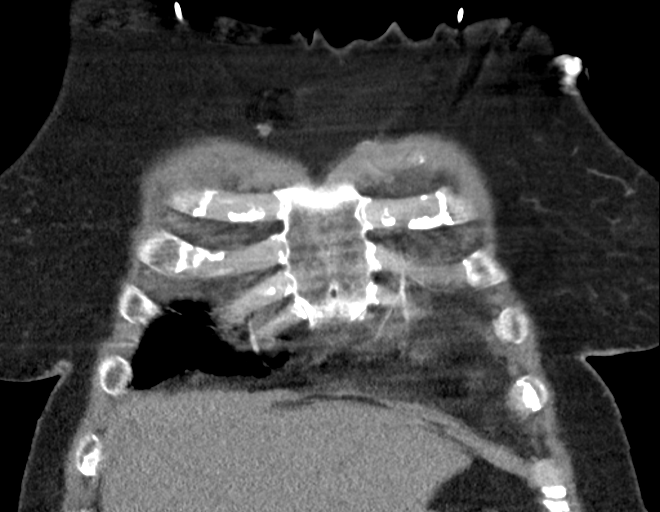
[im 60/119  soft-tissue]
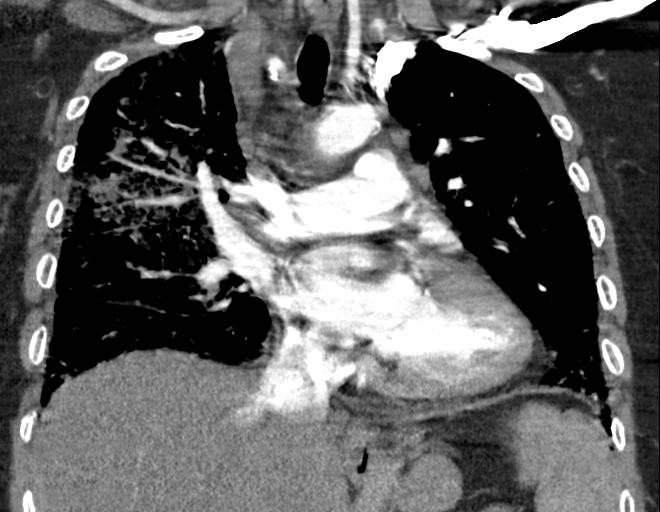
[im 89/119  soft-tissue]
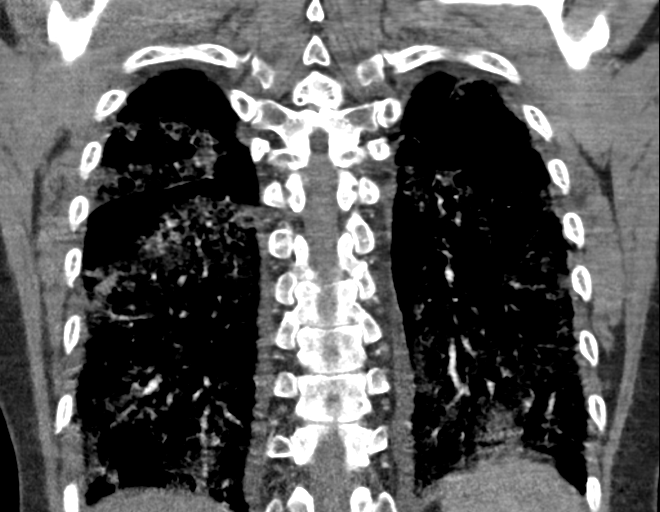

[18 of 46 positions shown; findings below may reference images not displayed]

FINDINGS: Cardiovascular: Contrast injection is sufficient to demonstrate
satisfactory opacification of the pulmonary arteries to the
segmental level. There is no pulmonary embolus or evidence of right
heart strain. The size of the main pulmonary artery is normal. Heart
size is normal, with no pericardial effusion. The course and caliber
of the aorta are normal. There is mild atherosclerotic
calcification. Opacification decreased due to pulmonary arterial
phase contrast bolus timing.

Mediastinum/Nodes: No mediastinal, hilar or axillary
lymphadenopathy. The visualized thyroid and thoracic esophageal
course are unremarkable.

Lungs/Pleura: Small bilateral pleural effusions. Diffuse
ground-glass opacities throughout both lungs.

Upper Abdomen: Contrast bolus timing is not optimized for evaluation
of the abdominal organs. The visualized portions of the organs of
the upper abdomen are normal.

Musculoskeletal: No chest wall abnormality. No bony spinal canal
stenosis.

Review of the MIP images confirms the above findings.
IMPRESSION: 1. No pulmonary embolus or acute aortic syndrome.
2. Small bilateral pleural effusions and diffuse ground-glass
opacities throughout both lungs, which could indicate infection or
pulmonary edema.

Aortic Atherosclerosis ([id number].X).

## 2020-08-12 DIAGNOSIS — Z23 Encounter for immunization: Secondary | ICD-10-CM | POA: Diagnosis not present

## 2020-08-26 ENCOUNTER — Other Ambulatory Visit: Payer: Self-pay | Admitting: Internal Medicine

## 2020-08-26 MED ORDER — BREO ELLIPTA 200-25 MCG/INH IN AEPB
1.0000 | INHALATION_SPRAY | Freq: Every day | RESPIRATORY_TRACT | 0 refills | Status: DC
Start: 2020-08-26 — End: 2020-10-14

## 2020-08-26 MED ORDER — INCRUSE ELLIPTA 62.5 MCG/INH IN AEPB
1.0000 | INHALATION_SPRAY | Freq: Every day | RESPIRATORY_TRACT | 0 refills | Status: DC
Start: 2020-08-26 — End: 2020-10-14

## 2020-09-23 DIAGNOSIS — M461 Sacroiliitis, not elsewhere classified: Secondary | ICD-10-CM | POA: Diagnosis not present

## 2020-09-23 DIAGNOSIS — M25511 Pain in right shoulder: Secondary | ICD-10-CM | POA: Diagnosis not present

## 2020-09-23 DIAGNOSIS — M545 Low back pain, unspecified: Secondary | ICD-10-CM | POA: Diagnosis not present

## 2020-09-23 DIAGNOSIS — R252 Cramp and spasm: Secondary | ICD-10-CM | POA: Diagnosis not present

## 2020-09-23 DIAGNOSIS — Z79891 Long term (current) use of opiate analgesic: Secondary | ICD-10-CM | POA: Diagnosis not present

## 2020-09-23 DIAGNOSIS — F419 Anxiety disorder, unspecified: Secondary | ICD-10-CM | POA: Diagnosis not present

## 2020-09-23 DIAGNOSIS — M542 Cervicalgia: Secondary | ICD-10-CM | POA: Diagnosis not present

## 2020-09-23 DIAGNOSIS — M5459 Other low back pain: Secondary | ICD-10-CM | POA: Diagnosis not present
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
## 2020-10-06 ENCOUNTER — Telehealth (INDEPENDENT_AMBULATORY_CARE_PROVIDER_SITE_OTHER): Payer: Medicare Other | Admitting: Psychiatry

## 2020-10-06 ENCOUNTER — Encounter: Payer: Self-pay | Admitting: Psychiatry

## 2020-10-06 ENCOUNTER — Other Ambulatory Visit: Payer: Self-pay

## 2020-10-06 ENCOUNTER — Telehealth (HOSPITAL_COMMUNITY): Payer: Medicare Other | Admitting: Psychiatry

## 2020-10-06 DIAGNOSIS — F3342 Major depressive disorder, recurrent, in full remission: Secondary | ICD-10-CM | POA: Diagnosis not present

## 2020-10-06 DIAGNOSIS — F419 Anxiety disorder, unspecified: Secondary | ICD-10-CM | POA: Diagnosis not present

## 2020-10-06 MED ORDER — SERTRALINE HCL 100 MG PO TABS: 150.0000 mg | ORAL_TABLET | Freq: Every day | ORAL | 0 refills | Status: DC

## 2020-10-06 NOTE — Patient Instructions (Signed)
1.Continuesertraline150mg  at night  2.Next appointment:5/30 at 8:40

## 2020-10-14 ENCOUNTER — Other Ambulatory Visit: Payer: Self-pay | Admitting: Internal Medicine

## 2020-12-16 DIAGNOSIS — M461 Sacroiliitis, not elsewhere classified: Secondary | ICD-10-CM | POA: Diagnosis not present

## 2020-12-16 DIAGNOSIS — Z79891 Long term (current) use of opiate analgesic: Secondary | ICD-10-CM | POA: Diagnosis not present

## 2020-12-16 DIAGNOSIS — M25511 Pain in right shoulder: Secondary | ICD-10-CM | POA: Diagnosis not present

## 2020-12-16 DIAGNOSIS — M542 Cervicalgia: Secondary | ICD-10-CM | POA: Diagnosis not present

## 2020-12-16 DIAGNOSIS — M5459 Other low back pain: Secondary | ICD-10-CM | POA: Diagnosis not present

## 2020-12-16 DIAGNOSIS — R252 Cramp and spasm: Secondary | ICD-10-CM | POA: Diagnosis not present

## 2020-12-16 DIAGNOSIS — M545 Low back pain, unspecified: Secondary | ICD-10-CM | POA: Diagnosis not present

## 2020-12-16 DIAGNOSIS — F419 Anxiety disorder, unspecified: Secondary | ICD-10-CM | POA: Diagnosis not present

## 2020-12-23 DIAGNOSIS — J189 Pneumonia, unspecified organism: Secondary | ICD-10-CM | POA: Diagnosis not present

## 2020-12-23 DIAGNOSIS — Z299 Encounter for prophylactic measures, unspecified: Secondary | ICD-10-CM | POA: Diagnosis not present

## 2020-12-23 DIAGNOSIS — F17210 Nicotine dependence, cigarettes, uncomplicated: Secondary | ICD-10-CM | POA: Diagnosis not present

## 2020-12-23 DIAGNOSIS — I517 Cardiomegaly: Secondary | ICD-10-CM | POA: Diagnosis not present

## 2020-12-23 DIAGNOSIS — J441 Chronic obstructive pulmonary disease with (acute) exacerbation: Secondary | ICD-10-CM | POA: Diagnosis not present

## 2020-12-23 DIAGNOSIS — R0989 Other specified symptoms and signs involving the circulatory and respiratory systems: Secondary | ICD-10-CM | POA: Diagnosis not present

## 2020-12-23 DIAGNOSIS — R059 Cough, unspecified: Secondary | ICD-10-CM | POA: Diagnosis not present

## 2020-12-23 DIAGNOSIS — J45909 Unspecified asthma, uncomplicated: Secondary | ICD-10-CM | POA: Diagnosis not present

## 2020-12-23 DIAGNOSIS — R0602 Shortness of breath: Secondary | ICD-10-CM | POA: Diagnosis not present

## 2021-01-13 ENCOUNTER — Other Ambulatory Visit: Payer: Self-pay | Admitting: Internal Medicine
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
## 2021-01-26 ENCOUNTER — Telehealth: Payer: Medicare Other | Admitting: Psychiatry

## 2021-01-28 ENCOUNTER — Other Ambulatory Visit: Payer: Self-pay

## 2021-01-28 ENCOUNTER — Telehealth (INDEPENDENT_AMBULATORY_CARE_PROVIDER_SITE_OTHER): Payer: Medicare Other | Admitting: Psychiatry

## 2021-01-28 ENCOUNTER — Encounter: Payer: Self-pay | Admitting: Psychiatry

## 2021-01-28 DIAGNOSIS — F419 Anxiety disorder, unspecified: Secondary | ICD-10-CM | POA: Diagnosis not present

## 2021-01-28 DIAGNOSIS — F3342 Major depressive disorder, recurrent, in full remission: Secondary | ICD-10-CM | POA: Diagnosis not present

## 2021-01-28 MED ORDER — SERTRALINE HCL 100 MG PO TABS: 150.0000 mg | ORAL_TABLET | Freq: Every day | ORAL | 0 refills | Status: DC

## 2021-01-28 NOTE — Patient Instructions (Signed)
1.Continuesertraline150mg  at night  2.Next appointment:9/1 at 8:40

## 2021-03-16 DIAGNOSIS — R252 Cramp and spasm: Secondary | ICD-10-CM | POA: Diagnosis not present

## 2021-03-16 DIAGNOSIS — M25511 Pain in right shoulder: Secondary | ICD-10-CM | POA: Diagnosis not present

## 2021-03-16 DIAGNOSIS — M5459 Other low back pain: Secondary | ICD-10-CM | POA: Diagnosis not present

## 2021-03-16 DIAGNOSIS — F419 Anxiety disorder, unspecified: Secondary | ICD-10-CM | POA: Diagnosis not present

## 2021-03-16 DIAGNOSIS — Z79891 Long term (current) use of opiate analgesic: Secondary | ICD-10-CM | POA: Diagnosis not present

## 2021-03-16 DIAGNOSIS — M545 Low back pain, unspecified: Secondary | ICD-10-CM | POA: Diagnosis not present

## 2021-03-16 DIAGNOSIS — M542 Cervicalgia: Secondary | ICD-10-CM | POA: Diagnosis not present

## 2021-03-16 DIAGNOSIS — M461 Sacroiliitis, not elsewhere classified: Secondary | ICD-10-CM | POA: Diagnosis not present

## 2021-04-13 ENCOUNTER — Other Ambulatory Visit: Payer: Self-pay | Admitting: Internal Medicine

## 2021-04-23 ENCOUNTER — Other Ambulatory Visit: Payer: Self-pay | Admitting: Internal Medicine

## 2021-04-30 ENCOUNTER — Telehealth: Payer: Medicare Other | Admitting: Psychiatry
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
## 2021-05-05 ENCOUNTER — Other Ambulatory Visit: Payer: Self-pay

## 2021-05-05 ENCOUNTER — Telehealth (INDEPENDENT_AMBULATORY_CARE_PROVIDER_SITE_OTHER): Payer: Medicare Other | Admitting: Psychiatry

## 2021-05-05 ENCOUNTER — Encounter: Payer: Self-pay | Admitting: Psychiatry

## 2021-05-05 DIAGNOSIS — F3342 Major depressive disorder, recurrent, in full remission: Secondary | ICD-10-CM

## 2021-05-05 DIAGNOSIS — F419 Anxiety disorder, unspecified: Secondary | ICD-10-CM | POA: Diagnosis not present

## 2021-05-05 MED ORDER — SERTRALINE HCL 100 MG PO TABS: 150.0000 mg | ORAL_TABLET | Freq: Every day | ORAL | 0 refills | Status: DC

## 2021-05-05 NOTE — Patient Instructions (Signed)
1. Continue sertraline 150 mg at night  2. Next appointment: 11/29 at 1:40

## 2021-06-16 DIAGNOSIS — M25511 Pain in right shoulder: Secondary | ICD-10-CM | POA: Diagnosis not present

## 2021-06-16 DIAGNOSIS — Z79891 Long term (current) use of opiate analgesic: Secondary | ICD-10-CM | POA: Diagnosis not present

## 2021-06-16 DIAGNOSIS — M542 Cervicalgia: Secondary | ICD-10-CM | POA: Diagnosis not present

## 2021-06-16 DIAGNOSIS — R252 Cramp and spasm: Secondary | ICD-10-CM | POA: Diagnosis not present

## 2021-06-16 DIAGNOSIS — M545 Low back pain, unspecified: Secondary | ICD-10-CM | POA: Diagnosis not present

## 2021-06-16 DIAGNOSIS — F419 Anxiety disorder, unspecified: Secondary | ICD-10-CM | POA: Diagnosis not present

## 2021-06-16 DIAGNOSIS — M461 Sacroiliitis, not elsewhere classified: Secondary | ICD-10-CM | POA: Diagnosis not present

## 2021-06-16 DIAGNOSIS — M5459 Other low back pain: Secondary | ICD-10-CM | POA: Diagnosis not present
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
## 2021-07-24 DIAGNOSIS — Z20828 Contact with and (suspected) exposure to other viral communicable diseases: Secondary | ICD-10-CM | POA: Diagnosis not present

## 2021-07-28 ENCOUNTER — Encounter: Payer: Self-pay | Admitting: Psychiatry

## 2021-07-28 ENCOUNTER — Other Ambulatory Visit: Payer: Self-pay

## 2021-07-28 ENCOUNTER — Telehealth (INDEPENDENT_AMBULATORY_CARE_PROVIDER_SITE_OTHER): Payer: Medicare Other | Admitting: Psychiatry

## 2021-07-28 DIAGNOSIS — F330 Major depressive disorder, recurrent, mild: Secondary | ICD-10-CM | POA: Diagnosis not present

## 2021-07-28 DIAGNOSIS — F419 Anxiety disorder, unspecified: Secondary | ICD-10-CM | POA: Diagnosis not present

## 2021-07-28 MED ORDER — SERTRALINE HCL 100 MG PO TABS: 200.0000 mg | ORAL_TABLET | Freq: Every day | ORAL | 0 refills | Status: DC

## 2021-07-28 NOTE — Patient Instructions (Signed)
Increase sertraline 200 mg at night  Next appointment: 1/9 at 2:30 The next visit will be in person visit. Please arrive 15 mins before the scheduled time.   [facility]  Address: [address], [place], [place] [address]

## 2021-07-29 ENCOUNTER — Ambulatory Visit (INDEPENDENT_AMBULATORY_CARE_PROVIDER_SITE_OTHER): Payer: Medicare Other | Admitting: Clinical

## 2021-07-29 ENCOUNTER — Other Ambulatory Visit: Payer: Self-pay

## 2021-07-29 ENCOUNTER — Encounter (HOSPITAL_COMMUNITY): Payer: Self-pay

## 2021-07-29 DIAGNOSIS — F331 Major depressive disorder, recurrent, moderate: Secondary | ICD-10-CM

## 2021-07-29 DIAGNOSIS — F419 Anxiety disorder, unspecified: Secondary | ICD-10-CM | POA: Diagnosis not present

## 2021-07-29 NOTE — Plan of Care (Signed)
Verbal Consent 

## 2021-07-29 NOTE — Progress Notes (Signed)
Virtual Visit via Telephone Note  I connected with Paula Bailey on 07/29/21 at 11:00 AM EST by telephone and verified that I am speaking with the correct person using two identifiers.  Location: Patient: Home Provider: Office   I discussed the limitations, risks, security and privacy concerns of performing an evaluation and management service by telephone and the availability of in person appointments. I also discussed with the patient that there may be a patient responsible charge related to this service. The patient expressed understanding and agreed to proceed.    Comprehensive Clinical Assessment (CCA) Note  07/29/2021 Paula Bailey [id number]  Chief Complaint: Depression and Anxiety Visit Diagnosis: Recurrent Moderate Major Depressive Disorder with Anxiety   CCA Screening, Triage and Referral (STR)  Patient Reported Information How did you hear about [place]? No data recorded Referral name: No data recorded Referral phone number: No data recorded  Whom do you see for routine medical problems? No data recorded Practice/Facility Name: No data recorded Practice/Facility Phone Number: No data recorded Name of Contact: No data recorded Contact Number: No data recorded Contact Fax Number: No data recorded Prescriber Name: No data recorded Prescriber Address (if known): No data recorded  What Is the Reason for Your Visit/Call Today? No data recorded How Long Has This Been Causing You Problems? No data recorded What Do You Feel Would Help You the Most Today? No data recorded  Have You Recently Been in Any Inpatient Treatment (Hospital/Detox/Crisis Center/28-Day Program)? No data recorded Name/Location of Program/Hospital:No data recorded How Long Were You There? No data recorded When Were You Discharged? No data recorded  Have You Ever Received Services From [facility] Before? No data recorded Who Do You See at [facility]? No data recorded  Have You Recently Had Any  Thoughts About Hurting Yourself? No data recorded Are You Planning to Commit Suicide/Harm Yourself At This time? No data recorded  Have you Recently Had Thoughts About [place]? No data recorded Explanation: No data recorded  Have You Used Any Alcohol or Drugs in the Past 24 Hours? No data recorded How Long Ago Did You Use Drugs or Alcohol? No data recorded What Did You Use and How Much? No data recorded  Do You Currently Have a Therapist/Psychiatrist? No data recorded Name of Therapist/Psychiatrist: No data recorded  Have You Been Recently Discharged From Any Office Practice or Programs? No data recorded Explanation of Discharge From Practice/Program: No data recorded    CCA Screening Triage Referral Assessment Type of Contact: No data recorded Is this Initial or Reassessment? No data recorded Date Telepsych consult ordered in [facility]:  No data recorded Time Telepsych consult ordered in [facility]:  No data recorded  Patient Reported Information Reviewed? No data recorded Patient Left Without Being Seen? No data recorded Reason for Not Completing Assessment: No data recorded  Collateral Involvement: No data recorded  Does Patient Have a Evaro? No data recorded Name and Contact of Legal Guardian: No data recorded If Minor and Not Living with Parent(s), Who has Custody? No data recorded Is CPS involved or ever been involved? No data recorded Is APS involved or ever been involved? No data recorded  Patient Determined To Be At Risk for Harm To Self or Others Based on Review of Patient Reported Information or Presenting Complaint? No data recorded Method: No data recorded Availability of Means: No data recorded Intent: No data recorded Notification Required: No data recorded Additional Information for Danger to Others Potential: No data recorded Additional  Comments for Danger to Others Potential: No data recorded Are There Guns or Other Weapons in [place]? No data recorded Types of Guns/Weapons: No data recorded Are These Weapons Safely Secured?                            No data recorded Who Could Verify You Are Able To Have These Secured: No data recorded Do You Have any Outstanding Charges, Pending Court Dates, Parole/Probation? No data recorded Contacted To Inform of Risk of Harm To Self or Others: No data recorded  Location of Assessment: No data recorded  Does Patient Present under Involuntary Commitment? No data recorded IVC Papers Initial File Date: No data recorded  [place] of Residence: No data recorded  Patient Currently Receiving the Following Services: No data recorded  Determination of Need: No data recorded  Options For Referral: No data recorded    CCA Biopsychosocial Intake/Chief Complaint:  Depression and anxiety (Patient is a 60 year old Caucasian female that presents oriented x5 (person, place, situation, time and object), alert, casually dressed, appropriately groomed, average height, average weight, depressed and cooperative)  Current Symptoms/Problems: Mood: doesn't want to do things, crying, isolates, lack of hygiene, low energy, sometimes gtting up during the night, weight gain, occasional episodes of tearfulness, some irritability,  Anxiety:  feels like people are looking at her, feels nervous and worried.   Patient Reported Schizophrenia/Schizoaffective Diagnosis in Past: No   Strengths: Can clean good, listen to music and dance, walk   Preferences: Prefers listening to music (country), prefers to walk, prefers being in doors, doesn't prefer being around a lot of people  Abilities: Good cleaner, good grandmother   Type of Services Patient Feels are Needed: Therapy, medication    Initial Clinical Notes/Concerns: Symptoms started after her divorce but increased last Oct. 2018,  symptoms occur daily, symptoms are moderate    Mental Health Symptoms Depression:   Change in energy/activity;  Irritability; Sleep (too much or little); Tearfulness; Weight gain/loss   Duration of Depressive symptoms:  Greater than two weeks   Mania:   N/A   Anxiety:    Worrying; Tension; Restlessness; Fatigue   Psychosis:   None   Duration of Psychotic symptoms: No data recorded  Trauma:   N/A   Obsessions:   N/A   Compulsions:   N/A   Inattention:   N/A   Hyperactivity/Impulsivity:   N/A   Oppositional/Defiant Behaviors:   N/A   Emotional Irregularity:   N/A   Other Mood/Personality Symptoms:   None     Mental Status Exam Appearance and self-care  Stature:   Average   Weight:   Average weight   Clothing:   Casual   Grooming:   Normal   Cosmetic use:   Age appropriate   Posture/gait:   Normal   Motor activity:   Not Remarkable   Sensorium  Attention:   Normal   Concentration:   Normal   Orientation:   X5   Recall/memory:   Normal   Affect and Mood  Affect:   Depressed; Flat   Mood:   Depressed   Relating  Eye contact:   Normal   Facial expression:   Depressed   Attitude toward examiner:   Cooperative   Thought and Language  Speech flow:  Normal   Thought content:   Appropriate to Mood and Circumstances   Preoccupation:   None (None)   Hallucinations:   None (  None)   Organization:  Landscape architect of Knowledge:   Average   Intelligence:   Average   Abstraction:   Normal   Judgement:   Normal   Reality Testing:   Adequate   Insight:   Good   Decision Making:   Normal   Social Functioning  Social Maturity:   Isolates   Social Judgement:   Normal   Stress  Stressors:   Transitions; Illness (Back pain currently seeing pain management for this.)   Coping Ability:   Overwhelmed   Skill Deficits:   None   Supports:   Friends/Service system; Family (Sister and daughter)     Religion: Religion/Spirituality Are You A Religious Person?: No How Might This Affect  Treatment?: No impact  Leisure/Recreation: Leisure / Recreation Do You Have Hobbies?: No  Exercise/Diet: Exercise/Diet Do You Exercise?: No Have You Gained or Lost A Significant Amount of Weight in the Past Six Months?: No Do You Follow a Special Diet?: No Do You Have Any Trouble Sleeping?: Yes (Takes naps during the day) Explanation of Sleeping Difficulties: Difficulty with falling asleep   CCA Employment/Education Employment/Work Situation: Employment / Work Situation Employment Situation: On disability Why is Patient on Disability: Due to physical and mental health How Long has Patient Been on Disability: 20year Patient's Job has Been Impacted by Current Illness: No What is the Longest Time Patient has Held a Job?: 5 years Where was the Patient Employed at that Time?: [employer]  Has Patient ever Been in the [employer]?: No  Education: Education Is Patient Currently Attending School?: No Last Grade Completed: 12 Name of High School: [employer]  Did You Graduate From [employer]?: Yes Did Physicist, medical?: No Did You Attend Graduate School?: No Did You Have Any Special Interests In School?: Home EC  Did You Have An Individualized Education Program (IIEP): Yes (Reading, Math, ETC) Did You Have Any Difficulty At School?: No Patient's Education Has Been Impacted by Current Illness: No   CCA Family/Childhood History Family and Relationship History: Family history Marital status: Divorced Divorced, when?: 90yrs ago What types of issues is patient dealing with in the relationship?: None Additional relationship information: No additional Are you sexually active?: No What is your sexual orientation?: Heterosexual Has your sexual activity been affected by drugs, alcohol, medication, or emotional stress?: Emotional stress  Does patient have children?: Yes How many children?: 2 How is patient's relationship with their children?: The patient notes, " I have a  good relationship with my son and daughter ".  Childhood History:  Childhood History By whom was/is the patient raised?: Mother/father and step-parent Additional childhood history information: Parents passed away when she was a young adult. Good child hood Description of patient's relationship with caregiver when they were a child: Mother: Good relationship, Father: good relationship Patient's description of current relationship with people who raised him/her: Deceased How were you disciplined when you got in trouble as a child/adolescent?: Grounded  Does patient have siblings?: Yes Number of Siblings: 2 Description of patient's current relationship with siblings: The patient notes, " I have a good relationship with my brother and sister". Did patient suffer any verbal/emotional/physical/sexual abuse as a child?: No Did patient suffer from severe childhood neglect?: No Has patient ever been sexually abused/assaulted/raped as an adolescent or adult?: No Was the patient ever a victim of a crime or a disaster?: No Witnessed domestic violence?: No Has patient been affected by domestic violence as an adult?:  No  Child/Adolescent Assessment:     CCA Substance Use Alcohol/Drug Use: Alcohol / Drug Use Pain Medications: See patient record Prescriptions: See patient record Over the Counter: None History of alcohol / drug use?: No history of alcohol / drug abuse Longest period of sobriety (when/how long): NA                         ASAM's:  Six Dimensions of Multidimensional Assessment  Dimension 1:  Acute Intoxication and/or Withdrawal Potential:      Dimension 2:  Biomedical Conditions and Complications:      Dimension 3:  Emotional, Behavioral, or Cognitive Conditions and Complications:     Dimension 4:  Readiness to Change:     Dimension 5:  Relapse, Continued use, or Continued Problem Potential:     Dimension 6:  Recovery/Living Environment:     ASAM Severity Score:     ASAM Recommended Level of Treatment:     Substance use Disorder (SUD)    Recommendations for Services/Supports/Treatments: Recommendations for Services/Supports/Treatments Recommendations For Services/Supports/Treatments: Individual Therapy, Medication Management  DSM5 Diagnoses: Patient Active Problem List   Diagnosis Date Noted   Pneumonia due to COVID-19 virus 07/20/2020   Lumbar radiculopathy 12/10/2019   Chronic pain syndrome 10/12/2019   Leukocytosis 10/11/2019   Hyponatremia 10/11/2019   Elevated brain natriuretic peptide (BNP) level 10/11/2019   Tobacco abuse 10/11/2019    Patient Centered Plan: Patient is on the following Treatment Plan(s):  Recurrent Moderate Major Depressive Disorder with Anxiety   Referrals to Alternative Service(s): Referred to Alternative Service(s):   Place:   Date:   Time:    Referred to Alternative Service(s):   Place:   Date:   Time:    Referred to Alternative Service(s):   Place:   Date:   Time:    Referred to Alternative Service(s):   Place:   Date:   Time:     I discussed the assessment and treatment plan with the patient. The patient was provided an opportunity to ask questions and all were answered. The patient agreed with the plan and demonstrated an understanding of the instructions.   The patient was advised to call back or seek an in-person evaluation if the symptoms worsen or if the condition fails to improve as anticipated.  I provided 60 minutes of non-face-to-face time during this encounter.  Winfred Burn, LCSW  07/29/2021

## 2021-08-10 ENCOUNTER — Ambulatory Visit (INDEPENDENT_AMBULATORY_CARE_PROVIDER_SITE_OTHER): Payer: Medicare Other | Admitting: Clinical

## 2021-08-10 ENCOUNTER — Other Ambulatory Visit: Payer: Self-pay

## 2021-08-10 DIAGNOSIS — F331 Major depressive disorder, recurrent, moderate: Secondary | ICD-10-CM

## 2021-08-10 DIAGNOSIS — F419 Anxiety disorder, unspecified: Secondary | ICD-10-CM

## 2021-08-10 NOTE — Progress Notes (Signed)
Virtual Visit via Telephone Note  I connected with Paula Bailey on 08/10/21 at 11:00 AM EST by telephone and verified that I am speaking with the correct person using two identifiers.  Location: Patient: Home Provider: Office   I discussed the limitations, risks, security and privacy concerns of performing an evaluation and management service by telephone and the availability of in person appointments. I also discussed with the patient that there may be a patient responsible charge related to this service. The patient expressed understanding and agreed to proceed.  THERAPIST PROGRESS NOTE   Session Time: 11:00 AM-11:30 AM   Participation Level: Active   Behavioral Response: CasualAlertDepressed   Type of Therapy: Individual Therapy   Treatment Goals addressed: Coping   Interventions: CBT, DBT, Solution Focused and Supportive   Summary: Paula Bailey is a 60 y.o. female who presents with Depression and Anxiety.  The OPT therapist worked with the patient for her initial OPT treatment. The OPT therapist utilized Motivational Interviewing to assist in creating therapeutic repore. The patient in the session was engaged and work in collaboration giving feedback about her triggers and symptoms over the past few weeks. The patient spoke about her recent change increasing her Zoloft via advice of her prescriber. The OPT therapist utilized Cognitive Behavioral Therapy through cognitive restructuring as well as worked with the patient reviewing coping strategies to assist in management of  depression and anxiety as well as strategies to build energy and functioning. The patient spoke about family plans for the upcoming holidays.     Suicidal/Homicidal: Nowithout intent/plan   Therapist Response: The OPT therapist worked with the patient for the patients  scheduled session. The patient was engaged in her session and gave feedback in relation to triggers, symptoms, and behavior responses over the  past few weeks. The OPT therapist worked with the patient utilizing an in session Cognitive Behavioral Therapy exercise. The patient was responsive in the session and verbalized, " I realize that while I really want the medicine to work I have to do my part in trying to be more active ". The OPT therapist will continue treatment work with the patient in her next scheduled session.   Plan: Return again in 2 weeks.   Diagnosis:      Axis I: Recurrent Depression Moderate, with Anxiety                           Axis II: No diagnosis   I discussed the assessment and treatment plan with the patient. The patient was provided an opportunity to ask questions and all were answered. The patient agreed with the plan and demonstrated an understanding of the instructions.   The patient was advised to call back or seek an in-person evaluation if the symptoms worsen or if the condition fails to improve as anticipated.   I provided 30 minutes of non-face-to-face time during this encounter   Winfred Burn, [place]   08/10/2021

## 2021-08-13 DIAGNOSIS — Z20822 Contact with and (suspected) exposure to covid-19: Secondary | ICD-10-CM | POA: Diagnosis not present

## 2021-08-13 DIAGNOSIS — I959 Hypotension, unspecified: Secondary | ICD-10-CM | POA: Diagnosis not present

## 2021-08-13 DIAGNOSIS — I7090 Unspecified atherosclerosis: Secondary | ICD-10-CM | POA: Diagnosis not present

## 2021-08-13 DIAGNOSIS — R0902 Hypoxemia: Secondary | ICD-10-CM | POA: Diagnosis not present

## 2021-08-13 DIAGNOSIS — J80 Acute respiratory distress syndrome: Secondary | ICD-10-CM | POA: Diagnosis not present

## 2021-08-13 DIAGNOSIS — Z885 Allergy status to narcotic agent status: Secondary | ICD-10-CM | POA: Diagnosis not present

## 2021-08-13 DIAGNOSIS — F339 Major depressive disorder, recurrent, unspecified: Secondary | ICD-10-CM | POA: Diagnosis not present

## 2021-08-13 DIAGNOSIS — J189 Pneumonia, unspecified organism: Secondary | ICD-10-CM | POA: Diagnosis not present

## 2021-08-13 DIAGNOSIS — G894 Chronic pain syndrome: Secondary | ICD-10-CM | POA: Diagnosis not present

## 2021-08-13 DIAGNOSIS — R918 Other nonspecific abnormal finding of lung field: Secondary | ICD-10-CM | POA: Diagnosis not present

## 2021-08-13 DIAGNOSIS — Z79899 Other long term (current) drug therapy: Secondary | ICD-10-CM | POA: Diagnosis not present

## 2021-08-13 DIAGNOSIS — R531 Weakness: Secondary | ICD-10-CM | POA: Diagnosis not present

## 2021-08-13 DIAGNOSIS — E876 Hypokalemia: Secondary | ICD-10-CM | POA: Diagnosis not present

## 2021-08-13 DIAGNOSIS — J9601 Acute respiratory failure with hypoxia: Secondary | ICD-10-CM | POA: Diagnosis not present

## 2021-08-13 DIAGNOSIS — Z90710 Acquired absence of both cervix and uterus: Secondary | ICD-10-CM | POA: Diagnosis not present

## 2021-08-13 DIAGNOSIS — D72829 Elevated white blood cell count, unspecified: Secondary | ICD-10-CM | POA: Diagnosis not present

## 2021-08-13 DIAGNOSIS — R000 Tachycardia, unspecified: Secondary | ICD-10-CM | POA: Diagnosis not present

## 2021-08-13 DIAGNOSIS — R059 Cough, unspecified: Secondary | ICD-10-CM | POA: Diagnosis not present

## 2021-08-13 DIAGNOSIS — R0689 Other abnormalities of breathing: Secondary | ICD-10-CM | POA: Diagnosis not present

## 2021-08-13 DIAGNOSIS — Z635 Disruption of family by separation and divorce: Secondary | ICD-10-CM | POA: Diagnosis not present

## 2021-08-13 DIAGNOSIS — F32A Depression, unspecified: Secondary | ICD-10-CM | POA: Diagnosis not present

## 2021-08-13 DIAGNOSIS — J440 Chronic obstructive pulmonary disease with acute lower respiratory infection: Secondary | ICD-10-CM | POA: Diagnosis not present

## 2021-08-13 DIAGNOSIS — I700 Atherosclerosis of aorta: Secondary | ICD-10-CM | POA: Diagnosis not present

## 2021-08-13 DIAGNOSIS — J849 Interstitial pulmonary disease, unspecified: Secondary | ICD-10-CM | POA: Diagnosis not present

## 2021-08-13 DIAGNOSIS — J449 Chronic obstructive pulmonary disease, unspecified: Secondary | ICD-10-CM | POA: Diagnosis not present

## 2021-08-13 DIAGNOSIS — I517 Cardiomegaly: Secondary | ICD-10-CM | POA: Diagnosis not present

## 2021-08-24 DIAGNOSIS — Z20828 Contact with and (suspected) exposure to other viral communicable diseases: Secondary | ICD-10-CM | POA: Diagnosis not present

## 2021-08-25 DIAGNOSIS — J189 Pneumonia, unspecified organism: Secondary | ICD-10-CM | POA: Diagnosis not present

## 2021-08-25 DIAGNOSIS — F17210 Nicotine dependence, cigarettes, uncomplicated: Secondary | ICD-10-CM | POA: Diagnosis not present

## 2021-08-25 DIAGNOSIS — J9611 Chronic respiratory failure with hypoxia: Secondary | ICD-10-CM | POA: Diagnosis not present

## 2021-08-25 DIAGNOSIS — I700 Atherosclerosis of aorta: Secondary | ICD-10-CM | POA: Diagnosis not present

## 2021-08-25 DIAGNOSIS — Z09 Encounter for follow-up examination after completed treatment for conditions other than malignant neoplasm: Secondary | ICD-10-CM | POA: Diagnosis not present
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
## 2021-09-07 ENCOUNTER — Ambulatory Visit (INDEPENDENT_AMBULATORY_CARE_PROVIDER_SITE_OTHER): Payer: Medicare Other | Admitting: Psychiatry

## 2021-09-07 ENCOUNTER — Encounter: Payer: Self-pay | Admitting: Psychiatry

## 2021-09-07 DIAGNOSIS — F419 Anxiety disorder, unspecified: Secondary | ICD-10-CM

## 2021-09-07 DIAGNOSIS — F331 Major depressive disorder, recurrent, moderate: Secondary | ICD-10-CM | POA: Diagnosis not present

## 2021-09-07 NOTE — Patient Instructions (Signed)
Continue sertraline 200 mg at night  Next appointment: 2/20 at 11 AM, in person  The next visit will be in person visit. Please arrive 15 mins before the scheduled time.   [facility]  Address: [address], [place], [place] [address]

## 2021-09-08 ENCOUNTER — Other Ambulatory Visit: Payer: Self-pay

## 2021-09-08 ENCOUNTER — Ambulatory Visit (INDEPENDENT_AMBULATORY_CARE_PROVIDER_SITE_OTHER): Payer: Medicare Other | Admitting: Clinical

## 2021-09-08 DIAGNOSIS — F331 Major depressive disorder, recurrent, moderate: Secondary | ICD-10-CM

## 2021-09-08 DIAGNOSIS — Z1339 Encounter for screening examination for other mental health and behavioral disorders: Secondary | ICD-10-CM | POA: Diagnosis not present

## 2021-09-08 DIAGNOSIS — F419 Anxiety disorder, unspecified: Secondary | ICD-10-CM

## 2021-09-08 DIAGNOSIS — Z1331 Encounter for screening for depression: Secondary | ICD-10-CM | POA: Diagnosis not present

## 2021-09-08 DIAGNOSIS — E7800 Pure hypercholesterolemia, unspecified: Secondary | ICD-10-CM | POA: Diagnosis not present

## 2021-09-08 DIAGNOSIS — R5383 Other fatigue: Secondary | ICD-10-CM | POA: Diagnosis not present

## 2021-09-08 DIAGNOSIS — Z299 Encounter for prophylactic measures, unspecified: Secondary | ICD-10-CM | POA: Diagnosis not present

## 2021-09-08 DIAGNOSIS — Z0000 Encounter for general adult medical examination without abnormal findings: Secondary | ICD-10-CM | POA: Diagnosis not present

## 2021-09-08 DIAGNOSIS — Z7189 Other specified counseling: Secondary | ICD-10-CM | POA: Diagnosis not present

## 2021-09-08 DIAGNOSIS — F17210 Nicotine dependence, cigarettes, uncomplicated: Secondary | ICD-10-CM | POA: Diagnosis not present

## 2021-09-08 DIAGNOSIS — Z6826 Body mass index (BMI) 26.0-26.9, adult: Secondary | ICD-10-CM | POA: Diagnosis not present

## 2021-09-08 DIAGNOSIS — Z79899 Other long term (current) drug therapy: Secondary | ICD-10-CM | POA: Diagnosis not present

## 2021-09-08 NOTE — Progress Notes (Signed)
Virtual Visit via Telephone Note   I connected with Annell Canty on 09/08/21 at 11:00 AM EST by telephone and verified that I am speaking with the correct person using two identifiers.   Location: Patient: Home Provider: Office   I discussed the limitations, risks, security and privacy concerns of performing an evaluation and management service by telephone and the availability of in person appointments. I also discussed with the patient that there may be a patient responsible charge related to this service. The patient expressed understanding and agreed to proceed.   THERAPIST PROGRESS NOTE   Session Time: 11:00 AM-11:30 AM   Participation Level: Active   Behavioral Response: CasualAlertDepressed   Type of Therapy: Individual Therapy   Treatment Goals addressed: Coping   Interventions: CBT, DBT, Solution Focused and Supportive   Summary: Poet Hineman is a 61 y.o. female who presents with Depression and Anxiety.  The OPT therapist worked with the patient for her initial OPT treatment. The OPT therapist utilized Motivational Interviewing to assist in creating therapeutic repore. The patient in the session was engaged and work in collaboration giving feedback about her triggers and symptoms over the past few weeks. The patient spoke about being hospitalized with Pneumonia. The OPT therapist utilized Cognitive Behavioral Therapy through cognitive restructuring as well as worked with the patient reviewing coping strategies to assist in management of  depression and anxiety as well as strategies to build energy and functioning.     Suicidal/Homicidal: Nowithout intent/plan   Therapist Response: The OPT therapist worked with the patient for the patients  scheduled session. The patient was engaged in her session and gave feedback in relation to triggers, symptoms, and behavior responses over the past few weeks. The OPT therapist worked with the patient utilizing an in session Cognitive  Behavioral Therapy exercise. The patient was responsive in the session and verbalized, " I realize that I have to give my body time to recover from getting sick ". The OPT therapist worked with the patient on her basic needs eating, sleeping, hygiene, and physical exercise. The OPT therapist will continue treatment work with the patient in her next scheduled session.   Plan: Return again in 2 weeks.   Diagnosis:      Axis I: Recurrent Depression Moderate, with Anxiety                           Axis II: No diagnosis   I discussed the assessment and treatment plan with the patient. The patient was provided an opportunity to ask questions and all were answered. The patient agreed with the plan and demonstrated an understanding of the instructions.   The patient was advised to call back or seek an in-person evaluation if the symptoms worsen or if the condition fails to improve as anticipated.   I provided 30 minutes of non-face-to-face time during this encounter   Winfred Burn, [place]   09/08/2021

## 2021-09-16 DIAGNOSIS — M5459 Other low back pain: Secondary | ICD-10-CM | POA: Diagnosis not present

## 2021-09-16 DIAGNOSIS — M25511 Pain in right shoulder: Secondary | ICD-10-CM | POA: Diagnosis not present

## 2021-09-16 DIAGNOSIS — Z79891 Long term (current) use of opiate analgesic: Secondary | ICD-10-CM | POA: Diagnosis not present

## 2021-09-16 DIAGNOSIS — R252 Cramp and spasm: Secondary | ICD-10-CM | POA: Diagnosis not present

## 2021-09-16 DIAGNOSIS — M461 Sacroiliitis, not elsewhere classified: Secondary | ICD-10-CM | POA: Diagnosis not present

## 2021-09-16 DIAGNOSIS — M542 Cervicalgia: Secondary | ICD-10-CM | POA: Diagnosis not present

## 2021-09-29 ENCOUNTER — Ambulatory Visit (INDEPENDENT_AMBULATORY_CARE_PROVIDER_SITE_OTHER): Payer: Medicare Other | Admitting: Clinical

## 2021-09-29 ENCOUNTER — Other Ambulatory Visit: Payer: Self-pay

## 2021-09-29 DIAGNOSIS — F419 Anxiety disorder, unspecified: Secondary | ICD-10-CM | POA: Diagnosis not present

## 2021-09-29 DIAGNOSIS — F331 Major depressive disorder, recurrent, moderate: Secondary | ICD-10-CM

## 2021-09-29 NOTE — Progress Notes (Signed)
Virtual Visit via Telephone Note   I connected with Paula Bailey on 09/29/21 at 11:00 AM EST by telephone and verified that I am speaking with the correct person using two identifiers.   Location: Patient: Home Provider: Office   I discussed the limitations, risks, security and privacy concerns of performing an evaluation and management service by telephone and the availability of in person appointments. I also discussed with the patient that there may be a patient responsible charge related to this service. The patient expressed understanding and agreed to proceed.   THERAPIST PROGRESS NOTE   Session Time: 11:00 AM-11:30 AM   Participation Level: Active   Behavioral Response: CasualAlertDepressed   Type of Therapy: Individual Therapy   Treatment Goals addressed: Coping   Interventions: CBT, DBT, Solution Focused and Supportive   Summary: Paula Bailey is a 61 y.o. female who presents with Depression and Anxiety.  The OPT therapist worked with the patient for her ongoing OPT treatment. The OPT therapist utilized Motivational Interviewing to assist in creating therapeutic repore. The patient in the session was engaged and work in collaboration giving feedback about her triggers and symptoms over the past few weeks. The patient spoke about not feeling confident her medication for mood is working and planning to request a change in her next med appointment. The OPT therapist utilized Cognitive Behavioral Therapy through cognitive restructuring as well as worked with the patient reviewing coping strategies to assist in management of  depression and anxiety as well as strategies to build energy and functioning.  (The OPT therapist reiterated to not make changes or stop her current medication without the direct authorization of her prescriber).   Suicidal/Homicidal: Nowithout intent/plan   Therapist Response: The OPT therapist worked with the patient for the patients  scheduled session. The  patient was engaged in her session and gave feedback in relation to triggers, symptoms, and behavior responses over the past few weeks. The OPT therapist worked with the patient utilizing an in session Cognitive Behavioral Therapy exercise. The patient was responsive in the session and verbalized, " I have been doing really well at doing my part with self care and exercising I just dont think my medicine is working ". The OPT therapist worked with the patient on her basic needs eating, sleeping, hygiene, and physical exercise and instructed the patient to continue her self care . The OPT therapist will continue treatment work with the patient in her next scheduled session.   Plan: Return again in 2 weeks.   Diagnosis:      Axis I: Recurrent Depression Moderate, with Anxiety                           Axis II: No diagnosis   I discussed the assessment and treatment plan with the patient. The patient was provided an opportunity to ask questions and all were answered. The patient agreed with the plan and demonstrated an understanding of the instructions.   The patient was advised to call back or seek an in-person evaluation if the symptoms worsen or if the condition fails to improve as anticipated.   I provided 30 minutes of non-face-to-face time during this encounter   Winfred Burn, [place]   09/29/2021

## 2021-10-14 DIAGNOSIS — M5459 Other low back pain: Secondary | ICD-10-CM | POA: Diagnosis not present

## 2021-10-14 DIAGNOSIS — Z79891 Long term (current) use of opiate analgesic: Secondary | ICD-10-CM | POA: Diagnosis not present

## 2021-10-14 NOTE — Progress Notes (Signed)
BH MD/PA/NP OP Progress Note  10/19/2021 11:36 AM Paula Bailey  MRN:  [id number]  Chief Complaint:  Chief Complaint  Patient presents with   Follow-up   Depression   HPI:  This is a follow-up appointment for depression.  She states that she continues to stay depressed.  She does not want to do anything.  She smokes, drinks or coffee, and does not do anything otherwise.  She bathes only occasionally.  She has not noticed any difference since up titration of sertraline.  She has depressive symptoms as in PHQ-9.  She denies SI.  She likes her therapist, Mr. Montez Morita; she is willing to continue to see him.  She agrees to trying to take a walk every day. (Of note, the instruction was given repeatedly, and she verbalized understanding of this.)  Her sister presents to the interview.  She acknowledges that Paula Bailey is not doing anything, and is depressed.  Both of them are not aware of any recent stressors.  She agrees to take a walk with Paula Bailey  every day.     Wt Readings from Last 3 Encounters:  10/19/21 145 lb 6.4 oz (66 kg)  07/20/20 143 lb (64.9 kg)  01/24/20 145 lb (65.8 kg)    Visit Diagnosis:    ICD-10-CM   1. Recurrent moderate major depressive disorder with anxiety (HCC)  F33.1    F41.9       Past Psychiatric History: Please see initial evaluation for full details. I have reviewed the history. No updates at this time.     Past Medical History:  Past Medical History:  Diagnosis Date   ARDS (adult respiratory distress syndrome) (HCC)    Dyslipidemia     Past Surgical History:  Procedure Laterality Date   ABDOMINAL HYSTERECTOMY     CHOLECYSTECTOMY     TONSILLECTOMY      Family Psychiatric History: Please see initial evaluation for full details. I have reviewed the history. No updates at this time.     Family History:  Family History  Problem Relation Age of Onset   CAD Father    CAD Brother     Social History:  Social History   Socioeconomic History   Marital  status: Divorced    Spouse name: Not on file   Number of children: Not on file   Years of education: Not on file   Highest education level: Not on file  Occupational History   Not on file  Tobacco Use   Smoking status: Every Day    Packs/day: 0.50    Years: 40.00    Pack years: 20.00    Types: Cigarettes    Start date: 11/13/1981   Smokeless tobacco: Never  Vaping Use   Vaping Use: Never used  Substance and Sexual Activity   Alcohol use: No   Drug use: Not Currently   Sexual activity: Not Currently  Other Topics Concern   Not on file  Social History Narrative   Not on file   Social Determinants of Health   Financial Resource Strain: Not on file  Food Insecurity: Not on file  Transportation Needs: Not on file  Physical Activity: Not on file  Stress: Not on file  Social Connections: Not on file    Allergies:  Allergies  Allergen Reactions   Hydrocodone Rash    Metabolic Disorder Labs: No results found for: HGBA1C, MPG No results found for: PROLACTIN Lab Results  Component Value Date   TRIG 155 (H) 07/20/2020   No  results found for: TSH  Therapeutic Level Labs: No results found for: LITHIUM No results found for: VALPROATE No components found for:  CBMZ  Current Medications: Current Outpatient Medications  Medication Sig Dispense Refill   alendronate (FOSAMAX) 70 MG tablet Take 70 mg by mouth once a week.  6   ALPRAZolam (XANAX) 0.5 MG tablet Take 0.25 mg by mouth at bedtime.   0   amitriptyline (ELAVIL) 10 MG tablet Take 20 mg by mouth at bedtime.     gabapentin (NEURONTIN) 300 MG capsule Take 600 mg by mouth 3 (three) times daily.   2   INCRUSE ELLIPTA 62.5 MCG/INH AEPB INHALE ONE PUFF EVERY DAY 30 each 2   methocarbamol (ROBAXIN) 500 MG tablet Take 500 mg by mouth 3 (three) times daily as needed for muscle spasms.     oxyCODONE-acetaminophen (PERCOCET/ROXICET) 5-325 MG tablet SMARTSIG:1 Tablet(s) By Mouth Every 12 Hours     sertraline (ZOLOFT) 100 MG  tablet Take 2 tablets (200 mg total) by mouth daily. 180 tablet 0   simvastatin (ZOCOR) 10 MG tablet Take 10 mg by mouth at bedtime.     VENTOLIN HFA 108 (90 Base) MCG/ACT inhaler Inhale 1-2 puffs into the lungs every 4 (four) hours as needed for wheezing or shortness of breath.   12   No current facility-administered medications for this visit.     Musculoskeletal: Strength & Muscle Tone: within normal limits Gait & Station: normal Patient leans: N/A  Psychiatric Specialty Exam: Review of Systems  Psychiatric/Behavioral:  Positive for dysphoric mood and sleep disturbance. Negative for agitation, behavioral problems, confusion, decreased concentration, hallucinations, self-injury and suicidal ideas. The patient is nervous/anxious. The patient is not hyperactive.   All other systems reviewed and are negative.  Blood pressure 126/74, pulse 84, temperature 98.2 F (36.8 C), temperature source Temporal, weight 145 lb 6.4 oz (66 kg).Body mass index is 24.2 kg/m.  General Appearance: Fairly Groomed  Eye Contact:  Good  Speech:  Clear and Coherent  Volume:  Normal  Mood:  Depressed  Affect:  Appropriate, Congruent, and down  Thought Process:  Coherent, concrete  Orientation:  Full (Time, Place, and Person)  Thought Content: Logical   Suicidal Thoughts:  No  Homicidal Thoughts:  No  Memory:  Immediate;   Good  Judgement:  Good  Insight:  Present  Psychomotor Activity:  Normal  Concentration:  Concentration: Good and Attention Span: Good  Recall:  Good  Fund of Knowledge: Good  Language: Good  Akathisia:  No  Handed:  Right  AIMS (if indicated): not done  Assets:  Communication Skills Desire for Improvement  ADL's:  Intact  Cognition: WNL  Sleep:  Fair   Screenings: GAD-7    Advertising copywriter from [facility]29/[facility]  Total GAD-7 Score 11      PHQ2-9    Flowsheet Row Office Visit from 10/19/2021 in [facility] Office Visit from 09/07/2021 in [facility] Counselor from 07/29/2021 in [facility] Video Visit from 05/05/2021 in [facility] Video Visit from 01/28/2021 in [facility]  PHQ-2 Total Score 4 2 4  0 0  PHQ-9 Total Score 11 4 7  -- --      Flowsheet Row Office Visit from 10/19/2021 in [facility] Counselor from 07/29/2021 in [facility] Video Visit from 05/05/2021 in [facility]  C-SSRS RISK CATEGORY No Risk No Risk  No Risk        Assessment and Plan:  Melodi Happel is a 61 y.o. year old female with a history of depression, r/o intellectual disability, who presents for follow up appointment for below.   1. Recurrent moderate major depressive disorder with anxiety (HCC) She reports worsening in depressive symptoms with prominent fatigue since the last visit despite up titration of sertraline without significant triggers.  Will lower the dose of sertraline given its limited benefit.  Will add bupropion adjunctive treatment for depression.  She has no known history of seizure.  Discussed potential risk of headache and worsening in anxiety, insomnia.  Coached behavioral activation.  She will continue to see her therapist.    Plan Decrease sertraline 150 mg daily Start bupropion 150 mg daily  Next appointment :3/28 at 1:20 for 20 mins, video  - TSH reportedly normal per patient (not known when it was checked the last time) (She has been on amitriptyline 20 mg qhs, oxycodone,  Xanax 0.5 mg daily as needed, and oxycodone for anxiety by other provider)   Past trials of medication: Paxil, duloxetine (drowsiness), bupropion, Xanax   The patient demonstrates the following risk factors for suicide: Chronic risk factors for suicide include: psychiatric  disorder of depression. Acute risk factors for suicide include: unemployment. Protective factors for this patient include: positive social support, coping skills and hope for the future. Considering these factors, the overall suicide risk at this point appears to be low      Collaboration of Care: Collaboration of Care: Other sees a therapist  Consent: Patient/Guardian gives verbal consent for treatment and assignment of benefits for services provided during this visit. Patient/Guardian expressed understanding and agreed to proceed.    [name], MD 10/19/2021, 11:36 AM

## 2021-10-19 ENCOUNTER — Ambulatory Visit (INDEPENDENT_AMBULATORY_CARE_PROVIDER_SITE_OTHER): Payer: Medicare Other | Admitting: Psychiatry

## 2021-10-19 ENCOUNTER — Other Ambulatory Visit: Payer: Self-pay

## 2021-10-19 ENCOUNTER — Encounter: Payer: Self-pay | Admitting: Psychiatry

## 2021-10-19 VITALS — BP 126/74 | HR 84 | Temp 98.2°F | Wt 145.4 lb

## 2021-10-19 DIAGNOSIS — F419 Anxiety disorder, unspecified: Secondary | ICD-10-CM | POA: Diagnosis not present

## 2021-10-19 DIAGNOSIS — F331 Major depressive disorder, recurrent, moderate: Secondary | ICD-10-CM

## 2021-10-19 NOTE — Patient Instructions (Signed)
Decrease sertraline 150 mg daily Start bupropion 150 mg daily  Next appointment :3/28 at 1:20

## 2021-10-20 ENCOUNTER — Ambulatory Visit (INDEPENDENT_AMBULATORY_CARE_PROVIDER_SITE_OTHER): Payer: Medicare Other | Admitting: Clinical

## 2021-10-20 DIAGNOSIS — F419 Anxiety disorder, unspecified: Secondary | ICD-10-CM

## 2021-10-20 DIAGNOSIS — F331 Major depressive disorder, recurrent, moderate: Secondary | ICD-10-CM | POA: Diagnosis not present

## 2021-10-20 NOTE — Progress Notes (Signed)
Virtual Visit via Telephone Note   I connected with Paula Bailey on 10/20/21 at 11:00 AM EST by telephone and verified that I am speaking with the correct person using two identifiers.   Location: Patient: Home Provider: Office   I discussed the limitations, risks, security and privacy concerns of performing an evaluation and management service by telephone and the availability of in person appointments. I also discussed with the patient that there may be a patient responsible charge related to this service. The patient expressed understanding and agreed to proceed.   THERAPIST PROGRESS NOTE   Session Time: 11:00 AM-11:30 AM   Participation Level: Active   Behavioral Response: CasualAlertDepressed   Type of Therapy: Individual Therapy   Treatment Goals addressed: Coping   Interventions: CBT, DBT, Solution Focused and Supportive   Summary: Paula Bailey is a 61 y.o. female who presents with Depression and Anxiety.  The OPT therapist worked with the patient for her ongoing OPT treatment. The OPT therapist utilized Motivational Interviewing to assist in creating therapeutic repore. The patient in the session was engaged and work in collaboration giving feedback about her triggers and symptoms over the past few weeks. The patient spoke about a change in her Medication Therapy program with medication Wellbutrin being added. The OPT therapist utilized Cognitive Behavioral Therapy through cognitive restructuring as well as worked with the patient reviewing coping strategies to assist in management of  depression and anxiety as well as strategies to build energy and functioning through improving her diet, exercise, and sleep.     Suicidal/Homicidal: Nowithout intent/plan   Therapist Response: The OPT therapist worked with the patient for the patients  scheduled session. The patient was engaged in her session and gave feedback in relation to triggers, symptoms, and behavior responses over the past  few weeks. The OPT therapist worked with the patient utilizing an in session Cognitive Behavioral Therapy exercise. The patient was responsive in the session and verbalized, " I have continued to do my part with self care and exercising ". The OPT therapist worked with the patient on her basic needs eating, sleeping, hygiene, and physical exercise and instructed the patient to continue her self care . The OPT therapist will continue treatment work with the patient in her next scheduled session.   Plan: Return again in 2 weeks.   Diagnosis:      Axis I: Recurrent Depression Moderate, with Anxiety                           Axis II: No diagnosis   Collaboration of Care:  Additional collaboration for this session connected with psychiatrist Dr. Vanetta Shawl   Patient/Guardian was advised Release of Information must be obtained prior to any record release in order to collaborate their care with an outside provider. Patient/Guardian was advised if they have not already done so to contact the registration department to sign all necessary forms in order for [place] to release information regarding their care.    Consent: Patient/Guardian gives verbal consent for treatment and assignment of benefits for services provided during this visit. Patient/Guardian expressed understanding and agreed to proceed.       I discussed the assessment and treatment plan with the patient. The patient was provided an opportunity to ask questions and all were answered. The patient agreed with the plan and demonstrated an understanding of the instructions.   The patient was advised to call back or seek an in-person evaluation if the  symptoms worsen or if the condition fails to improve as anticipated.   I provided 30 minutes of non-face-to-face time during this encounter   Winfred Burn, [place]   10/20/2021

## 2021-10-22 ENCOUNTER — Other Ambulatory Visit: Payer: Self-pay | Admitting: Psychiatry

## 2021-10-22 ENCOUNTER — Telehealth: Payer: Self-pay

## 2021-10-22 MED ORDER — BUPROPION HCL ER (XL) 150 MG PO TB24: 150.0000 mg | ORAL_TABLET | Freq: Every day | ORAL | 1 refills | Status: DC

## 2021-10-22 NOTE — Telephone Encounter (Signed)
pt called states that the new medications you wanted her to start is still not at the [facility].  please send it to [facility].

## 2021-10-22 NOTE — Telephone Encounter (Signed)
Sorry, it is ordered.

## 2021-10-23 DIAGNOSIS — G8929 Other chronic pain: Secondary | ICD-10-CM | POA: Diagnosis not present

## 2021-10-23 DIAGNOSIS — F17210 Nicotine dependence, cigarettes, uncomplicated: Secondary | ICD-10-CM | POA: Diagnosis not present

## 2021-10-23 DIAGNOSIS — M62830 Muscle spasm of back: Secondary | ICD-10-CM | POA: Diagnosis not present

## 2021-10-23 DIAGNOSIS — Z79899 Other long term (current) drug therapy: Secondary | ICD-10-CM | POA: Diagnosis not present

## 2021-10-23 DIAGNOSIS — M5416 Radiculopathy, lumbar region: Secondary | ICD-10-CM | POA: Diagnosis not present

## 2021-10-23 DIAGNOSIS — Z6826 Body mass index (BMI) 26.0-26.9, adult: Secondary | ICD-10-CM | POA: Diagnosis not present

## 2021-10-23 DIAGNOSIS — M129 Arthropathy, unspecified: Secondary | ICD-10-CM | POA: Diagnosis not present

## 2021-10-23 DIAGNOSIS — F17200 Nicotine dependence, unspecified, uncomplicated: Secondary | ICD-10-CM | POA: Diagnosis not present

## 2021-10-23 DIAGNOSIS — Z1159 Encounter for screening for other viral diseases: Secondary | ICD-10-CM | POA: Diagnosis not present

## 2021-10-23 DIAGNOSIS — M25561 Pain in right knee: Secondary | ICD-10-CM | POA: Diagnosis not present

## 2021-10-23 DIAGNOSIS — E559 Vitamin D deficiency, unspecified: Secondary | ICD-10-CM | POA: Diagnosis not present

## 2021-10-27 ENCOUNTER — Other Ambulatory Visit: Payer: Self-pay | Admitting: Internal Medicine

## 2021-10-27 DIAGNOSIS — Z79899 Other long term (current) drug therapy: Secondary | ICD-10-CM | POA: Diagnosis not present

## 2021-10-27 DIAGNOSIS — Z139 Encounter for screening, unspecified: Secondary | ICD-10-CM

## 2021-11-02 DIAGNOSIS — M25561 Pain in right knee: Secondary | ICD-10-CM | POA: Diagnosis not present

## 2021-11-02 DIAGNOSIS — Z9181 History of falling: Secondary | ICD-10-CM | POA: Diagnosis not present

## 2021-11-02 DIAGNOSIS — G8929 Other chronic pain: Secondary | ICD-10-CM | POA: Diagnosis not present

## 2021-11-02 DIAGNOSIS — R030 Elevated blood-pressure reading, without diagnosis of hypertension: Secondary | ICD-10-CM | POA: Diagnosis not present

## 2021-11-02 DIAGNOSIS — Z79899 Other long term (current) drug therapy: Secondary | ICD-10-CM | POA: Diagnosis not present

## 2021-11-02 DIAGNOSIS — M5416 Radiculopathy, lumbar region: Secondary | ICD-10-CM | POA: Diagnosis not present

## 2021-11-02 DIAGNOSIS — M62830 Muscle spasm of back: Secondary | ICD-10-CM | POA: Diagnosis not present

## 2021-11-02 DIAGNOSIS — F17200 Nicotine dependence, unspecified, uncomplicated: Secondary | ICD-10-CM | POA: Diagnosis not present

## 2021-11-02 DIAGNOSIS — F17210 Nicotine dependence, cigarettes, uncomplicated: Secondary | ICD-10-CM | POA: Diagnosis not present

## 2021-11-02 DIAGNOSIS — Z6826 Body mass index (BMI) 26.0-26.9, adult: Secondary | ICD-10-CM | POA: Diagnosis not present

## 2021-11-05 DIAGNOSIS — Z79899 Other long term (current) drug therapy: Secondary | ICD-10-CM | POA: Diagnosis not present

## 2021-11-09 ENCOUNTER — Other Ambulatory Visit: Payer: Self-pay | Admitting: Psychiatry

## 2021-11-09 DIAGNOSIS — F419 Anxiety disorder, unspecified: Secondary | ICD-10-CM

## 2021-11-10 ENCOUNTER — Ambulatory Visit (INDEPENDENT_AMBULATORY_CARE_PROVIDER_SITE_OTHER): Payer: Medicare Other | Admitting: Clinical

## 2021-11-10 ENCOUNTER — Other Ambulatory Visit: Payer: Self-pay

## 2021-11-10 DIAGNOSIS — F331 Major depressive disorder, recurrent, moderate: Secondary | ICD-10-CM | POA: Diagnosis not present

## 2021-11-10 DIAGNOSIS — F419 Anxiety disorder, unspecified: Secondary | ICD-10-CM | POA: Diagnosis not present

## 2021-11-10 NOTE — Plan of Care (Signed)
Verbal Consent 

## 2021-11-10 NOTE — Progress Notes (Signed)
Virtual Visit via Telephone Note ?  ?I connected with Paula Bailey on 11/10/21 at 11:00 AM EST by telephone and verified that I am speaking with the correct person using two identifiers. ?  ?Location: ?Patient: Home ?Provider: Office ?  ?I discussed the limitations, risks, security and privacy concerns of performing an evaluation and management service by telephone and the availability of in person appointments. I also discussed with the patient that there may be a patient responsible charge related to this service. The patient expressed understanding and agreed to proceed. ?  ?THERAPIST PROGRESS NOTE ?  ?Session Time: 11:00 AM-11:30 AM ?  ?Participation Level: Active ?  ?Behavioral Response: CasualAlertDepressed ?  ?Type of Therapy: Individual Therapy ?  ?Treatment Goals addressed: Coping ?  ?Interventions: CBT, DBT, Solution Focused and Supportive ?  ?Summary: Paula Bailey is a 61 y.o. female who presents with Depression and Anxiety.  The OPT therapist worked with the patient for her ongoing OPT treatment. The OPT therapist utilized Motivational Interviewing to assist in creating therapeutic repore. The patient in the session was engaged and work in collaboration giving feedback about her triggers and symptoms over the past few weeks. The patient spoke about a change in her Medication Therapy program with medication Wellbutrin being added and noticing a improvement since the most recent medication adjustment. The patient will be following up in April with her prescriber. The OPT therapist utilized Cognitive Behavioral Therapy through cognitive restructuring as well as worked with the patient reviewing coping strategies to assist in management of  depression and anxiety as well as strategies to build energy and functioning through improving her diet, exercise, and sleep. The patient is currently working with a ortho specialist to manage knee pain. ?  ?Suicidal/Homicidal: Nowithout intent/plan ?  ?Therapist  Response: The OPT therapist worked with the patient for the patients  scheduled session. The patient was engaged in her session and gave feedback in relation to triggers, symptoms, and behavior responses over the past few weeks. The OPT therapist worked with the patient utilizing an in session Cognitive Behavioral Therapy exercise. The patient was responsive in the session and verbalized, " I am so thankful and happy that my medicine is working and I can tell a difference and my sister can tell a difference and says she has been noticing a big difference". The OPT therapist worked with the patient on her basic needs eating, sleeping, hygiene, and physical exercise and instructed the patient to continue her self care . The OPT therapist will continue treatment work with the patient in her next scheduled session. ?  ?Plan: Return again in 2 weeks. ?  ?Diagnosis:      Axis I: Recurrent Depression Moderate, with Anxiety ?  ?                        Axis II: No diagnosis ?  ?Collaboration of Care:  Additional collaboration for this session connected with psychiatrist Dr. Vanetta Shawl ?  ?Patient/Guardian was advised Release of Information must be obtained prior to any record release in order to collaborate their care with an outside provider. Patient/Guardian was advised if they have not already done so to contact the registration department to sign all necessary forms in order for [place] to release information regarding their care.  ?  ?Consent: Patient/Guardian gives verbal consent for treatment and assignment of benefits for services provided during this visit. Patient/Guardian expressed understanding and agreed to proceed.  ?  ?  ?I  discussed the assessment and treatment plan with the patient. The patient was provided an opportunity to ask questions and all were answered. The patient agreed with the plan and demonstrated an understanding of the instructions. ?  ?The patient was advised to call back or seek an in-person  evaluation if the symptoms worsen or if the condition fails to improve as anticipated. ?  ?I provided 30 minutes of non-face-to-face time during this encounter ?  ?Winfred Burn, LCSW ?  ?11/10/2021 ?

## 2021-11-11 ENCOUNTER — Inpatient Hospital Stay: Admission: RE | Admit: 2021-11-11 | Payer: Medicare Other | Source: Ambulatory Visit

## 2021-11-19 NOTE — Progress Notes (Deleted)
BH MD/PA/NP OP Progress Note ? ?11/19/2021 8:32 AM ?Paula Bailey  ?MRN:  [id number] ? ?Chief Complaint: No chief complaint on file. ? ?HPI: *** ?Visit Diagnosis: No diagnosis found. ? ?Past Psychiatric History: Please see initial evaluation for full details. I have reviewed the history. No updates at this time.  ?  ? ?Past Medical History:  ?Past Medical History:  ?Diagnosis Date  ? ARDS (adult respiratory distress syndrome) ([place])   ? Dyslipidemia   ?  ?Past Surgical History:  ?Procedure Laterality Date  ? ABDOMINAL HYSTERECTOMY    ? CHOLECYSTECTOMY    ? TONSILLECTOMY    ? ? ?Family Psychiatric History: Please see initial evaluation for full details. I have reviewed the history. No updates at this time.  ?  ? ?Family History:  ?Family History  ?Problem Relation Age of Onset  ? CAD Father   ? CAD Brother   ? ? ?Social History:  ?Social History  ? ?Socioeconomic History  ? Marital status: Divorced  ?  Spouse name: Not on file  ? Number of children: Not on file  ? Years of education: Not on file  ? Highest education level: Not on file  ?Occupational History  ? Not on file  ?Tobacco Use  ? Smoking status: Every Day  ?  Packs/day: 0.50  ?  Years: 40.00  ?  Pack years: 20.00  ?  Types: Cigarettes  ?  Start date: 11/13/1981  ? Smokeless tobacco: Never  ?Vaping Use  ? Vaping Use: Never used  ?Substance and Sexual Activity  ? Alcohol use: No  ? Drug use: Not Currently  ? Sexual activity: Not Currently  ?Other Topics Concern  ? Not on file  ?Social History Narrative  ? Not on file  ? ?Social Determinants of Health  ? ?Financial Resource Strain: Not on file  ?Food Insecurity: Not on file  ?Transportation Needs: Not on file  ?Physical Activity: Not on file  ?Stress: Not on file  ?Social Connections: Not on file  ? ? ?Allergies:  ?Allergies  ?Allergen Reactions  ? Hydrocodone Rash  ? ? ?Metabolic Disorder Labs: ?No results found for: HGBA1C, MPG ?No results found for: PROLACTIN ?Lab Results  ?Component Value Date  ? TRIG 155 (H)  07/20/2020  ? ?No results found for: TSH ? ?Therapeutic Level Labs: ?No results found for: LITHIUM ?No results found for: VALPROATE ?No components found for:  CBMZ ? ?Current Medications: ?Current Outpatient Medications  ?Medication Sig Dispense Refill  ? alendronate (FOSAMAX) 70 MG tablet Take 70 mg by mouth once a week.  6  ? ALPRAZolam (XANAX) 0.5 MG tablet Take 0.25 mg by mouth at bedtime.   0  ? amitriptyline (ELAVIL) 10 MG tablet Take 20 mg by mouth at bedtime.    ? buPROPion (WELLBUTRIN XL) 150 MG 24 hr tablet Take 1 tablet (150 mg total) by mouth daily. 30 tablet 1  ? gabapentin (NEURONTIN) 300 MG capsule Take 600 mg by mouth 3 (three) times daily.   2  ? INCRUSE ELLIPTA 62.5 MCG/INH AEPB INHALE ONE PUFF EVERY DAY 30 each 2  ? methocarbamol (ROBAXIN) 500 MG tablet Take 500 mg by mouth 3 (three) times daily as needed for muscle spasms.    ? oxyCODONE-acetaminophen (PERCOCET/ROXICET) 5-325 MG tablet SMARTSIG:1 Tablet(s) By Mouth Every 12 Hours    ? sertraline (ZOLOFT) 100 MG tablet Take 1.5 tablets (150 mg total) by mouth daily. 135 tablet 0  ? simvastatin (ZOCOR) 10 MG tablet Take 10 mg by mouth  at bedtime.    ? VENTOLIN HFA 108 (90 Base) MCG/ACT inhaler Inhale 1-2 puffs into the lungs every 4 (four) hours as needed for wheezing or shortness of breath.   12  ? ?No current facility-administered medications for this visit.  ? ? ? ?Musculoskeletal: ?Strength & Muscle Tone:  N/A ?Gait & Station:  N/A ?Patient leans: N/A ? ?Psychiatric Specialty Exam: ?Review of Systems  ?There were no vitals taken for this visit.There is no height or weight on file to calculate BMI.  ?General Appearance: {Appearance:22683}  ?Eye Contact:  {BHH EYE CONTACT:22684}  ?Speech:  Clear and Coherent  ?Volume:  Normal  ?Mood:  {BHH MOOD:22306}  ?Affect:  {Affect (PAA):22687}  ?Thought Process:  Coherent  ?Orientation:  Full (Time, Place, and Person)  ?Thought Content: Logical   ?Suicidal Thoughts:  {ST/HT (PAA):22692}  ?Homicidal  Thoughts:  {ST/HT (PAA):22692}  ?Memory:  Immediate;   Good  ?Judgement:  {Judgement (PAA):22694}  ?Insight:  {Insight (PAA):22695}  ?Psychomotor Activity:  Normal  ?Concentration:  Concentration: Good and Attention Span: Good  ?Recall:  Good  ?Fund of Knowledge: Good  ?Language: Good  ?Akathisia:  No  ?Handed:  Right  ?AIMS (if indicated): not done  ?Assets:  Communication Skills ?Desire for Improvement  ?ADL's:  Intact  ?Cognition: WNL  ?Sleep:  {BHH GOOD/FAIR/POOR:22877}  ? ?Screenings: ?GAD-7   ? ?Flowsheet Row Counselor from 07/29/2021 in [place]  ?Total GAD-7 Score 11  ? ?  ? ?PHQ2-9   ? ?[place] Office Visit from 10/19/2021 in [place] Office Visit from 09/07/2021 in [place] from 07/29/2021 in [place] Video Visit from 05/05/2021 in [place] Video Visit from 01/28/2021 in [place]  ?PHQ-2 Total Score 4 2 4  0 0  ?PHQ-9 Total Score 11 4 7  -- --  ? ?  ? ?[place] Office Visit from 10/19/2021 in [place] Counselor from 07/29/2021 in [place] Video Visit from 05/05/2021 in [place]  ?C-SSRS RISK CATEGORY No Risk No Risk No Risk  ? ?  ? ? ? ?Assessment and Plan:  ?Paula Bailey is a 61 y.o. year old female with a history of depression, r/o intellectual disability, who presents for follow up appointment for below.  ? ?  ?1. Recurrent moderate major depressive disorder with anxiety ([place]) ?She reports worsening in depressive symptoms with prominent fatigue since the last visit despite up titration of sertraline without significant triggers.  Will lower the dose of sertraline given its limited benefit.  Will add bupropion adjunctive treatment for depression.  She has no known  history of seizure.  Discussed potential risk of headache and worsening in anxiety, insomnia.  Coached behavioral activation.  She will continue to see her therapist.  ?  ?Plan ?Decrease sertraline 150 mg daily ?Start bupropion 150 mg daily  ?Next appointment :3/28 at 1:20 for 20 mins, video ?  ?- TSH reportedly normal per patient (not known when it was checked the last time) ?(She has been on amitriptyline 20 mg qhs, oxycodone,  Xanax 0.5 mg daily as needed, and oxycodone for anxiety by other provider) ?  ?Past trials of medication: Paxil, duloxetine (drowsiness), bupropion, Xanax ? ? ? ?  ? ?Collaboration of Care: Collaboration of Care: {BH OP Collaboration of [id number] ? ?Patient/Guardian was advised Release of Information must be obtained prior to any record release in order to collaborate  their care with an outside provider. Patient/Guardian was advised if they have not already done so to contact the registration department to sign all necessary forms in order for [place] to release information regarding their care.  ? ?Consent: Patient/Guardian gives verbal consent for treatment and assignment of benefits for services provided during this visit. Patient/Guardian expressed understanding and agreed to proceed.  ? ? ?Norman Clay, MD ?11/19/2021, 8:32 AM ? ?

## 2021-11-24 ENCOUNTER — Telehealth: Payer: Self-pay | Admitting: Psychiatry

## 2021-11-24 ENCOUNTER — Other Ambulatory Visit: Payer: Self-pay

## 2021-11-24 ENCOUNTER — Telehealth: Payer: Medicare Other | Admitting: Psychiatry

## 2021-11-24 NOTE — Telephone Encounter (Signed)
Sent link for video visit through Epic. Patient did not sign in. Called the patient twice for appointment scheduled today. The patient did not answer the phone. Voice message was full. °

## 2021-11-26 DIAGNOSIS — M62830 Muscle spasm of back: Secondary | ICD-10-CM | POA: Diagnosis not present

## 2021-11-26 DIAGNOSIS — Z79899 Other long term (current) drug therapy: Secondary | ICD-10-CM | POA: Diagnosis not present

## 2021-11-26 DIAGNOSIS — Z6826 Body mass index (BMI) 26.0-26.9, adult: Secondary | ICD-10-CM | POA: Diagnosis not present

## 2021-11-26 DIAGNOSIS — M5416 Radiculopathy, lumbar region: Secondary | ICD-10-CM | POA: Diagnosis not present

## 2021-11-26 DIAGNOSIS — F17200 Nicotine dependence, unspecified, uncomplicated: Secondary | ICD-10-CM | POA: Diagnosis not present

## 2021-11-26 DIAGNOSIS — M25561 Pain in right knee: Secondary | ICD-10-CM | POA: Diagnosis not present

## 2021-11-26 DIAGNOSIS — G8929 Other chronic pain: Secondary | ICD-10-CM | POA: Diagnosis not present

## 2021-11-26 DIAGNOSIS — F17210 Nicotine dependence, cigarettes, uncomplicated: Secondary | ICD-10-CM | POA: Diagnosis not present

## 2021-11-26 DIAGNOSIS — Z9181 History of falling: Secondary | ICD-10-CM | POA: Diagnosis not present

## 2021-11-30 DIAGNOSIS — Z79899 Other long term (current) drug therapy: Secondary | ICD-10-CM | POA: Diagnosis not present

## 2021-12-01 ENCOUNTER — Ambulatory Visit (HOSPITAL_COMMUNITY): Payer: Medicare Other | Admitting: Clinical

## 2021-12-01 ENCOUNTER — Telehealth (HOSPITAL_COMMUNITY): Payer: Self-pay | Admitting: Clinical

## 2021-12-01 NOTE — Telephone Encounter (Signed)
The patient did not respond to video link or phone contact. ?

## 2021-12-06 ENCOUNTER — Other Ambulatory Visit: Payer: Self-pay | Admitting: Psychiatry

## 2021-12-09 ENCOUNTER — Other Ambulatory Visit: Payer: Self-pay

## 2021-12-15 ENCOUNTER — Other Ambulatory Visit: Payer: Self-pay | Admitting: Psychiatry

## 2021-12-29 DIAGNOSIS — F339 Major depressive disorder, recurrent, unspecified: Secondary | ICD-10-CM | POA: Diagnosis not present

## 2021-12-29 DIAGNOSIS — F17200 Nicotine dependence, unspecified, uncomplicated: Secondary | ICD-10-CM | POA: Diagnosis not present

## 2021-12-29 DIAGNOSIS — Z9181 History of falling: Secondary | ICD-10-CM | POA: Diagnosis not present

## 2021-12-29 DIAGNOSIS — F17210 Nicotine dependence, cigarettes, uncomplicated: Secondary | ICD-10-CM | POA: Diagnosis not present

## 2021-12-29 DIAGNOSIS — Z79899 Other long term (current) drug therapy: Secondary | ICD-10-CM | POA: Diagnosis not present

## 2021-12-29 DIAGNOSIS — M62830 Muscle spasm of back: Secondary | ICD-10-CM | POA: Diagnosis not present

## 2021-12-29 DIAGNOSIS — M5416 Radiculopathy, lumbar region: Secondary | ICD-10-CM | POA: Diagnosis not present

## 2021-12-29 DIAGNOSIS — G8929 Other chronic pain: Secondary | ICD-10-CM | POA: Diagnosis not present

## 2021-12-29 DIAGNOSIS — M25561 Pain in right knee: Secondary | ICD-10-CM | POA: Diagnosis not present

## 2021-12-29 DIAGNOSIS — E663 Overweight: Secondary | ICD-10-CM | POA: Diagnosis not present

## 2021-12-29 DIAGNOSIS — Z0000 Encounter for general adult medical examination without abnormal findings: Secondary | ICD-10-CM | POA: Diagnosis not present

## 2022-01-01 DIAGNOSIS — Z79899 Other long term (current) drug therapy: Secondary | ICD-10-CM | POA: Diagnosis not present

## 2022-01-06 ENCOUNTER — Ambulatory Visit
Admission: RE | Admit: 2022-01-06 | Discharge: 2022-01-06 | Disposition: A | Discharge status: Home / Self Care | Payer: Medicare Other | Source: Ambulatory Visit | Attending: Internal Medicine | Admitting: Internal Medicine

## 2022-01-06 DIAGNOSIS — Z1231 Encounter for screening mammogram for malignant neoplasm of breast: Secondary | ICD-10-CM | POA: Diagnosis not present

## 2022-01-06 DIAGNOSIS — Z139 Encounter for screening, unspecified: Secondary | ICD-10-CM

## 2022-01-10 ENCOUNTER — Other Ambulatory Visit: Payer: Self-pay | Admitting: Psychiatry

## 2022-01-11 NOTE — Telephone Encounter (Signed)
Dr.Hisada's patient °

## 2022-01-11 NOTE — Telephone Encounter (Signed)
Refill request of bupropion declined at this time as she should have several days of tabs left. Please contact the patient and advise to have follow up appointment.  ?

## 2022-01-24 DIAGNOSIS — F331 Major depressive disorder, recurrent, moderate: Secondary | ICD-10-CM | POA: Diagnosis not present

## 2022-01-24 DIAGNOSIS — J440 Chronic obstructive pulmonary disease with acute lower respiratory infection: Secondary | ICD-10-CM | POA: Diagnosis present

## 2022-01-24 DIAGNOSIS — R29818 Other symptoms and signs involving the nervous system: Secondary | ICD-10-CM | POA: Diagnosis not present

## 2022-01-24 DIAGNOSIS — Z720 Tobacco use: Secondary | ICD-10-CM | POA: Diagnosis not present

## 2022-01-24 DIAGNOSIS — M549 Dorsalgia, unspecified: Secondary | ICD-10-CM | POA: Diagnosis not present

## 2022-01-24 DIAGNOSIS — I429 Cardiomyopathy, unspecified: Secondary | ICD-10-CM | POA: Diagnosis not present

## 2022-01-24 DIAGNOSIS — I517 Cardiomegaly: Secondary | ICD-10-CM | POA: Diagnosis not present

## 2022-01-24 DIAGNOSIS — F17210 Nicotine dependence, cigarettes, uncomplicated: Secondary | ICD-10-CM | POA: Diagnosis not present

## 2022-01-24 DIAGNOSIS — R4182 Altered mental status, unspecified: Secondary | ICD-10-CM | POA: Diagnosis not present

## 2022-01-24 DIAGNOSIS — F339 Major depressive disorder, recurrent, unspecified: Secondary | ICD-10-CM | POA: Diagnosis not present

## 2022-01-24 DIAGNOSIS — I10 Essential (primary) hypertension: Secondary | ICD-10-CM | POA: Diagnosis not present

## 2022-01-24 DIAGNOSIS — R0600 Dyspnea, unspecified: Secondary | ICD-10-CM | POA: Diagnosis not present

## 2022-01-24 DIAGNOSIS — R402412 Glasgow coma scale score 13-15, at arrival to emergency department: Secondary | ICD-10-CM | POA: Diagnosis not present

## 2022-01-24 DIAGNOSIS — J189 Pneumonia, unspecified organism: Secondary | ICD-10-CM | POA: Diagnosis not present

## 2022-01-24 DIAGNOSIS — Z79891 Long term (current) use of opiate analgesic: Secondary | ICD-10-CM | POA: Diagnosis not present

## 2022-01-24 DIAGNOSIS — J811 Chronic pulmonary edema: Secondary | ICD-10-CM | POA: Diagnosis not present

## 2022-01-24 DIAGNOSIS — R410 Disorientation, unspecified: Secondary | ICD-10-CM | POA: Diagnosis not present

## 2022-01-24 DIAGNOSIS — Z20822 Contact with and (suspected) exposure to covid-19: Secondary | ICD-10-CM | POA: Diagnosis not present

## 2022-01-24 DIAGNOSIS — J449 Chronic obstructive pulmonary disease, unspecified: Secondary | ICD-10-CM | POA: Diagnosis not present

## 2022-01-24 DIAGNOSIS — Z79899 Other long term (current) drug therapy: Secondary | ICD-10-CM | POA: Diagnosis not present

## 2022-01-24 DIAGNOSIS — I6782 Cerebral ischemia: Secondary | ICD-10-CM | POA: Diagnosis not present

## 2022-01-24 DIAGNOSIS — F05 Delirium due to known physiological condition: Secondary | ICD-10-CM | POA: Diagnosis not present

## 2022-01-24 DIAGNOSIS — G894 Chronic pain syndrome: Secondary | ICD-10-CM | POA: Diagnosis not present

## 2022-01-24 DIAGNOSIS — M5416 Radiculopathy, lumbar region: Secondary | ICD-10-CM | POA: Diagnosis not present

## 2022-01-24 DIAGNOSIS — J90 Pleural effusion, not elsewhere classified: Secondary | ICD-10-CM | POA: Diagnosis not present

## 2022-01-24 DIAGNOSIS — E860 Dehydration: Secondary | ICD-10-CM | POA: Diagnosis not present

## 2022-01-25 DIAGNOSIS — M5416 Radiculopathy, lumbar region: Secondary | ICD-10-CM | POA: Diagnosis not present

## 2022-01-25 DIAGNOSIS — G894 Chronic pain syndrome: Secondary | ICD-10-CM | POA: Diagnosis not present

## 2022-01-25 DIAGNOSIS — F339 Major depressive disorder, recurrent, unspecified: Secondary | ICD-10-CM | POA: Diagnosis not present

## 2022-01-25 DIAGNOSIS — R4182 Altered mental status, unspecified: Secondary | ICD-10-CM | POA: Diagnosis not present

## 2022-01-25 DIAGNOSIS — R29818 Other symptoms and signs involving the nervous system: Secondary | ICD-10-CM | POA: Diagnosis not present

## 2022-01-25 DIAGNOSIS — F05 Delirium due to known physiological condition: Secondary | ICD-10-CM | POA: Diagnosis not present

## 2022-01-25 DIAGNOSIS — Z79899 Other long term (current) drug therapy: Secondary | ICD-10-CM | POA: Diagnosis not present

## 2022-01-25 DIAGNOSIS — F17210 Nicotine dependence, cigarettes, uncomplicated: Secondary | ICD-10-CM | POA: Diagnosis not present

## 2022-01-25 DIAGNOSIS — J449 Chronic obstructive pulmonary disease, unspecified: Secondary | ICD-10-CM | POA: Diagnosis not present

## 2022-01-25 DIAGNOSIS — I6782 Cerebral ischemia: Secondary | ICD-10-CM | POA: Diagnosis not present

## 2022-01-25 DIAGNOSIS — I429 Cardiomyopathy, unspecified: Secondary | ICD-10-CM | POA: Diagnosis not present

## 2022-01-29 DIAGNOSIS — M25561 Pain in right knee: Secondary | ICD-10-CM | POA: Diagnosis not present

## 2022-01-29 DIAGNOSIS — E663 Overweight: Secondary | ICD-10-CM | POA: Diagnosis not present

## 2022-01-29 DIAGNOSIS — F339 Major depressive disorder, recurrent, unspecified: Secondary | ICD-10-CM | POA: Diagnosis not present

## 2022-01-29 DIAGNOSIS — F17200 Nicotine dependence, unspecified, uncomplicated: Secondary | ICD-10-CM | POA: Diagnosis not present

## 2022-01-29 DIAGNOSIS — M5416 Radiculopathy, lumbar region: Secondary | ICD-10-CM | POA: Diagnosis not present

## 2022-01-29 DIAGNOSIS — Z131 Encounter for screening for diabetes mellitus: Secondary | ICD-10-CM | POA: Diagnosis not present

## 2022-01-29 DIAGNOSIS — Z9181 History of falling: Secondary | ICD-10-CM | POA: Diagnosis not present

## 2022-01-29 DIAGNOSIS — G8929 Other chronic pain: Secondary | ICD-10-CM | POA: Diagnosis not present

## 2022-01-29 DIAGNOSIS — F17210 Nicotine dependence, cigarettes, uncomplicated: Secondary | ICD-10-CM | POA: Diagnosis not present

## 2022-01-29 DIAGNOSIS — Z79899 Other long term (current) drug therapy: Secondary | ICD-10-CM | POA: Diagnosis not present

## 2022-02-01 DIAGNOSIS — D692 Other nonthrombocytopenic purpura: Secondary | ICD-10-CM | POA: Diagnosis not present

## 2022-02-01 DIAGNOSIS — R410 Disorientation, unspecified: Secondary | ICD-10-CM | POA: Diagnosis not present

## 2022-02-01 DIAGNOSIS — Z09 Encounter for follow-up examination after completed treatment for conditions other than malignant neoplasm: Secondary | ICD-10-CM | POA: Diagnosis not present

## 2022-02-01 DIAGNOSIS — F17210 Nicotine dependence, cigarettes, uncomplicated: Secondary | ICD-10-CM | POA: Diagnosis not present

## 2022-02-01 DIAGNOSIS — Z299 Encounter for prophylactic measures, unspecified: Secondary | ICD-10-CM | POA: Diagnosis not present

## 2022-02-01 DIAGNOSIS — J449 Chronic obstructive pulmonary disease, unspecified: Secondary | ICD-10-CM | POA: Diagnosis not present

## 2022-02-04 ENCOUNTER — Other Ambulatory Visit: Payer: Self-pay | Admitting: Psychiatry

## 2022-02-04 DIAGNOSIS — F419 Anxiety disorder, unspecified: Secondary | ICD-10-CM

## 2022-02-04 NOTE — Telephone Encounter (Signed)
Declined a refill of sertraline as she would still have several tabs left. Could either of you contact the patient to make a follow up.  appointment, and let me know so that I can sent in refill. If she does not answer the phone, please mail the patient to contact us.

## 2022-02-05 ENCOUNTER — Telehealth (INDEPENDENT_AMBULATORY_CARE_PROVIDER_SITE_OTHER): Payer: Medicare Other | Admitting: Psychiatry

## 2022-02-05 ENCOUNTER — Encounter: Payer: Self-pay | Admitting: Psychiatry

## 2022-02-05 DIAGNOSIS — F331 Major depressive disorder, recurrent, moderate: Secondary | ICD-10-CM

## 2022-02-05 DIAGNOSIS — F419 Anxiety disorder, unspecified: Secondary | ICD-10-CM | POA: Diagnosis not present

## 2022-02-05 MED ORDER — SERTRALINE HCL 100 MG PO TABS: 150.0000 mg | ORAL_TABLET | Freq: Every day | ORAL | 1 refills | Status: AC

## 2022-02-05 MED ORDER — BUPROPION HCL ER (XL) 150 MG PO TB24: 150.0000 mg | ORAL_TABLET | Freq: Every day | ORAL | 0 refills | Status: DC

## 2022-02-05 NOTE — Progress Notes (Signed)
Virtual Visit via Video Note  I connected with Paula Bailey on 02/05/22 at 10:30 AM EDT by a video enabled telemedicine application and verified that I am speaking with the correct person using two identifiers.  Location: Patient: outside Provider: office Persons participated in the visit- patient, provider    I discussed the limitations of evaluation and management by telemedicine and the availability of in person appointments. The patient expressed understanding and agreed to proceed.     I discussed the assessment and treatment plan with the patient. The patient was provided an opportunity to ask questions and all were answered. The patient agreed with the plan and demonstrated an understanding of the instructions.   The patient was advised to call back or seek an in-person evaluation if the symptoms worsen or if the condition fails to improve as anticipated.  I provided 9 minutes of non-face-to-face time during this encounter.   Paula Clay, MD      [facility] MD/PA/NP OP Progress Note  02/05/2022 10:46 AM Paula Bailey  MRN:  [id number]  Chief Complaint:  Chief Complaint  Patient presents with   Follow-up   Depression   HPI:  This is a follow-up appointment for depression.  She states that she has not been able to keep her appointment as she was sick for several weeks due to pneumonia.  She was admitted to ICU.  She was discharged 2 weeks ago, and she has been doing since then.  Her sister at home is very supportive.  Although she ran out of bupropion, she thinks she was doing better, feeling less depressed and have more motivation when she was on that medication.  She takes a bath twice a week.  She is trying to do it more frequently.  She denies change in appetite.  She denies SI.  She is willing to take both bupropion and sertraline for her mood.    Visit Diagnosis:    ICD-10-CM   1. Recurrent moderate major depressive disorder with anxiety (HCC)  F33.1    F41.9     2.  Anxiety  F41.9 sertraline (ZOLOFT) 100 MG tablet      Past Psychiatric History: Please see initial evaluation for full details. I have reviewed the history. No updates at this time.     Past Medical History:  Past Medical History:  Diagnosis Date   ARDS (adult respiratory distress syndrome) ([place])    Dyslipidemia     Past Surgical History:  Procedure Laterality Date   ABDOMINAL HYSTERECTOMY     CHOLECYSTECTOMY     TONSILLECTOMY      Family Psychiatric History: Please see initial evaluation for full details. I have reviewed the history. No updates at this time.     Family History:  Family History  Problem Relation Age of Onset   Breast cancer Mother    CAD Father    CAD Brother     Social History:  Social History   Socioeconomic History   Marital status: Divorced    Spouse name: Not on file   Number of children: Not on file   Years of education: Not on file   Highest education level: Not on file  Occupational History   Not on file  Tobacco Use   Smoking status: Every Day    Packs/day: 0.50    Years: 40.00    Total pack years: 20.00    Types: Cigarettes    Start date: 11/13/1981   Smokeless tobacco: Never  Vaping Use  Vaping Use: Never used  Substance and Sexual Activity   Alcohol use: No   Drug use: Not Currently   Sexual activity: Not Currently  Other Topics Concern   Not on file  Social History Narrative   Not on file   Social Determinants of Health   Financial Resource Strain: Not on file  Food Insecurity: Not on file  Transportation Needs: Not on file  Physical Activity: Not on file  Stress: Not on file  Social Connections: Not on file    Allergies:  Allergies  Allergen Reactions   Hydrocodone Rash    Metabolic Disorder Labs: No results found for: "HGBA1C", "MPG" No results found for: "PROLACTIN" Lab Results  Component Value Date   TRIG 155 (H) 07/20/2020   No results found for: "TSH"  Therapeutic Level Labs: No results found  for: "LITHIUM" No results found for: "VALPROATE" No results found for: "CBMZ"  Current Medications: Current Outpatient Medications  Medication Sig Dispense Refill   alendronate (FOSAMAX) 70 MG tablet Take 70 mg by mouth once a week.  6   amitriptyline (ELAVIL) 10 MG tablet Take 20 mg by mouth at bedtime.     buPROPion (WELLBUTRIN XL) 150 MG 24 hr tablet Take 1 tablet (150 mg total) by mouth daily. 90 tablet 0   gabapentin (NEURONTIN) 300 MG capsule Take 600 mg by mouth 3 (three) times daily.   2   INCRUSE ELLIPTA 62.5 MCG/INH AEPB INHALE ONE PUFF EVERY DAY 30 each 2   methocarbamol (ROBAXIN) 500 MG tablet Take 500 mg by mouth 3 (three) times daily as needed for muscle spasms.     oxyCODONE-acetaminophen (PERCOCET/ROXICET) 5-325 MG tablet SMARTSIG:1 Tablet(s) By Mouth Every 12 Hours     sertraline (ZOLOFT) 100 MG tablet Take 1.5 tablets (150 mg total) by mouth daily. 135 tablet 1   simvastatin (ZOCOR) 10 MG tablet Take 10 mg by mouth at bedtime.     VENTOLIN HFA 108 (90 Base) MCG/ACT inhaler Inhale 1-2 puffs into the lungs every 4 (four) hours as needed for wheezing or shortness of breath.   12   No current facility-administered medications for this visit.     Musculoskeletal: Strength & Muscle Tone:  N/A Gait & Station:  N/A Patient leans: N/A  Psychiatric Specialty Exam: Review of Systems  Psychiatric/Behavioral:  Positive for sleep disturbance. Negative for agitation, behavioral problems, confusion, decreased concentration, dysphoric mood, hallucinations, self-injury and suicidal ideas. The patient is nervous/anxious. The patient is not hyperactive.   All other systems reviewed and are negative.   There were no vitals taken for this visit.There is no height or weight on file to calculate BMI.  General Appearance: Fairly Groomed  Eye Contact:  Good  Speech:  Clear and Coherent  Volume:  Normal  Mood:   good  Affect:  Appropriate and Congruent  Thought Process:  Coherent   Orientation:  Full (Time, Place, and Person)  Thought Content: Logical   Suicidal Thoughts:  No  Homicidal Thoughts:  No  Memory:  Immediate;   Good  Judgement:  Good  Insight:  Good  Psychomotor Activity:  Normal  Concentration:  Concentration: Good and Attention Span: Good  Recall:  Good  Fund of Knowledge: Good  Language: Good  Akathisia:  No  Handed:  Right  AIMS (if indicated): not done  Assets:  Communication Skills Desire for Improvement  ADL's:  Intact  Cognition: WNL  Sleep:  Fair   Screenings: GAD-7    Health and safety inspector from  07/29/2021 in [facility]  Total GAD-7 Score 11      PHQ2-9    [place] Office Visit from 10/19/2021 in [place] Office Visit from 09/07/2021 in [place] Counselor from 07/29/2021 in [facility] Video Visit from 05/05/2021 in Home Video Visit from 01/28/2021 in [place]  PHQ-2 Total Score 4 2 4  0 0  PHQ-9 Total Score 11 4 7  -- --      [place] Office Visit from 10/19/2021 in [place] Counselor from 07/29/2021 in [facility] [facility] Video Visit from 05/05/2021 in [place] No Risk No Risk No Risk        Assessment and Plan:  Anaria Sieker is a 61 y.o. year old female with a history of depression, r/o intellectual disability, who presents for follow up appointment for below.   2. Recurrent moderate major depressive disorder with anxiety ([place]) 1. Anxiety She reports overall improvement in depressive symptoms and fatigue since starting bupropion.  Recent psychosocial stressors includes pneumonia/admission.  Will continue current dose of sertraline and bupropion to target depression and anxiety.   Coached behavioral activation.    Plan Continue sertraline 150 mg daily Continue bupropion 150 mg daily  Next appointment : 8/11 at 11:30, video   - TSH reportedly normal per patient (not known when it was checked the last time) (She has been on amitriptyline 20 mg qhs, oxycodone  by other provider)   Past trials of medication: Paxil, duloxetine (drowsiness), bupropion, Xanax   The patient demonstrates the following risk factors for suicide: Chronic risk factors for suicide include: psychiatric disorder of depression. Acute risk factors for suicide include: unemployment. Protective factors for this patient include: positive social support, coping skills and hope for the future. Considering these factors, the overall suicide risk at this point appears to be low     Collaboration of Care: Collaboration of Care: Other N/A  Patient/Guardian was advised Release of Information must be obtained prior to any record release in order to collaborate their care with an outside provider. Patient/Guardian was advised if they have not already done so to contact the registration department to sign all necessary forms in order for [place] to release information regarding their care.   Consent: Patient/Guardian gives verbal consent for treatment and assignment of benefits for services provided during this visit. Patient/Guardian expressed understanding and agreed to proceed.    Paula Clay, MD 02/05/2022, 10:46 AM

## 2022-02-05 NOTE — Patient Instructions (Signed)
Continue sertraline 150 mg daily Continue bupropion 150 mg daily  Next appointment : 8/11 at 11:30

## 2022-02-09 DIAGNOSIS — E2839 Other primary ovarian failure: Secondary | ICD-10-CM | POA: Diagnosis not present

## 2022-02-14 DIAGNOSIS — M5416 Radiculopathy, lumbar region: Secondary | ICD-10-CM | POA: Diagnosis not present

## 2022-02-14 DIAGNOSIS — F17200 Nicotine dependence, unspecified, uncomplicated: Secondary | ICD-10-CM | POA: Diagnosis not present

## 2022-02-14 DIAGNOSIS — Z79899 Other long term (current) drug therapy: Secondary | ICD-10-CM | POA: Diagnosis not present

## 2022-02-14 DIAGNOSIS — G8929 Other chronic pain: Secondary | ICD-10-CM | POA: Diagnosis not present

## 2022-02-14 DIAGNOSIS — E663 Overweight: Secondary | ICD-10-CM | POA: Diagnosis not present

## 2022-02-14 DIAGNOSIS — F17210 Nicotine dependence, cigarettes, uncomplicated: Secondary | ICD-10-CM | POA: Diagnosis not present

## 2022-02-14 DIAGNOSIS — M25561 Pain in right knee: Secondary | ICD-10-CM | POA: Diagnosis not present

## 2022-02-14 DIAGNOSIS — Z9181 History of falling: Secondary | ICD-10-CM | POA: Diagnosis not present

## 2022-02-14 DIAGNOSIS — F339 Major depressive disorder, recurrent, unspecified: Secondary | ICD-10-CM | POA: Diagnosis not present

## 2022-02-17 DIAGNOSIS — Z79899 Other long term (current) drug therapy: Secondary | ICD-10-CM | POA: Diagnosis not present

## 2022-03-23 DIAGNOSIS — Z79899 Other long term (current) drug therapy: Secondary | ICD-10-CM | POA: Diagnosis not present

## 2022-03-23 DIAGNOSIS — M25561 Pain in right knee: Secondary | ICD-10-CM | POA: Diagnosis not present

## 2022-03-23 DIAGNOSIS — G629 Polyneuropathy, unspecified: Secondary | ICD-10-CM | POA: Diagnosis not present

## 2022-03-23 DIAGNOSIS — I700 Atherosclerosis of aorta: Secondary | ICD-10-CM | POA: Diagnosis not present

## 2022-03-23 DIAGNOSIS — F17200 Nicotine dependence, unspecified, uncomplicated: Secondary | ICD-10-CM | POA: Diagnosis not present

## 2022-03-23 DIAGNOSIS — Z6827 Body mass index (BMI) 27.0-27.9, adult: Secondary | ICD-10-CM | POA: Diagnosis not present

## 2022-03-23 DIAGNOSIS — G8929 Other chronic pain: Secondary | ICD-10-CM | POA: Diagnosis not present

## 2022-03-23 DIAGNOSIS — M62830 Muscle spasm of back: Secondary | ICD-10-CM | POA: Diagnosis not present

## 2022-03-23 DIAGNOSIS — Z9181 History of falling: Secondary | ICD-10-CM | POA: Diagnosis not present

## 2022-03-23 DIAGNOSIS — J449 Chronic obstructive pulmonary disease, unspecified: Secondary | ICD-10-CM | POA: Diagnosis not present

## 2022-03-23 DIAGNOSIS — F17210 Nicotine dependence, cigarettes, uncomplicated: Secondary | ICD-10-CM | POA: Diagnosis not present

## 2022-03-23 DIAGNOSIS — F339 Major depressive disorder, recurrent, unspecified: Secondary | ICD-10-CM | POA: Diagnosis not present

## 2022-03-23 DIAGNOSIS — M5416 Radiculopathy, lumbar region: Secondary | ICD-10-CM | POA: Diagnosis not present

## 2022-03-25 DIAGNOSIS — Z79899 Other long term (current) drug therapy: Secondary | ICD-10-CM | POA: Diagnosis not present

## 2022-04-07 NOTE — Progress Notes (Signed)
Virtual Visit via Video Note  I connected with Paula Bailey on 04/09/22 at 11:30 AM EDT by a video enabled telemedicine application and verified that I am speaking with the correct person using two identifiers.  Location: Patient: home Provider: office Persons participated in the visit- patient, provider    I discussed the limitations of evaluation and management by telemedicine and the availability of in person appointments. The patient expressed understanding and agreed to proceed.     I discussed the assessment and treatment plan with the patient. The patient was provided an opportunity to ask questions and all were answered. The patient agreed with the plan and demonstrated an understanding of the instructions.   The patient was advised to call back or seek an in-person evaluation if the symptoms worsen or if the condition fails to improve as anticipated.  I provided 12 minutes of non-face-to-face time during this encounter.   [name], MD    [facility] MD/PA/NP OP Progress Note  04/09/2022 11:57 AM Paula Bailey  MRN:  [id number]  Chief Complaint:  Chief Complaint  Patient presents with   Follow-up   Depression   HPI:  This is a follow-up appointment for depression.  She states that she feels so depressed.  She does not know why.  She does not think her medications were working, and would like to know what to do  She goes back and forth during the day.  She may read Bibles at times.  She tries to go outside, walking to the mailbox.  She has been taking a bath twice a week.  She was advised by her sister to discuss these with this Clinical research associate.  She states that she has been taking medication consistently.  She has occasional insomnia.  She has good appetite.  She denies SI.  She denies hallucinations.  She is willing to try higher dose of bupropion at this time.     Visit Diagnosis:    ICD-10-CM   1. Recurrent moderate major depressive disorder with anxiety (HCC)  F33.1    F41.9        Past Psychiatric History: Please see initial evaluation for full details. I have reviewed the history. No updates at this time.     Past Medical History:  Past Medical History:  Diagnosis Date   ARDS (adult respiratory distress syndrome) (HCC)    Dyslipidemia     Past Surgical History:  Procedure Laterality Date   ABDOMINAL HYSTERECTOMY     CHOLECYSTECTOMY     TONSILLECTOMY      Family Psychiatric History: Please see initial evaluation for full details. I have reviewed the history. No updates at this time.     Family History:  Family History  Problem Relation Age of Onset   Breast cancer Mother    CAD Father    CAD Brother     Social History:  Social History   Socioeconomic History   Marital status: Divorced    Spouse name: Not on file   Number of children: Not on file   Years of education: Not on file   Highest education level: Not on file  Occupational History   Not on file  Tobacco Use   Smoking status: Every Day    Packs/day: 0.50    Years: 40.00    Total pack years: 20.00    Types: Cigarettes    Start date: 11/13/1981   Smokeless tobacco: Never  Vaping Use   Vaping Use: Never used  Substance and Sexual Activity  Alcohol use: No   Drug use: Not Currently   Sexual activity: Not Currently  Other Topics Concern   Not on file  Social History Narrative   Not on file   Social Determinants of Health   Financial Resource Strain: Not on file  Food Insecurity: Not on file  Transportation Needs: Not on file  Physical Activity: Not on file  Stress: Not on file  Social Connections: Not on file    Allergies:  Allergies  Allergen Reactions   Hydrocodone Rash    Metabolic Disorder Labs: No results found for: "HGBA1C", "MPG" No results found for: "PROLACTIN" Lab Results  Component Value Date   TRIG 155 (H) 07/20/2020   No results found for: "TSH"  Therapeutic Level Labs: No results found for: "LITHIUM" No results found for:  "VALPROATE" No results found for: "CBMZ"  Current Medications: Current Outpatient Medications  Medication Sig Dispense Refill   buPROPion (WELLBUTRIN XL) 300 MG 24 hr tablet Take 1 tablet (300 mg total) by mouth daily. 30 tablet 1   alendronate (FOSAMAX) 70 MG tablet Take 70 mg by mouth once a week.  6   amitriptyline (ELAVIL) 10 MG tablet Take 20 mg by mouth at bedtime.     gabapentin (NEURONTIN) 300 MG capsule Take 600 mg by mouth 3 (three) times daily.   2   INCRUSE ELLIPTA 62.5 MCG/INH AEPB INHALE ONE PUFF EVERY DAY 30 each 2   methocarbamol (ROBAXIN) 500 MG tablet Take 500 mg by mouth 3 (three) times daily as needed for muscle spasms.     oxyCODONE-acetaminophen (PERCOCET/ROXICET) 5-325 MG tablet SMARTSIG:1 Tablet(s) By Mouth Every 12 Hours     sertraline (ZOLOFT) 100 MG tablet Take 1.5 tablets (150 mg total) by mouth daily. 135 tablet 1   simvastatin (ZOCOR) 10 MG tablet Take 10 mg by mouth at bedtime.     VENTOLIN HFA 108 (90 Base) MCG/ACT inhaler Inhale 1-2 puffs into the lungs every 4 (four) hours as needed for wheezing or shortness of breath.   12   No current facility-administered medications for this visit.     Musculoskeletal: Strength & Muscle Tone:  N/A Gait & Station:  N/A Patient leans: N/A  Psychiatric Specialty Exam: Review of Systems  Psychiatric/Behavioral:  Positive for dysphoric mood and sleep disturbance. Negative for agitation, behavioral problems, confusion, decreased concentration, hallucinations, self-injury and suicidal ideas. The patient is nervous/anxious. The patient is not hyperactive.   All other systems reviewed and are negative.   There were no vitals taken for this visit.There is no height or weight on file to calculate BMI.  General Appearance: Fairly Groomed  Eye Contact:  Good  Speech:  Clear and Coherent  Volume:  Normal  Mood:  Depressed  Affect:  Appropriate, Congruent, and down  Thought Process:  Coherent  Orientation:  Full (Time,  Place, and Person)  Thought Content: Logical   Suicidal Thoughts:  No  Homicidal Thoughts:  No  Memory:  Immediate;   Good  Judgement:  Good  Insight:  Present  Psychomotor Activity:  Normal  Concentration:  Concentration: Good and Attention Span: Good  Recall:  Good  Fund of Knowledge: Good  Language: Good  Akathisia:  No  Handed:  Right  AIMS (if indicated): not done  Assets:  Communication Skills Desire for Improvement  ADL's:  Intact  Cognition: WNL  Sleep:  Fair   Screenings: GAD-7    Advertising copywriter from [facility]  Total GAD-7  Score 11      PHQ2-9    [place] Visit from 10/19/2021 in [place] Office Visit from 09/07/2021 in [place] from 07/29/2021 in [facility] Video Visit from 05/05/2021 in [place] Video Visit from 01/28/2021 in [facility]  PHQ-2 Total Score 4 2 4  0 0  PHQ-9 Total Score 11 4 7  -- --      [place] Office Visit from 10/19/2021 in [place] Counselor from 07/29/2021 in [facility] Video Visit from 05/05/2021 in [place] No Risk No Risk No Risk        Assessment and Plan:  Paula Bailey is a 61 y.o. year old female with a history of depression, r/o intellectual disability, who presents for follow up appointment for below.   1. Recurrent moderate major depressive disorder with anxiety ([place]) There has been significant worsening in depressive symptoms without any triggers.  We uptitrate bupropion to optimize treatment for depression.  Will continue sertraline to target depression and anxiety.  Coached behavioral activation.   Plan Continue sertraline 150 mg daily Increase  bupropion 300 mg daily  Next appointment : 9/28 at 3 PM, video   - TSH reportedly normal per patient (not known when it was checked the last time) (She has been on amitriptyline 20 mg qhs, oxycodone  by other provider)   Past trials of medication: Paxil, duloxetine (drowsiness), bupropion, Xanax   The patient demonstrates the following risk factors for suicide: Chronic risk factors for suicide include: psychiatric disorder of depression. Acute risk factors for suicide include: unemployment. Protective factors for this patient include: positive social support, coping skills and hope for the future. Considering these factors, the overall suicide risk at this point appears to be low       Collaboration of Care: Collaboration of Care: Other N/A  Patient/Guardian was advised Release of Information must be obtained prior to any record release in order to collaborate their care with an outside provider. Patient/Guardian was advised if they have not already done so to contact the registration department to sign all necessary forms in order for [place] to release information regarding their care.   Consent: Patient/Guardian gives verbal consent for treatment and assignment of benefits for services provided during this visit. Patient/Guardian expressed understanding and agreed to proceed.    Norman Clay, MD 04/09/2022, 11:57 AM

## 2022-04-09 ENCOUNTER — Encounter: Payer: Self-pay | Admitting: Psychiatry

## 2022-04-09 ENCOUNTER — Telehealth (INDEPENDENT_AMBULATORY_CARE_PROVIDER_SITE_OTHER): Payer: Medicare Other | Admitting: Psychiatry

## 2022-04-09 DIAGNOSIS — F419 Anxiety disorder, unspecified: Secondary | ICD-10-CM

## 2022-04-09 DIAGNOSIS — F331 Major depressive disorder, recurrent, moderate: Secondary | ICD-10-CM | POA: Diagnosis not present

## 2022-04-09 MED ORDER — BUPROPION HCL ER (XL) 300 MG PO TB24: 300.0000 mg | ORAL_TABLET | Freq: Every day | ORAL | 1 refills | Status: DC

## 2022-04-09 NOTE — Patient Instructions (Signed)
Continue sertraline 150 mg daily Increase bupropion 300 mg daily  Next appointment : 9/28 at 3 PM

## 2022-04-23 DIAGNOSIS — E663 Overweight: Secondary | ICD-10-CM | POA: Diagnosis not present

## 2022-04-23 DIAGNOSIS — F17210 Nicotine dependence, cigarettes, uncomplicated: Secondary | ICD-10-CM | POA: Diagnosis not present

## 2022-04-23 DIAGNOSIS — M62830 Muscle spasm of back: Secondary | ICD-10-CM | POA: Diagnosis not present

## 2022-04-23 DIAGNOSIS — Z79899 Other long term (current) drug therapy: Secondary | ICD-10-CM | POA: Diagnosis not present

## 2022-04-23 DIAGNOSIS — I700 Atherosclerosis of aorta: Secondary | ICD-10-CM | POA: Diagnosis not present

## 2022-04-23 DIAGNOSIS — J449 Chronic obstructive pulmonary disease, unspecified: Secondary | ICD-10-CM | POA: Diagnosis not present

## 2022-04-23 DIAGNOSIS — G629 Polyneuropathy, unspecified: Secondary | ICD-10-CM | POA: Diagnosis not present

## 2022-04-23 DIAGNOSIS — F17200 Nicotine dependence, unspecified, uncomplicated: Secondary | ICD-10-CM | POA: Diagnosis not present

## 2022-04-23 DIAGNOSIS — M5416 Radiculopathy, lumbar region: Secondary | ICD-10-CM | POA: Diagnosis not present

## 2022-04-23 DIAGNOSIS — M25561 Pain in right knee: Secondary | ICD-10-CM | POA: Diagnosis not present

## 2022-04-23 DIAGNOSIS — G8929 Other chronic pain: Secondary | ICD-10-CM | POA: Diagnosis not present

## 2022-04-23 DIAGNOSIS — Z9181 History of falling: Secondary | ICD-10-CM | POA: Diagnosis not present

## 2022-04-23 DIAGNOSIS — F339 Major depressive disorder, recurrent, unspecified: Secondary | ICD-10-CM | POA: Diagnosis not present

## 2022-04-28 DIAGNOSIS — Z79899 Other long term (current) drug therapy: Secondary | ICD-10-CM | POA: Diagnosis not present

## 2022-05-21 DIAGNOSIS — R7303 Prediabetes: Secondary | ICD-10-CM | POA: Diagnosis not present

## 2022-05-21 DIAGNOSIS — M25561 Pain in right knee: Secondary | ICD-10-CM | POA: Diagnosis not present

## 2022-05-21 DIAGNOSIS — Z23 Encounter for immunization: Secondary | ICD-10-CM | POA: Diagnosis not present

## 2022-05-21 DIAGNOSIS — G8929 Other chronic pain: Secondary | ICD-10-CM | POA: Diagnosis not present

## 2022-05-21 DIAGNOSIS — M5416 Radiculopathy, lumbar region: Secondary | ICD-10-CM | POA: Diagnosis not present

## 2022-05-21 DIAGNOSIS — I700 Atherosclerosis of aorta: Secondary | ICD-10-CM | POA: Diagnosis not present

## 2022-05-21 DIAGNOSIS — Z79899 Other long term (current) drug therapy: Secondary | ICD-10-CM | POA: Diagnosis not present

## 2022-05-21 DIAGNOSIS — E663 Overweight: Secondary | ICD-10-CM | POA: Diagnosis not present

## 2022-05-21 DIAGNOSIS — F339 Major depressive disorder, recurrent, unspecified: Secondary | ICD-10-CM | POA: Diagnosis not present

## 2022-05-21 DIAGNOSIS — F17210 Nicotine dependence, cigarettes, uncomplicated: Secondary | ICD-10-CM | POA: Diagnosis not present

## 2022-05-21 DIAGNOSIS — F17200 Nicotine dependence, unspecified, uncomplicated: Secondary | ICD-10-CM | POA: Diagnosis not present

## 2022-05-21 DIAGNOSIS — J449 Chronic obstructive pulmonary disease, unspecified: Secondary | ICD-10-CM | POA: Diagnosis not present

## 2022-05-21 DIAGNOSIS — Z9181 History of falling: Secondary | ICD-10-CM | POA: Diagnosis not present

## 2022-05-24 ENCOUNTER — Other Ambulatory Visit: Payer: Self-pay | Admitting: Psychiatry

## 2022-05-24 NOTE — Progress Notes (Deleted)
[place] MD/PA/NP OP Progress Note  05/24/2022 2:28 PM Paula Bailey  MRN:  [id number]  Chief Complaint: No chief complaint on file.  HPI: *** Visit Diagnosis: No diagnosis found.  Past Psychiatric History: Please see initial evaluation for full details. I have reviewed the history. No updates at this time.     Past Medical History:  Past Medical History:  Diagnosis Date   ARDS (adult respiratory distress syndrome) ([place])    Dyslipidemia     Past Surgical History:  Procedure Laterality Date   ABDOMINAL HYSTERECTOMY     CHOLECYSTECTOMY     TONSILLECTOMY      Family Psychiatric History: Please see initial evaluation for full details. I have reviewed the history. No updates at this time.     Family History:  Family History  Problem Relation Age of Onset   Breast cancer Mother    CAD Father    CAD Brother     Social History:  Social History   Socioeconomic History   Marital status: Divorced    Spouse name: Not on file   Number of children: Not on file   Years of education: Not on file   Highest education level: Not on file  Occupational History   Not on file  Tobacco Use   Smoking status: Every Day    Packs/day: 0.50    Years: 40.00    Total pack years: 20.00    Types: Cigarettes    Start date: 11/13/1981   Smokeless tobacco: Never  Vaping Use   Vaping Use: Never used  Substance and Sexual Activity   Alcohol use: No   Drug use: Not Currently   Sexual activity: Not Currently  Other Topics Concern   Not on file  Social History Narrative   Not on file   Social Determinants of Health   Financial Resource Strain: Not on file  Food Insecurity: Not on file  Transportation Needs: Not on file  Physical Activity: Not on file  Stress: Not on file  Social Connections: Not on file    Allergies:  Allergies  Allergen Reactions   Hydrocodone Rash    Metabolic Disorder Labs: No results found for: "HGBA1C", "MPG" No results found for: "PROLACTIN" Lab Results   Component Value Date   TRIG 155 (H) 07/20/2020   No results found for: "TSH"  Therapeutic Level Labs: No results found for: "LITHIUM" No results found for: "VALPROATE" No results found for: "CBMZ"  Current Medications: Current Outpatient Medications  Medication Sig Dispense Refill   alendronate (FOSAMAX) 70 MG tablet Take 70 mg by mouth once a week.  6   amitriptyline (ELAVIL) 10 MG tablet Take 20 mg by mouth at bedtime.     buPROPion (WELLBUTRIN XL) 300 MG 24 hr tablet Take 1 tablet (300 mg total) by mouth daily. 30 tablet 1   gabapentin (NEURONTIN) 300 MG capsule Take 600 mg by mouth 3 (three) times daily.   2   INCRUSE ELLIPTA 62.5 MCG/INH AEPB INHALE ONE PUFF EVERY DAY 30 each 2   methocarbamol (ROBAXIN) 500 MG tablet Take 500 mg by mouth 3 (three) times daily as needed for muscle spasms.     oxyCODONE-acetaminophen (PERCOCET/ROXICET) 5-325 MG tablet SMARTSIG:1 Tablet(s) By Mouth Every 12 Hours     sertraline (ZOLOFT) 100 MG tablet Take 1.5 tablets (150 mg total) by mouth daily. 135 tablet 1   simvastatin (ZOCOR) 10 MG tablet Take 10 mg by mouth at bedtime.     VENTOLIN HFA 108 (90 Base)  MCG/ACT inhaler Inhale 1-2 puffs into the lungs every 4 (four) hours as needed for wheezing or shortness of breath.   12   No current facility-administered medications for this visit.     Musculoskeletal: Strength & Muscle Tone:  N/A Gait & Station:  N/A Patient leans: N/A  Psychiatric Specialty Exam: Review of Systems  There were no vitals taken for this visit.There is no height or weight on file to calculate BMI.  General Appearance: {Appearance:22683}  Eye Contact:  {BHH EYE CONTACT:22684}  Speech:  Clear and Coherent  Volume:  Normal  Mood:  {BHH MOOD:22306}  Affect:  {Affect (PAA):22687}  Thought Process:  Coherent  Orientation:  Full (Time, Place, and Person)  Thought Content: Logical   Suicidal Thoughts:  {ST/HT (PAA):22692}  Homicidal Thoughts:  {ST/HT (PAA):22692}   Memory:  Immediate;   Good  Judgement:  {Judgement (PAA):22694}  Insight:  {Insight (PAA):22695}  Psychomotor Activity:  Normal  Concentration:  Concentration: Good and Attention Span: Good  Recall:  Good  Fund of Knowledge: Good  Language: Good  Akathisia:  No  Handed:  Right  AIMS (if indicated): not done  Assets:  Communication Skills Desire for Improvement  ADL's:  Intact  Cognition: WNL  Sleep:  {BHH GOOD/FAIR/POOR:22877}   Screenings: GAD-7    Flowsheet Row Counsel[facility]  Total GAD-7 Score 11      PHQ2-9    Flowsheet Row Office Visit from 10/19/2021 in [facility] Office Visit from 09/07/2021 in [facility] Counselor from 07/29/2021 [facility] Video Visit from 05/05/2021 in [facility] Video Visit from 01/28/2021 in [facility]  PHQ-2 Total Score 4 2 4  0 0  PHQ-9 Total Score 11 4 7  -- --      Flowsheet Row Office Visit from 10/19/2021 in [facility] Counselor from 07/29/2021 [facility] Video Visit from 05/05/2021 in [facility]  C-SSRS RISK CATEGORY No Risk No Risk No Risk        Assessment and Plan:  Paula Bailey is a 61 y.o. year old female with a history of depression, r/o intellectual disability, who presents for follow up appointment for below.   1. Recurrent moderate major depressive disorder with anxiety (HCC) There has been significant worsening in depressive symptoms without any triggers.  We uptitrate bupropion to optimize treatment for depression.  Will continue sertraline to target depression and anxiety.  Coached behavioral activation.    Plan Continue sertraline 150 mg daily Increase bupropion 300 mg daily  Next appointment  : 9/28 at 3 PM, video   - TSH reportedly normal per patient (not known when it was checked the last time) (She has been on amitriptyline 20 mg qhs, oxycodone  by other provider)   Past trials of medication: Paxil, duloxetine (drowsiness), bupropion, Xanax   The patient demonstrates the following risk factors for suicide: Chronic risk factors for suicide include: psychiatric disorder of depression. Acute risk factors for suicide include: unemployment. Protective factors for this patient include: positive social support, coping skills and hope for the future. Considering these factors, the overall suicide risk at this point appears to be low          Collaboration of Care: Collaboration of Care: [facility] OP Collaboration of Care:[id number]}  Patient/Guardian was advised Release of Information must be obtained prior to any record release in order to collaborate their  care with an outside provider. Patient/Guardian was advised if they have not already done so to contact the registration department to sign all necessary forms in order for [place] to release information regarding their care.   Consent: Patient/Guardian gives verbal consent for treatment and assignment of benefits for services provided during this visit. Patient/Guardian expressed understanding and agreed to proceed.    [name], MD 05/24/2022, 2:28 PM

## 2022-05-25 DIAGNOSIS — Z79899 Other long term (current) drug therapy: Secondary | ICD-10-CM | POA: Diagnosis not present

## 2022-05-27 ENCOUNTER — Telehealth: Payer: Self-pay | Admitting: Psychiatry

## 2022-05-27 ENCOUNTER — Telehealth: Payer: Medicare Other | Admitting: Psychiatry

## 2022-05-27 NOTE — Telephone Encounter (Signed)
Sent link for video visit through [place]. Patient did not sign in. Called the patient twice for appointment scheduled today. The patient did not answer the phone. Unable to leave a voice message.

## 2022-06-17 DIAGNOSIS — Z9181 History of falling: Secondary | ICD-10-CM | POA: Diagnosis not present

## 2022-06-17 DIAGNOSIS — M5416 Radiculopathy, lumbar region: Secondary | ICD-10-CM | POA: Diagnosis not present

## 2022-06-17 DIAGNOSIS — G8929 Other chronic pain: Secondary | ICD-10-CM | POA: Diagnosis not present

## 2022-06-17 DIAGNOSIS — M62830 Muscle spasm of back: Secondary | ICD-10-CM | POA: Diagnosis not present

## 2022-06-17 DIAGNOSIS — J449 Chronic obstructive pulmonary disease, unspecified: Secondary | ICD-10-CM | POA: Diagnosis not present

## 2022-06-17 DIAGNOSIS — Z79899 Other long term (current) drug therapy: Secondary | ICD-10-CM | POA: Diagnosis not present

## 2022-06-17 DIAGNOSIS — F339 Major depressive disorder, recurrent, unspecified: Secondary | ICD-10-CM | POA: Diagnosis not present

## 2022-06-17 DIAGNOSIS — F17200 Nicotine dependence, unspecified, uncomplicated: Secondary | ICD-10-CM | POA: Diagnosis not present

## 2022-06-17 DIAGNOSIS — R7303 Prediabetes: Secondary | ICD-10-CM | POA: Diagnosis not present

## 2022-06-17 DIAGNOSIS — F17210 Nicotine dependence, cigarettes, uncomplicated: Secondary | ICD-10-CM | POA: Diagnosis not present

## 2022-06-17 DIAGNOSIS — I700 Atherosclerosis of aorta: Secondary | ICD-10-CM | POA: Diagnosis not present

## 2022-06-17 DIAGNOSIS — M25561 Pain in right knee: Secondary | ICD-10-CM | POA: Diagnosis not present

## 2022-06-17 DIAGNOSIS — E663 Overweight: Secondary | ICD-10-CM | POA: Diagnosis not present

## 2022-06-21 DIAGNOSIS — Z79899 Other long term (current) drug therapy: Secondary | ICD-10-CM | POA: Diagnosis not present

## 2022-07-06 ENCOUNTER — Other Ambulatory Visit: Payer: Self-pay | Admitting: Psychiatry

## 2022-07-06 NOTE — Telephone Encounter (Signed)
Ordered refill per request. Please contact her to have a follow up appointment.

## 2022-07-15 DIAGNOSIS — F339 Major depressive disorder, recurrent, unspecified: Secondary | ICD-10-CM | POA: Diagnosis not present

## 2022-07-15 DIAGNOSIS — M62830 Muscle spasm of back: Secondary | ICD-10-CM | POA: Diagnosis not present

## 2022-07-15 DIAGNOSIS — Z9181 History of falling: Secondary | ICD-10-CM | POA: Diagnosis not present

## 2022-07-15 DIAGNOSIS — Z79899 Other long term (current) drug therapy: Secondary | ICD-10-CM | POA: Diagnosis not present

## 2022-07-15 DIAGNOSIS — G8929 Other chronic pain: Secondary | ICD-10-CM | POA: Diagnosis not present

## 2022-07-15 DIAGNOSIS — F17210 Nicotine dependence, cigarettes, uncomplicated: Secondary | ICD-10-CM | POA: Diagnosis not present

## 2022-07-15 DIAGNOSIS — F17200 Nicotine dependence, unspecified, uncomplicated: Secondary | ICD-10-CM | POA: Diagnosis not present

## 2022-07-15 DIAGNOSIS — M5416 Radiculopathy, lumbar region: Secondary | ICD-10-CM | POA: Diagnosis not present

## 2022-07-15 DIAGNOSIS — G629 Polyneuropathy, unspecified: Secondary | ICD-10-CM | POA: Diagnosis not present

## 2022-07-15 DIAGNOSIS — E663 Overweight: Secondary | ICD-10-CM | POA: Diagnosis not present

## 2022-07-15 DIAGNOSIS — M25561 Pain in right knee: Secondary | ICD-10-CM | POA: Diagnosis not present

## 2022-07-18 DIAGNOSIS — Z79899 Other long term (current) drug therapy: Secondary | ICD-10-CM | POA: Diagnosis not present

## 2022-07-20 ENCOUNTER — Ambulatory Visit: Payer: Medicare Other | Admitting: Psychiatry

## 2022-07-23 ENCOUNTER — Other Ambulatory Visit: Payer: Self-pay | Admitting: Psychiatry

## 2022-07-23 DIAGNOSIS — F419 Anxiety disorder, unspecified: Secondary | ICD-10-CM

## 2022-07-25 NOTE — Telephone Encounter (Signed)
Please contact the patient to schedule follow up appointment. Video is fine, although in person is preferred if possible.

## 2022-07-28 ENCOUNTER — Other Ambulatory Visit: Payer: Self-pay | Admitting: Psychiatry

## 2022-07-28 NOTE — Telephone Encounter (Signed)
Please contact the patient to make a follow up appointment. I will not be able to do any more refills without evaluation.

## 2022-07-28 NOTE — Telephone Encounter (Signed)
LVM to schedule a follow up visit 

## 2022-08-02 NOTE — Telephone Encounter (Signed)
Message left for patient to call and schedule appointment 

## 2022-08-13 DIAGNOSIS — M5416 Radiculopathy, lumbar region: Secondary | ICD-10-CM | POA: Diagnosis not present

## 2022-08-13 DIAGNOSIS — M25561 Pain in right knee: Secondary | ICD-10-CM | POA: Diagnosis not present

## 2022-08-13 DIAGNOSIS — F17210 Nicotine dependence, cigarettes, uncomplicated: Secondary | ICD-10-CM | POA: Diagnosis not present

## 2022-08-13 DIAGNOSIS — F17200 Nicotine dependence, unspecified, uncomplicated: Secondary | ICD-10-CM | POA: Diagnosis not present

## 2022-08-13 DIAGNOSIS — M62830 Muscle spasm of back: Secondary | ICD-10-CM | POA: Diagnosis not present

## 2022-08-13 DIAGNOSIS — Z79899 Other long term (current) drug therapy: Secondary | ICD-10-CM | POA: Diagnosis not present

## 2022-08-13 DIAGNOSIS — G629 Polyneuropathy, unspecified: Secondary | ICD-10-CM | POA: Diagnosis not present

## 2022-08-13 DIAGNOSIS — G8929 Other chronic pain: Secondary | ICD-10-CM | POA: Diagnosis not present

## 2022-08-17 DIAGNOSIS — Z79899 Other long term (current) drug therapy: Secondary | ICD-10-CM | POA: Diagnosis not present

## 2022-08-20 ENCOUNTER — Other Ambulatory Visit: Payer: Self-pay | Admitting: Psychiatry

## 2022-09-03 DIAGNOSIS — J9611 Chronic respiratory failure with hypoxia: Secondary | ICD-10-CM | POA: Diagnosis not present

## 2022-09-03 DIAGNOSIS — J441 Chronic obstructive pulmonary disease with (acute) exacerbation: Secondary | ICD-10-CM | POA: Diagnosis not present

## 2022-09-03 DIAGNOSIS — I700 Atherosclerosis of aorta: Secondary | ICD-10-CM | POA: Diagnosis not present

## 2022-09-03 DIAGNOSIS — J069 Acute upper respiratory infection, unspecified: Secondary | ICD-10-CM | POA: Diagnosis not present

## 2022-09-03 DIAGNOSIS — F17210 Nicotine dependence, cigarettes, uncomplicated: Secondary | ICD-10-CM | POA: Diagnosis not present

## 2022-09-03 DIAGNOSIS — Z299 Encounter for prophylactic measures, unspecified: Secondary | ICD-10-CM | POA: Diagnosis not present

## 2022-09-10 DIAGNOSIS — Z79899 Other long term (current) drug therapy: Secondary | ICD-10-CM | POA: Diagnosis not present

## 2022-09-10 DIAGNOSIS — G629 Polyneuropathy, unspecified: Secondary | ICD-10-CM | POA: Diagnosis not present

## 2022-09-10 DIAGNOSIS — G8929 Other chronic pain: Secondary | ICD-10-CM | POA: Diagnosis not present

## 2022-09-10 DIAGNOSIS — M25561 Pain in right knee: Secondary | ICD-10-CM | POA: Diagnosis not present

## 2022-09-10 DIAGNOSIS — F17200 Nicotine dependence, unspecified, uncomplicated: Secondary | ICD-10-CM | POA: Diagnosis not present

## 2022-09-10 DIAGNOSIS — Z9181 History of falling: Secondary | ICD-10-CM | POA: Diagnosis not present

## 2022-09-10 DIAGNOSIS — M62830 Muscle spasm of back: Secondary | ICD-10-CM | POA: Diagnosis not present

## 2022-09-10 DIAGNOSIS — M5416 Radiculopathy, lumbar region: Secondary | ICD-10-CM | POA: Diagnosis not present

## 2022-09-10 DIAGNOSIS — F17210 Nicotine dependence, cigarettes, uncomplicated: Secondary | ICD-10-CM | POA: Diagnosis not present

## 2022-09-10 DIAGNOSIS — F339 Major depressive disorder, recurrent, unspecified: Secondary | ICD-10-CM | POA: Diagnosis not present

## 2022-09-10 DIAGNOSIS — J449 Chronic obstructive pulmonary disease, unspecified: Secondary | ICD-10-CM | POA: Diagnosis not present

## 2022-09-10 DIAGNOSIS — E663 Overweight: Secondary | ICD-10-CM | POA: Diagnosis not present

## 2022-09-14 DIAGNOSIS — Z79899 Other long term (current) drug therapy: Secondary | ICD-10-CM | POA: Diagnosis not present

## 2022-10-11 DIAGNOSIS — Z9181 History of falling: Secondary | ICD-10-CM | POA: Diagnosis not present

## 2022-10-11 DIAGNOSIS — M62830 Muscle spasm of back: Secondary | ICD-10-CM | POA: Diagnosis not present

## 2022-10-11 DIAGNOSIS — M5416 Radiculopathy, lumbar region: Secondary | ICD-10-CM | POA: Diagnosis not present

## 2022-10-11 DIAGNOSIS — F17210 Nicotine dependence, cigarettes, uncomplicated: Secondary | ICD-10-CM | POA: Diagnosis not present

## 2022-10-11 DIAGNOSIS — G629 Polyneuropathy, unspecified: Secondary | ICD-10-CM | POA: Diagnosis not present

## 2022-10-11 DIAGNOSIS — R7303 Prediabetes: Secondary | ICD-10-CM | POA: Diagnosis not present

## 2022-10-11 DIAGNOSIS — I700 Atherosclerosis of aorta: Secondary | ICD-10-CM | POA: Diagnosis not present

## 2022-10-11 DIAGNOSIS — Z79899 Other long term (current) drug therapy: Secondary | ICD-10-CM | POA: Diagnosis not present

## 2022-10-11 DIAGNOSIS — E663 Overweight: Secondary | ICD-10-CM | POA: Diagnosis not present

## 2022-10-11 DIAGNOSIS — F339 Major depressive disorder, recurrent, unspecified: Secondary | ICD-10-CM | POA: Diagnosis not present

## 2022-10-11 DIAGNOSIS — J449 Chronic obstructive pulmonary disease, unspecified: Secondary | ICD-10-CM | POA: Diagnosis not present

## 2022-10-11 DIAGNOSIS — F17200 Nicotine dependence, unspecified, uncomplicated: Secondary | ICD-10-CM | POA: Diagnosis not present

## 2022-10-11 DIAGNOSIS — M25561 Pain in right knee: Secondary | ICD-10-CM | POA: Diagnosis not present

## 2022-10-12 NOTE — Progress Notes (Unsigned)
[facility] MD/PA/NP OP Progress Note  10/14/2022 11:31 AM Paula Bailey  MRN:  [id number]  Chief Complaint:  Chief Complaint  Patient presents with   Follow-up   HPI:  - she is not seen since August 2023 This is a follow-up appointment for depression.  She states that she is not doing good.  She feels ill.  She has not taken medication since August as she did not feel it was working.  She may go outside, and do crossword puzzle at times. The patient has mood symptoms as in PHQ-9/GAD-7. She denies SI. She denies alcohol use, drug use. She denies any side effect from a higher dose of bupropion. She agrees to try regular exercise, and go to the church.   Her sister presents to the interview.  She thinks Lattie Haw did not have follow-up appointment as she was mad that the medication was not working. Lattie Haw reached out to her and the sister came to the appointment with her. Lattie Haw was able to take a shower from this week, although she was not taking prior to this.  She tends to sleep long hours, and takes a nap up to 1 hour. She thinks Lattie Haw is trying.   Wt Readings from Last 3 Encounters:  10/14/22 142 lb 6.4 oz (64.6 kg)  10/19/21 145 lb 6.4 oz (66 kg)  07/20/20 143 lb (64.9 kg)    Visit Diagnosis:    ICD-10-CM   1. MDD (major depressive disorder), recurrent episode, mild ([place])  F33.0     2. Anxiety  F41.9       Past Psychiatric History: Please see initial evaluation for full details. I have reviewed the history. No updates at this time.     Past Medical History:  Past Medical History:  Diagnosis Date   ARDS (adult respiratory distress syndrome) ([place])    Dyslipidemia     Past Surgical History:  Procedure Laterality Date   ABDOMINAL HYSTERECTOMY     CHOLECYSTECTOMY     TONSILLECTOMY      Family Psychiatric History: Please see initial evaluation for full details. I have reviewed the history. No updates at this time.     Family History:  Family History  Problem Relation Age of Onset    Breast cancer Mother    CAD Father    CAD Brother     Social History:  Social History   Socioeconomic History   Marital status: Divorced    Spouse name: Not on file   Number of children: Not on file   Years of education: Not on file   Highest education level: Not on file  Occupational History   Not on file  Tobacco Use   Smoking status: Every Day    Packs/day: 0.50    Years: 40.00    Total pack years: 20.00    Types: Cigarettes    Start date: 11/13/1981   Smokeless tobacco: Never  Vaping Use   Vaping Use: Never used  Substance and Sexual Activity   Alcohol use: No   Drug use: Not Currently   Sexual activity: Not Currently  Other Topics Concern   Not on file  Social History Narrative   Not on file   Social Determinants of Health   Financial Resource Strain: Not on file  Food Insecurity: Not on file  Transportation Needs: Not on file  Physical Activity: Not on file  Stress: Not on file  Social Connections: Not on file    Allergies:  Allergies  Allergen Reactions  Hydrocodone Rash    Metabolic Disorder Labs: No results found for: "HGBA1C", "MPG" No results found for: "PROLACTIN" Lab Results  Component Value Date   TRIG 155 (H) 07/20/2020   No results found for: "TSH"  Therapeutic Level Labs: No results found for: "LITHIUM" No results found for: "VALPROATE" No results found for: "CBMZ"  Current Medications: Current Outpatient Medications  Medication Sig Dispense Refill   alendronate (FOSAMAX) 70 MG tablet Take 70 mg by mouth once a week.  6   amitriptyline (ELAVIL) 10 MG tablet Take 20 mg by mouth at bedtime.     buPROPion (WELLBUTRIN XL) 150 MG 24 hr tablet Take 1 tablet (150 mg total) by mouth daily. 150 mg daily for 3 days, then 300 mg daily for 3 days, then 450 mg daily (take along with 300 mg tab) 90 tablet 0   gabapentin (NEURONTIN) 300 MG capsule Take 300 mg by mouth 3 (three) times daily.  2   INCRUSE ELLIPTA 62.5 MCG/INH AEPB INHALE ONE  PUFF EVERY DAY 30 each 2   methocarbamol (ROBAXIN) 500 MG tablet Take 500 mg by mouth 3 (three) times daily as needed for muscle spasms.     oxyCODONE-acetaminophen (PERCOCET/ROXICET) 5-325 MG tablet SMARTSIG:1 Tablet(s) By Mouth Every 12 Hours     sertraline (ZOLOFT) 100 MG tablet 50 mg daily for 3 days, then 100 mg daily for 3 days, then 150 mg daily 90 tablet 0   simvastatin (ZOCOR) 10 MG tablet Take 10 mg by mouth at bedtime.     VENTOLIN HFA 108 (90 Base) MCG/ACT inhaler Inhale 1-2 puffs into the lungs every 4 (four) hours as needed for wheezing or shortness of breath.   12   buPROPion (WELLBUTRIN XL) 300 MG 24 hr tablet Take 1 tablet (300 mg total) by mouth daily. Total of 450 mg daily, take along with 150 mg tab 90 tablet 0   sertraline (ZOLOFT) 100 MG tablet Take 1.5 tablets (150 mg total) by mouth daily. 135 tablet 1   No current facility-administered medications for this visit.     Musculoskeletal: Strength & Muscle Tone: within normal limits Gait & Station: normal Patient leans: N/A  Psychiatric Specialty Exam: Review of Systems  Psychiatric/Behavioral:  Positive for dysphoric mood. Negative for agitation, behavioral problems, confusion, decreased concentration, hallucinations, self-injury, sleep disturbance and suicidal ideas. The patient is nervous/anxious. The patient is not hyperactive.   All other systems reviewed and are negative.   Blood pressure 117/76, pulse 97, temperature 98.7 F (37.1 C), temperature source Skin, height 5' 5"$  (1.651 m), weight 142 lb 6.4 oz (64.6 kg).Body mass index is 23.7 kg/m.  General Appearance: Disheveled  Eye Contact:  Fair  Speech:  Clear and Coherent  Volume:  Normal  Mood:  Depressed  Affect:  Appropriate, Congruent, and Tearful  Thought Process:  Coherent  Orientation:  Full (Time, Place, and Person)  Thought Content: Logical   Suicidal Thoughts:  No  Homicidal Thoughts:  No  Memory:  Immediate;   Good  Judgement:  Good   Insight:  Good  Psychomotor Activity:  Normal  Concentration:  Concentration: Good and Attention Span: Good  Recall:  Good  Fund of Knowledge: Good  Language: Good  Akathisia:  No  Handed:  Right  AIMS (if indicated): not done  Assets:  Communication Skills Desire for Improvement  ADL's:  Intact  Cognition: WNL  Sleep:  Fair   Screenings: GAD-7    [place] Office Visit from 10/14/2022 in [facility]  [place] Counselor from 07/29/2021 in [place] [facility] [place]  Total GAD-7 Score 5 [place] Office Visit from 10/14/2022 in [place] Office Visit from 10/19/2021 in [place] Office Visit from 09/07/2021 in [place] Counselor from 07/29/2021 in [place] [facility] [place] Video Visit from 05/05/2021 in [place]  PHQ-2 Total Score 4 4 2 4 $ 0  PHQ-9 Total Score 5 11 4 7 $ --      [place] Office Visit from 10/19/2021 in [place] Counselor from 07/29/2021 in [place] [facility] [place] Video Visit from 05/05/2021 in [place] No Risk No Risk No Risk        Assessment and Plan:  Alyia Bonnin is a 62 y.o. year old female with a history of depression, r/o intellectual disability, who presents for follow up appointment for below.   1. MDD (major depressive disorder), recurrent episode, mild ([place]) 2. Anxiety Acute stressors include:  Other stressors include: back pain    History:   She reports worsening in depressive symptoms in the context of non adherence to medication.  Will restart sertraline, bupropion and try higher dose of bupropion at this time to optimize  treatment for depression.  She has no known history of seizure.  Will consider switching to venlafaxine if she has limited benefit from this medication adjustment.  Coached behavioral activation.    Plan Restart sertraline and increase by 50 mg every 3 days until reaching a daily dose of 150 mg, then continue at that dose Restart bupropion and increase by 150 mg every 3 days until reaching a daily dose of 450 mg, then continue at that dose Next appointment : 4/3 at 4:30, video - on gabapentin 300 mg TID   - TSH reportedly normal per patient (not known when it was checked the last time) (She has been on amitriptyline 20 mg qhs, oxycodone  by other provider)   Past trials of medication: Paxil, duloxetine (drowsiness), bupropion, Xanax   The patient demonstrates the following risk factors for suicide: Chronic risk factors for suicide include: psychiatric disorder of depression. Acute risk factors for suicide include: unemployment. Protective factors for this patient include: positive social support, coping skills and hope for the future. Considering these factors, the overall suicide risk at this point appears to be low      Collaboration of Care: Collaboration of Care: Other reviewed notes in Epic  Patient/Guardian was advised Release of Information must be obtained prior to any record release in order to collaborate their care with an outside provider. Patient/Guardian was advised if they have not already done so to contact the registration department to sign all necessary forms in order for [place] to release information regarding their care.   Consent: Patient/Guardian gives verbal consent for treatment and assignment of benefits for services provided during this visit. Patient/Guardian expressed understanding and agreed to proceed.    Norman Clay, MD 10/14/2022, 11:31 AM

## 2022-10-13 DIAGNOSIS — Z79899 Other long term (current) drug therapy: Secondary | ICD-10-CM | POA: Diagnosis not present

## 2022-10-14 ENCOUNTER — Ambulatory Visit (INDEPENDENT_AMBULATORY_CARE_PROVIDER_SITE_OTHER): Payer: Medicare Other | Admitting: Psychiatry

## 2022-10-14 ENCOUNTER — Encounter: Payer: Self-pay | Admitting: Psychiatry

## 2022-10-14 VITALS — BP 117/76 | HR 97 | Temp 98.7°F | Ht 65.0 in | Wt 142.4 lb

## 2022-10-14 DIAGNOSIS — F419 Anxiety disorder, unspecified: Secondary | ICD-10-CM | POA: Diagnosis not present

## 2022-10-14 DIAGNOSIS — F330 Major depressive disorder, recurrent, mild: Secondary | ICD-10-CM | POA: Diagnosis not present

## 2022-10-14 MED ORDER — BUPROPION HCL ER (XL) 300 MG PO TB24: 300.0000 mg | ORAL_TABLET | Freq: Every day | ORAL | 0 refills | Status: DC

## 2022-10-14 MED ORDER — SERTRALINE HCL 100 MG PO TABS: ORAL_TABLET | ORAL | 0 refills | Status: DC

## 2022-10-14 MED ORDER — BUPROPION HCL ER (XL) 150 MG PO TB24: 150.0000 mg | ORAL_TABLET | Freq: Every day | ORAL | 0 refills | Status: DC

## 2022-10-14 NOTE — Patient Instructions (Signed)
Restart sertraline and increase by 50 mg every 3 days until reaching a daily dose of 150 mg, then continue at that dose Restart bupropion and increase by 150 mg every 3 days until reaching a daily dose of 450 mg, then continue at that dose Next appointment : 4/3 at 4:30,

## 2022-11-08 DIAGNOSIS — Z79899 Other long term (current) drug therapy: Secondary | ICD-10-CM | POA: Diagnosis not present
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
## 2022-12-01 ENCOUNTER — Telehealth (INDEPENDENT_AMBULATORY_CARE_PROVIDER_SITE_OTHER): Payer: Medicare Other | Admitting: Psychiatry

## 2022-12-01 ENCOUNTER — Encounter: Payer: Self-pay | Admitting: Psychiatry

## 2022-12-01 DIAGNOSIS — F419 Anxiety disorder, unspecified: Secondary | ICD-10-CM

## 2022-12-01 DIAGNOSIS — F330 Major depressive disorder, recurrent, mild: Secondary | ICD-10-CM

## 2022-12-01 DIAGNOSIS — F17219 Nicotine dependence, cigarettes, with unspecified nicotine-induced disorders: Secondary | ICD-10-CM

## 2022-12-01 DIAGNOSIS — F17209 Nicotine dependence, unspecified, with unspecified nicotine-induced disorders: Secondary | ICD-10-CM

## 2022-12-01 MED ORDER — NICOTINE 14 MG/24HR TD PT24: 14.0000 mg | MEDICATED_PATCH | Freq: Every day | TRANSDERMAL | 0 refills | Status: DC

## 2022-12-01 NOTE — Patient Instructions (Signed)
Continue sertraline 150 mg daily  Continue bupropion 450 mg daily  Start nicotine patch 14 mg/day for 42 days, followed by 7 mg daily for two weeks Next appointment : 5/1 at 2 pm

## 2022-12-07 DIAGNOSIS — E559 Vitamin D deficiency, unspecified: Secondary | ICD-10-CM | POA: Diagnosis not present

## 2022-12-07 DIAGNOSIS — R7303 Prediabetes: Secondary | ICD-10-CM | POA: Diagnosis not present

## 2022-12-07 DIAGNOSIS — M129 Arthropathy, unspecified: Secondary | ICD-10-CM | POA: Diagnosis not present

## 2022-12-07 DIAGNOSIS — Z79899 Other long term (current) drug therapy: Secondary | ICD-10-CM | POA: Diagnosis not present

## 2022-12-07 DIAGNOSIS — Z1159 Encounter for screening for other viral diseases: Secondary | ICD-10-CM | POA: Diagnosis not present

## 2022-12-09 DIAGNOSIS — Z79899 Other long term (current) drug therapy: Secondary | ICD-10-CM | POA: Diagnosis not present

## 2022-12-10 DIAGNOSIS — J441 Chronic obstructive pulmonary disease with (acute) exacerbation: Secondary | ICD-10-CM | POA: Diagnosis not present

## 2022-12-10 DIAGNOSIS — J9611 Chronic respiratory failure with hypoxia: Secondary | ICD-10-CM | POA: Diagnosis not present

## 2022-12-14 ENCOUNTER — Other Ambulatory Visit: Payer: Self-pay | Admitting: Psychiatry
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
## 2022-12-29 ENCOUNTER — Encounter: Payer: Self-pay | Admitting: Psychiatry

## 2022-12-29 ENCOUNTER — Telehealth (INDEPENDENT_AMBULATORY_CARE_PROVIDER_SITE_OTHER): Payer: Medicare Other | Admitting: Psychiatry

## 2022-12-29 DIAGNOSIS — F330 Major depressive disorder, recurrent, mild: Secondary | ICD-10-CM

## 2022-12-29 DIAGNOSIS — F419 Anxiety disorder, unspecified: Secondary | ICD-10-CM

## 2022-12-29 DIAGNOSIS — F17209 Nicotine dependence, unspecified, with unspecified nicotine-induced disorders: Secondary | ICD-10-CM

## 2022-12-29 MED ORDER — VARENICLINE TARTRATE (STARTER) 0.5 MG X 11 & 1 MG X 42 PO TBPK: ORAL_TABLET | ORAL | 0 refills | Status: AC

## 2022-12-29 MED ORDER — BUPROPION HCL ER (XL) 150 MG PO TB24: 150.0000 mg | ORAL_TABLET | Freq: Every day | ORAL | 1 refills | Status: DC

## 2022-12-29 MED ORDER — BUPROPION HCL ER (XL) 300 MG PO TB24: 300.0000 mg | ORAL_TABLET | Freq: Every day | ORAL | 0 refills | Status: DC

## 2022-12-29 MED ORDER — SERTRALINE HCL 100 MG PO TABS: 200.0000 mg | ORAL_TABLET | Freq: Every day | ORAL | 0 refills | Status: DC

## 2022-12-29 NOTE — Patient Instructions (Signed)
Increase sertraline 200 mg daily  Continue bupropion 450 mg daily  Hold nicotine patch Start chantix as below, start a week after starting sertraline Days 1 to 3: 0.5 mg once daily. Days 4 to 7: 0.5 mg twice daily. Day 8- 1 mg twice a day Next appointment : 6/20 at 3 pm

## 2023-01-05 ENCOUNTER — Other Ambulatory Visit: Payer: Self-pay | Admitting: Psychiatry

## 2023-01-05 DIAGNOSIS — Z79899 Other long term (current) drug therapy: Secondary | ICD-10-CM | POA: Diagnosis not present

## 2023-01-05 DIAGNOSIS — M5416 Radiculopathy, lumbar region: Secondary | ICD-10-CM | POA: Diagnosis not present

## 2023-01-05 DIAGNOSIS — M25561 Pain in right knee: Secondary | ICD-10-CM | POA: Diagnosis not present

## 2023-01-05 DIAGNOSIS — G8929 Other chronic pain: Secondary | ICD-10-CM | POA: Diagnosis not present

## 2023-02-01 DIAGNOSIS — Z79899 Other long term (current) drug therapy: Secondary | ICD-10-CM | POA: Diagnosis not present

## 2023-02-01 DIAGNOSIS — R7303 Prediabetes: Secondary | ICD-10-CM | POA: Diagnosis not present

## 2023-02-01 DIAGNOSIS — M5416 Radiculopathy, lumbar region: Secondary | ICD-10-CM | POA: Diagnosis not present

## 2023-02-01 DIAGNOSIS — G8929 Other chronic pain: Secondary | ICD-10-CM | POA: Diagnosis not present

## 2023-02-01 DIAGNOSIS — E663 Overweight: Secondary | ICD-10-CM | POA: Diagnosis not present

## 2023-02-01 DIAGNOSIS — F17200 Nicotine dependence, unspecified, uncomplicated: Secondary | ICD-10-CM | POA: Diagnosis not present

## 2023-02-01 DIAGNOSIS — M25561 Pain in right knee: Secondary | ICD-10-CM | POA: Diagnosis not present

## 2023-02-01 DIAGNOSIS — E559 Vitamin D deficiency, unspecified: Secondary | ICD-10-CM | POA: Diagnosis not present

## 2023-02-01 DIAGNOSIS — Z0000 Encounter for general adult medical examination without abnormal findings: Secondary | ICD-10-CM | POA: Diagnosis not present

## 2023-02-03 DIAGNOSIS — S50811A Abrasion of right forearm, initial encounter: Secondary | ICD-10-CM | POA: Diagnosis not present

## 2023-02-03 DIAGNOSIS — Z23 Encounter for immunization: Secondary | ICD-10-CM | POA: Diagnosis not present

## 2023-02-03 DIAGNOSIS — S59911A Unspecified injury of right forearm, initial encounter: Secondary | ICD-10-CM | POA: Diagnosis not present

## 2023-02-03 DIAGNOSIS — Z7951 Long term (current) use of inhaled steroids: Secondary | ICD-10-CM | POA: Diagnosis not present

## 2023-02-03 DIAGNOSIS — S5011XA Contusion of right forearm, initial encounter: Secondary | ICD-10-CM | POA: Diagnosis not present

## 2023-02-03 DIAGNOSIS — Z041 Encounter for examination and observation following transport accident: Secondary | ICD-10-CM | POA: Diagnosis not present

## 2023-02-03 DIAGNOSIS — Z885 Allergy status to narcotic agent status: Secondary | ICD-10-CM | POA: Diagnosis not present

## 2023-02-03 DIAGNOSIS — Z79899 Other long term (current) drug therapy: Secondary | ICD-10-CM | POA: Diagnosis not present
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
## 2023-02-17 ENCOUNTER — Encounter: Payer: Self-pay | Admitting: Psychiatry

## 2023-02-17 ENCOUNTER — Telehealth (INDEPENDENT_AMBULATORY_CARE_PROVIDER_SITE_OTHER): Payer: Medicare Other | Admitting: Psychiatry

## 2023-02-17 DIAGNOSIS — F17219 Nicotine dependence, cigarettes, with unspecified nicotine-induced disorders: Secondary | ICD-10-CM | POA: Diagnosis not present

## 2023-02-17 DIAGNOSIS — F17209 Nicotine dependence, unspecified, with unspecified nicotine-induced disorders: Secondary | ICD-10-CM

## 2023-02-17 DIAGNOSIS — F330 Major depressive disorder, recurrent, mild: Secondary | ICD-10-CM | POA: Diagnosis not present

## 2023-02-17 MED ORDER — VARENICLINE TARTRATE 1 MG PO TABS: 1.0000 mg | ORAL_TABLET | Freq: Two times a day (BID) | ORAL | 1 refills | Status: AC

## 2023-02-17 MED ORDER — BUPROPION HCL ER (XL) 300 MG PO TB24: 300.0000 mg | ORAL_TABLET | Freq: Every day | ORAL | 1 refills | Status: AC

## 2023-02-17 MED ORDER — SERTRALINE HCL 100 MG PO TABS: 200.0000 mg | ORAL_TABLET | Freq: Every day | ORAL | 1 refills | Status: AC

## 2023-02-17 NOTE — Patient Instructions (Signed)
Continue sertraline 200 mg daily  Continue bupropion 450 mg daily  Continue valenicline 1 mg twice a day  Next appointment- 8/14 at 11 30

## 2023-03-01 DIAGNOSIS — I700 Atherosclerosis of aorta: Secondary | ICD-10-CM | POA: Diagnosis not present

## 2023-03-01 DIAGNOSIS — M5416 Radiculopathy, lumbar region: Secondary | ICD-10-CM | POA: Diagnosis not present

## 2023-03-01 DIAGNOSIS — Z79899 Other long term (current) drug therapy: Secondary | ICD-10-CM | POA: Diagnosis not present

## 2023-03-01 DIAGNOSIS — F339 Major depressive disorder, recurrent, unspecified: Secondary | ICD-10-CM | POA: Diagnosis not present

## 2023-03-01 DIAGNOSIS — J449 Chronic obstructive pulmonary disease, unspecified: Secondary | ICD-10-CM | POA: Diagnosis not present

## 2023-03-01 DIAGNOSIS — E663 Overweight: Secondary | ICD-10-CM | POA: Diagnosis not present

## 2023-03-01 DIAGNOSIS — F17200 Nicotine dependence, unspecified, uncomplicated: Secondary | ICD-10-CM | POA: Diagnosis not present

## 2023-03-01 DIAGNOSIS — R7303 Prediabetes: Secondary | ICD-10-CM | POA: Diagnosis not present

## 2023-03-01 DIAGNOSIS — E559 Vitamin D deficiency, unspecified: Secondary | ICD-10-CM | POA: Diagnosis not present

## 2023-03-04 DIAGNOSIS — Z79899 Other long term (current) drug therapy: Secondary | ICD-10-CM | POA: Diagnosis not present

## 2023-04-01 DIAGNOSIS — R7303 Prediabetes: Secondary | ICD-10-CM | POA: Diagnosis not present

## 2023-04-01 DIAGNOSIS — E663 Overweight: Secondary | ICD-10-CM | POA: Diagnosis not present

## 2023-04-01 DIAGNOSIS — R030 Elevated blood-pressure reading, without diagnosis of hypertension: Secondary | ICD-10-CM | POA: Diagnosis not present

## 2023-04-01 DIAGNOSIS — Z79899 Other long term (current) drug therapy: Secondary | ICD-10-CM | POA: Diagnosis not present

## 2023-04-01 DIAGNOSIS — M5416 Radiculopathy, lumbar region: Secondary | ICD-10-CM | POA: Diagnosis not present

## 2023-04-01 DIAGNOSIS — E559 Vitamin D deficiency, unspecified: Secondary | ICD-10-CM | POA: Diagnosis not present

## 2023-04-09 NOTE — Progress Notes (Unsigned)
Sent a video visit link through Epic, but the patient didn't sign in. Tried calling for today's appointment, but got no answer. Left a voicemail instructing the patient to contact the office at [phone].  

## 2023-04-13 ENCOUNTER — Telehealth (INDEPENDENT_AMBULATORY_CARE_PROVIDER_SITE_OTHER): Payer: Self-pay | Admitting: Psychiatry

## 2023-04-13 DIAGNOSIS — Z91199 Patient's noncompliance with other medical treatment and regimen due to unspecified reason: Secondary | ICD-10-CM

## 2023-04-22 DIAGNOSIS — R29898 Other symptoms and signs involving the musculoskeletal system: Secondary | ICD-10-CM | POA: Diagnosis not present

## 2023-04-22 DIAGNOSIS — E785 Hyperlipidemia, unspecified: Secondary | ICD-10-CM | POA: Diagnosis not present

## 2023-04-22 DIAGNOSIS — F32A Depression, unspecified: Secondary | ICD-10-CM | POA: Diagnosis not present

## 2023-04-22 DIAGNOSIS — Z885 Allergy status to narcotic agent status: Secondary | ICD-10-CM | POA: Diagnosis not present

## 2023-04-22 DIAGNOSIS — M545 Low back pain, unspecified: Secondary | ICD-10-CM | POA: Diagnosis not present

## 2023-04-22 DIAGNOSIS — R531 Weakness: Secondary | ICD-10-CM | POA: Diagnosis not present

## 2023-04-22 DIAGNOSIS — Z79899 Other long term (current) drug therapy: Secondary | ICD-10-CM | POA: Diagnosis not present

## 2023-04-22 DIAGNOSIS — G8929 Other chronic pain: Secondary | ICD-10-CM | POA: Diagnosis not present

## 2023-04-29 DIAGNOSIS — R7303 Prediabetes: Secondary | ICD-10-CM | POA: Diagnosis not present

## 2023-04-29 DIAGNOSIS — E559 Vitamin D deficiency, unspecified: Secondary | ICD-10-CM | POA: Diagnosis not present

## 2023-04-29 DIAGNOSIS — M5416 Radiculopathy, lumbar region: Secondary | ICD-10-CM | POA: Diagnosis not present

## 2023-04-29 DIAGNOSIS — Z79899 Other long term (current) drug therapy: Secondary | ICD-10-CM | POA: Diagnosis not present

## 2023-04-29 DIAGNOSIS — E663 Overweight: Secondary | ICD-10-CM | POA: Diagnosis not present

## 2023-05-04 DIAGNOSIS — Z79899 Other long term (current) drug therapy: Secondary | ICD-10-CM | POA: Diagnosis not present

## 2023-05-26 DIAGNOSIS — M5416 Radiculopathy, lumbar region: Secondary | ICD-10-CM | POA: Diagnosis not present

## 2023-05-26 DIAGNOSIS — Z79899 Other long term (current) drug therapy: Secondary | ICD-10-CM | POA: Diagnosis not present

## 2023-06-16 DIAGNOSIS — R030 Elevated blood-pressure reading, without diagnosis of hypertension: Secondary | ICD-10-CM | POA: Diagnosis not present

## 2023-06-16 DIAGNOSIS — M25561 Pain in right knee: Secondary | ICD-10-CM | POA: Diagnosis not present

## 2023-06-16 DIAGNOSIS — S8991XA Unspecified injury of right lower leg, initial encounter: Secondary | ICD-10-CM | POA: Diagnosis not present

## 2023-06-16 DIAGNOSIS — Z6824 Body mass index (BMI) 24.0-24.9, adult: Secondary | ICD-10-CM | POA: Diagnosis not present

## 2023-06-25 ENCOUNTER — Other Ambulatory Visit: Payer: Self-pay | Admitting: Psychiatry

## 2023-06-25 NOTE — Telephone Encounter (Signed)
Please contact her to schedule a follow up appointment. Video visit is fine.

## 2023-06-27 NOTE — Telephone Encounter (Signed)
Tried contacting patient by phone multiple times throughout the day, with response of, "your call cannot be completed as dialed, please try your call again." Mychart message sent

## 2023-06-28 DIAGNOSIS — Z79899 Other long term (current) drug therapy: Secondary | ICD-10-CM | POA: Diagnosis not present

## 2023-06-28 DIAGNOSIS — E559 Vitamin D deficiency, unspecified: Secondary | ICD-10-CM | POA: Diagnosis not present

## 2023-06-28 DIAGNOSIS — E663 Overweight: Secondary | ICD-10-CM | POA: Diagnosis not present

## 2023-06-28 DIAGNOSIS — M5416 Radiculopathy, lumbar region: Secondary | ICD-10-CM | POA: Diagnosis not present

## 2023-06-28 DIAGNOSIS — R7303 Prediabetes: Secondary | ICD-10-CM | POA: Diagnosis not present

## 2023-06-28 DIAGNOSIS — Z9181 History of falling: Secondary | ICD-10-CM | POA: Diagnosis not present

## 2023-06-28 DIAGNOSIS — Z23 Encounter for immunization: Secondary | ICD-10-CM | POA: Diagnosis not present

## 2023-06-30 DIAGNOSIS — Z79899 Other long term (current) drug therapy: Secondary | ICD-10-CM | POA: Diagnosis not present

## 2023-07-08 DIAGNOSIS — Z23 Encounter for immunization: Secondary | ICD-10-CM | POA: Diagnosis not present

## 2023-07-26 DIAGNOSIS — F17200 Nicotine dependence, unspecified, uncomplicated: Secondary | ICD-10-CM | POA: Diagnosis not present

## 2023-07-26 DIAGNOSIS — M5416 Radiculopathy, lumbar region: Secondary | ICD-10-CM | POA: Diagnosis not present

## 2023-07-26 DIAGNOSIS — Z9181 History of falling: Secondary | ICD-10-CM | POA: Diagnosis not present

## 2023-07-26 DIAGNOSIS — E663 Overweight: Secondary | ICD-10-CM | POA: Diagnosis not present

## 2023-07-26 DIAGNOSIS — E559 Vitamin D deficiency, unspecified: Secondary | ICD-10-CM | POA: Diagnosis not present

## 2023-07-26 DIAGNOSIS — Z79899 Other long term (current) drug therapy: Secondary | ICD-10-CM | POA: Diagnosis not present

## 2023-07-26 DIAGNOSIS — R7303 Prediabetes: Secondary | ICD-10-CM | POA: Diagnosis not present

## 2023-08-01 DIAGNOSIS — Z79899 Other long term (current) drug therapy: Secondary | ICD-10-CM | POA: Diagnosis not present

## 2023-08-21 DIAGNOSIS — J449 Chronic obstructive pulmonary disease, unspecified: Secondary | ICD-10-CM | POA: Diagnosis not present

## 2023-08-21 DIAGNOSIS — R0789 Other chest pain: Secondary | ICD-10-CM | POA: Diagnosis not present

## 2023-08-21 DIAGNOSIS — R918 Other nonspecific abnormal finding of lung field: Secondary | ICD-10-CM | POA: Diagnosis not present

## 2023-08-21 DIAGNOSIS — Z90710 Acquired absence of both cervix and uterus: Secondary | ICD-10-CM | POA: Diagnosis not present

## 2023-08-21 DIAGNOSIS — Z79899 Other long term (current) drug therapy: Secondary | ICD-10-CM | POA: Diagnosis not present

## 2023-08-21 DIAGNOSIS — R509 Fever, unspecified: Secondary | ICD-10-CM | POA: Diagnosis not present

## 2023-08-21 DIAGNOSIS — R9431 Abnormal electrocardiogram [ECG] [EKG]: Secondary | ICD-10-CM | POA: Diagnosis not present

## 2023-08-21 DIAGNOSIS — R059 Cough, unspecified: Secondary | ICD-10-CM | POA: Diagnosis not present

## 2023-08-21 DIAGNOSIS — J189 Pneumonia, unspecified organism: Secondary | ICD-10-CM | POA: Diagnosis not present

## 2023-08-21 DIAGNOSIS — Z20822 Contact with and (suspected) exposure to covid-19: Secondary | ICD-10-CM | POA: Diagnosis not present

## 2023-08-22 ENCOUNTER — Other Ambulatory Visit: Payer: Self-pay | Admitting: Psychiatry

## 2023-08-29 DIAGNOSIS — E663 Overweight: Secondary | ICD-10-CM | POA: Diagnosis not present

## 2023-08-29 DIAGNOSIS — M5416 Radiculopathy, lumbar region: Secondary | ICD-10-CM | POA: Diagnosis not present

## 2023-08-29 DIAGNOSIS — R7303 Prediabetes: Secondary | ICD-10-CM | POA: Diagnosis not present

## 2023-08-29 DIAGNOSIS — Z79899 Other long term (current) drug therapy: Secondary | ICD-10-CM | POA: Diagnosis not present

## 2023-08-29 DIAGNOSIS — Z9181 History of falling: Secondary | ICD-10-CM | POA: Diagnosis not present

## 2023-08-29 DIAGNOSIS — E559 Vitamin D deficiency, unspecified: Secondary | ICD-10-CM | POA: Diagnosis not present

## 2023-08-30 DIAGNOSIS — Z79899 Other long term (current) drug therapy: Secondary | ICD-10-CM | POA: Diagnosis not present

## 2023-09-16 ENCOUNTER — Other Ambulatory Visit: Payer: Self-pay | Admitting: Psychiatry

## 2023-09-16 NOTE — Telephone Encounter (Signed)
Please reach out to schedule a visit; a video appointment is fine. If she is unavailable by phone, please document accordingly. Thanks.

## 2023-09-20 NOTE — Telephone Encounter (Signed)
Unable to reach patient with number provided. MyChart message sent

## 2024-01-15 ENCOUNTER — Other Ambulatory Visit: Payer: Self-pay | Admitting: Psychiatry
